# Patient Record
Sex: Female | Born: 1940
Health system: Southern US, Community
[De-identification: ages and names within clinical notes are randomized; demographics above are authoritative.]

## PROBLEM LIST (undated history)

## (undated) DIAGNOSIS — M1990 Unspecified osteoarthritis, unspecified site: Secondary | ICD-10-CM

## (undated) DIAGNOSIS — F32A Depression, unspecified: Secondary | ICD-10-CM

## (undated) DIAGNOSIS — F329 Major depressive disorder, single episode, unspecified: Secondary | ICD-10-CM

## (undated) DIAGNOSIS — I10 Essential (primary) hypertension: Secondary | ICD-10-CM

## (undated) DIAGNOSIS — K219 Gastro-esophageal reflux disease without esophagitis: Secondary | ICD-10-CM

## (undated) DIAGNOSIS — K602 Anal fissure, unspecified: Secondary | ICD-10-CM

## (undated) DIAGNOSIS — K5790 Diverticulosis of intestine, part unspecified, without perforation or abscess without bleeding: Secondary | ICD-10-CM

## (undated) DIAGNOSIS — M797 Fibromyalgia: Secondary | ICD-10-CM

## (undated) DIAGNOSIS — E669 Obesity, unspecified: Secondary | ICD-10-CM

## (undated) HISTORY — DX: Unspecified osteoarthritis, unspecified site: M19.90

## (undated) HISTORY — DX: Fibromyalgia: M79.7

## (undated) HISTORY — DX: Major depressive disorder, single episode, unspecified: F32.9

## (undated) HISTORY — DX: Obesity, unspecified: E66.9

## (undated) HISTORY — DX: Anal fissure, unspecified: K60.2

## (undated) HISTORY — DX: Essential (primary) hypertension: I10

## (undated) HISTORY — DX: Gastro-esophageal reflux disease without esophagitis: K21.9

## (undated) HISTORY — DX: Depression, unspecified: F32.A

## (undated) HISTORY — DX: Diverticulosis of intestine, part unspecified, without perforation or abscess without bleeding: K57.90

## (undated) HISTORY — PX: CATARACT EXTRACTION, BILATERAL: SHX1313

---

## 1968-10-10 HISTORY — PX: TUBAL LIGATION: SHX77

## 1968-10-10 HISTORY — PX: ABDOMINAL HYSTERECTOMY: SHX81

## 2002-12-18 ENCOUNTER — Encounter: Payer: Self-pay | Admitting: Internal Medicine

## 2003-12-19 ENCOUNTER — Encounter: Payer: Self-pay | Admitting: Internal Medicine

## 2003-12-19 LAB — CONVERTED CEMR LAB: Pap Smear: NORMAL

## 2004-05-10 HISTORY — PX: CHOLECYSTECTOMY: SHX55

## 2005-04-08 ENCOUNTER — Ambulatory Visit: Payer: Self-pay | Admitting: Internal Medicine

## 2005-04-28 ENCOUNTER — Ambulatory Visit: Payer: Self-pay | Admitting: Internal Medicine

## 2005-06-29 ENCOUNTER — Ambulatory Visit: Payer: Self-pay | Admitting: Internal Medicine

## 2005-11-05 ENCOUNTER — Ambulatory Visit: Payer: Self-pay | Admitting: Internal Medicine

## 2006-03-15 ENCOUNTER — Ambulatory Visit: Payer: Self-pay | Admitting: Internal Medicine

## 2006-05-13 ENCOUNTER — Ambulatory Visit: Payer: Self-pay | Admitting: Internal Medicine

## 2006-05-13 LAB — CONVERTED CEMR LAB: Hgb A1c MFr Bld: 7.8 % — ABNORMAL HIGH (ref 4.6–6.0)

## 2006-05-14 ENCOUNTER — Ambulatory Visit: Payer: Self-pay | Admitting: Internal Medicine

## 2006-07-16 ENCOUNTER — Telehealth (INDEPENDENT_AMBULATORY_CARE_PROVIDER_SITE_OTHER): Payer: Self-pay | Admitting: *Deleted

## 2006-07-19 ENCOUNTER — Telehealth (INDEPENDENT_AMBULATORY_CARE_PROVIDER_SITE_OTHER): Payer: Self-pay | Admitting: *Deleted

## 2006-08-10 ENCOUNTER — Telehealth: Payer: Self-pay | Admitting: Internal Medicine

## 2006-09-01 ENCOUNTER — Encounter: Payer: Self-pay | Admitting: Internal Medicine

## 2006-09-08 ENCOUNTER — Telehealth (INDEPENDENT_AMBULATORY_CARE_PROVIDER_SITE_OTHER): Payer: Self-pay | Admitting: *Deleted

## 2006-09-08 DIAGNOSIS — K439 Ventral hernia without obstruction or gangrene: Secondary | ICD-10-CM | POA: Insufficient documentation

## 2006-09-28 ENCOUNTER — Encounter: Payer: Self-pay | Admitting: Internal Medicine

## 2006-10-11 HISTORY — PX: VENTRAL HERNIA REPAIR: SHX424

## 2006-10-26 ENCOUNTER — Encounter: Payer: Self-pay | Admitting: Internal Medicine

## 2006-10-27 ENCOUNTER — Ambulatory Visit (HOSPITAL_COMMUNITY): Admission: RE | Admit: 2006-10-27 | Discharge: 2006-10-28 | Payer: Self-pay | Admitting: Surgery

## 2006-10-27 DIAGNOSIS — I10 Essential (primary) hypertension: Secondary | ICD-10-CM | POA: Insufficient documentation

## 2006-10-27 DIAGNOSIS — J45909 Unspecified asthma, uncomplicated: Secondary | ICD-10-CM | POA: Insufficient documentation

## 2006-11-12 ENCOUNTER — Telehealth (INDEPENDENT_AMBULATORY_CARE_PROVIDER_SITE_OTHER): Payer: Self-pay | Admitting: *Deleted

## 2006-12-13 ENCOUNTER — Ambulatory Visit: Payer: Self-pay | Admitting: Internal Medicine

## 2006-12-14 LAB — CONVERTED CEMR LAB
Albumin: 3.8 g/dL (ref 3.5–5.2)
BUN: 15 mg/dL (ref 6–23)
Basophils Absolute: 0.1 10*3/uL (ref 0.0–0.1)
Basophils Relative: 0.8 % (ref 0.0–1.0)
CO2: 28 meq/L (ref 19–32)
Calcium: 9.3 mg/dL (ref 8.4–10.5)
Chloride: 100 meq/L (ref 96–112)
Creatinine, Ser: 0.8 mg/dL (ref 0.4–1.2)
Eosinophils Absolute: 0.2 10*3/uL (ref 0.0–0.6)
Eosinophils Relative: 2.7 % (ref 0.0–5.0)
GFR calc Af Amer: 93 mL/min
GFR calc non Af Amer: 77 mL/min
Glucose, Bld: 139 mg/dL — ABNORMAL HIGH (ref 70–99)
HCT: 34.8 % — ABNORMAL LOW (ref 36.0–46.0)
Hemoglobin: 12 g/dL (ref 12.0–15.0)
Hgb A1c MFr Bld: 7.6 % — ABNORMAL HIGH (ref 4.6–6.0)
Lymphocytes Relative: 23.8 % (ref 12.0–46.0)
MCHC: 34.5 g/dL (ref 30.0–36.0)
MCV: 88.6 fL (ref 78.0–100.0)
Monocytes Absolute: 0.6 10*3/uL (ref 0.2–0.7)
Monocytes Relative: 7.3 % (ref 3.0–11.0)
Neutro Abs: 5 10*3/uL (ref 1.4–7.7)
Neutrophils Relative %: 65.4 % (ref 43.0–77.0)
Phosphorus: 3.4 mg/dL (ref 2.3–4.6)
Platelets: 232 10*3/uL (ref 150–400)
Potassium: 4.1 meq/L (ref 3.5–5.1)
RBC: 3.93 M/uL (ref 3.87–5.11)
RDW: 13.5 % (ref 11.5–14.6)
Sodium: 137 meq/L (ref 135–145)
TSH: 2.1 microintl units/mL (ref 0.35–5.50)
WBC: 7.8 10*3/uL (ref 4.5–10.5)

## 2007-01-18 ENCOUNTER — Ambulatory Visit: Payer: Self-pay | Admitting: Family Medicine

## 2007-01-18 ENCOUNTER — Telehealth: Payer: Self-pay | Admitting: Internal Medicine

## 2007-02-18 ENCOUNTER — Telehealth (INDEPENDENT_AMBULATORY_CARE_PROVIDER_SITE_OTHER): Payer: Self-pay | Admitting: *Deleted

## 2007-02-22 ENCOUNTER — Encounter: Payer: Self-pay | Admitting: Internal Medicine

## 2007-03-09 ENCOUNTER — Encounter (INDEPENDENT_AMBULATORY_CARE_PROVIDER_SITE_OTHER): Payer: Self-pay | Admitting: *Deleted

## 2007-03-10 ENCOUNTER — Ambulatory Visit: Payer: Self-pay | Admitting: Internal Medicine

## 2007-05-30 ENCOUNTER — Telehealth (INDEPENDENT_AMBULATORY_CARE_PROVIDER_SITE_OTHER): Payer: Self-pay | Admitting: *Deleted

## 2007-06-20 ENCOUNTER — Ambulatory Visit: Payer: Self-pay | Admitting: Internal Medicine

## 2007-06-20 DIAGNOSIS — F39 Unspecified mood [affective] disorder: Secondary | ICD-10-CM | POA: Insufficient documentation

## 2007-06-21 LAB — CONVERTED CEMR LAB
Calcium: 9.2 mg/dL (ref 8.4–10.5)
Chloride: 107 meq/L (ref 96–112)
Eosinophils Absolute: 0.2 10*3/uL (ref 0.0–0.7)
GFR calc Af Amer: 81 mL/min
GFR calc non Af Amer: 67 mL/min
HCT: 33.4 % — ABNORMAL LOW (ref 36.0–46.0)
Hgb A1c MFr Bld: 7.8 % — ABNORMAL HIGH (ref 4.6–6.0)
MCV: 88.7 fL (ref 78.0–100.0)
Monocytes Absolute: 0.4 10*3/uL (ref 0.1–1.0)
Monocytes Relative: 5.8 % (ref 3.0–12.0)
Neutrophils Relative %: 64.4 % (ref 43.0–77.0)
Phosphorus: 2.9 mg/dL (ref 2.3–4.6)
Platelets: 205 10*3/uL (ref 150–400)
Potassium: 3.8 meq/L (ref 3.5–5.1)
RDW: 13.9 % (ref 11.5–14.6)
Sodium: 139 meq/L (ref 135–145)
TSH: 2.22 microintl units/mL (ref 0.35–5.50)
WBC: 6.8 10*3/uL (ref 4.5–10.5)

## 2007-07-12 ENCOUNTER — Telehealth (INDEPENDENT_AMBULATORY_CARE_PROVIDER_SITE_OTHER): Payer: Self-pay | Admitting: *Deleted

## 2007-07-25 ENCOUNTER — Ambulatory Visit: Payer: Self-pay | Admitting: Internal Medicine

## 2007-07-28 ENCOUNTER — Telehealth (INDEPENDENT_AMBULATORY_CARE_PROVIDER_SITE_OTHER): Payer: Self-pay | Admitting: *Deleted

## 2007-07-28 ENCOUNTER — Encounter: Payer: Self-pay | Admitting: Internal Medicine

## 2007-11-01 ENCOUNTER — Encounter: Payer: Self-pay | Admitting: Internal Medicine

## 2007-11-08 ENCOUNTER — Telehealth: Payer: Self-pay | Admitting: Internal Medicine

## 2007-11-18 ENCOUNTER — Ambulatory Visit: Payer: Self-pay | Admitting: Internal Medicine

## 2007-11-21 LAB — CONVERTED CEMR LAB
Albumin: 3.7 g/dL (ref 3.5–5.2)
Basophils Absolute: 0 10*3/uL (ref 0.0–0.1)
CO2: 26 meq/L (ref 19–32)
Chloride: 101 meq/L (ref 96–112)
GFR calc Af Amer: 81 mL/min
GFR calc non Af Amer: 67 mL/min
Hgb A1c MFr Bld: 8.6 % — ABNORMAL HIGH (ref 4.6–6.0)
Lymphocytes Relative: 22.5 % (ref 12.0–46.0)
MCHC: 33.7 g/dL (ref 30.0–36.0)
Neutro Abs: 4.7 10*3/uL (ref 1.4–7.7)
Neutrophils Relative %: 67.1 % (ref 43.0–77.0)
Platelets: 194 10*3/uL (ref 150–400)
Potassium: 4 meq/L (ref 3.5–5.1)
RDW: 13.4 % (ref 11.5–14.6)
Sodium: 136 meq/L (ref 135–145)

## 2007-12-11 DIAGNOSIS — K5732 Diverticulitis of large intestine without perforation or abscess without bleeding: Secondary | ICD-10-CM

## 2007-12-15 ENCOUNTER — Ambulatory Visit: Payer: Self-pay | Admitting: Gastroenterology

## 2007-12-26 ENCOUNTER — Ambulatory Visit: Payer: Self-pay | Admitting: Gastroenterology

## 2007-12-27 LAB — CONVERTED CEMR LAB
Saturation Ratios: 15.5 % — ABNORMAL LOW (ref 20.0–50.0)
Vitamin B-12: 201 pg/mL — ABNORMAL LOW (ref 211–911)

## 2008-01-03 ENCOUNTER — Ambulatory Visit: Payer: Self-pay | Admitting: Gastroenterology

## 2008-01-03 LAB — HM COLONOSCOPY

## 2008-01-18 ENCOUNTER — Telehealth (INDEPENDENT_AMBULATORY_CARE_PROVIDER_SITE_OTHER): Payer: Self-pay | Admitting: *Deleted

## 2008-01-25 ENCOUNTER — Telehealth: Payer: Self-pay | Admitting: Internal Medicine

## 2008-02-13 ENCOUNTER — Encounter: Payer: Self-pay | Admitting: Internal Medicine

## 2008-05-18 ENCOUNTER — Ambulatory Visit: Payer: Self-pay | Admitting: Internal Medicine

## 2008-05-18 DIAGNOSIS — C4490 Unspecified malignant neoplasm of skin, unspecified: Secondary | ICD-10-CM

## 2008-05-18 DIAGNOSIS — J309 Allergic rhinitis, unspecified: Secondary | ICD-10-CM | POA: Insufficient documentation

## 2008-05-22 LAB — CONVERTED CEMR LAB
Basophils Relative: 0.3 % (ref 0.0–3.0)
Creatinine,U: 206 mg/dL
GFR calc non Af Amer: 58.83 mL/min (ref >60)
Glucose, Bld: 157 mg/dL — ABNORMAL HIGH (ref 70–99)
Hemoglobin: 11.5 g/dL — ABNORMAL LOW (ref 12.0–15.0)
Lymphocytes Relative: 20.9 % (ref 12.0–46.0)
MCHC: 34.3 g/dL (ref 30.0–36.0)
Microalb Creat Ratio: 3.9 mg/g (ref 0.0–30.0)
Monocytes Relative: 6.7 % (ref 3.0–12.0)
Neutro Abs: 5.5 10*3/uL (ref 1.4–7.7)
Potassium: 3.8 meq/L (ref 3.5–5.1)
RBC: 3.86 M/uL — ABNORMAL LOW (ref 3.87–5.11)
Sodium: 141 meq/L (ref 135–145)
TSH: 1.98 microintl units/mL (ref 0.35–5.50)

## 2008-05-23 ENCOUNTER — Telehealth: Payer: Self-pay | Admitting: Internal Medicine

## 2008-07-12 ENCOUNTER — Telehealth: Payer: Self-pay | Admitting: Internal Medicine

## 2008-07-18 ENCOUNTER — Encounter: Payer: Self-pay | Admitting: Internal Medicine

## 2008-08-28 ENCOUNTER — Ambulatory Visit: Payer: Self-pay | Admitting: Family Medicine

## 2008-10-11 ENCOUNTER — Encounter: Payer: Self-pay | Admitting: Internal Medicine

## 2008-11-13 ENCOUNTER — Ambulatory Visit: Payer: Self-pay | Admitting: Internal Medicine

## 2008-11-14 LAB — CONVERTED CEMR LAB
AST: 32 units/L (ref 0–37)
Albumin: 3.6 g/dL (ref 3.5–5.2)
BUN: 18 mg/dL (ref 6–23)
Basophils Absolute: 0 10*3/uL (ref 0.0–0.1)
Basophils Relative: 0.5 % (ref 0.0–3.0)
CO2: 28 meq/L (ref 19–32)
Chloride: 106 meq/L (ref 96–112)
Cholesterol: 142 mg/dL (ref 0–200)
GFR calc non Af Amer: 66.34 mL/min (ref >60)
HCT: 33 % — ABNORMAL LOW (ref 36.0–46.0)
Hemoglobin: 11.1 g/dL — ABNORMAL LOW (ref 12.0–15.0)
Lymphs Abs: 1.7 10*3/uL (ref 0.7–4.0)
MCHC: 33.5 g/dL (ref 30.0–36.0)
Monocytes Relative: 7.6 % (ref 3.0–12.0)
Neutro Abs: 4.2 10*3/uL (ref 1.4–7.7)
RDW: 14.7 % — ABNORMAL HIGH (ref 11.5–14.6)
Total Protein: 7.5 g/dL (ref 6.0–8.3)
Triglycerides: 144 mg/dL (ref 0.0–149.0)

## 2009-02-13 ENCOUNTER — Ambulatory Visit: Payer: Self-pay | Admitting: Family Medicine

## 2009-02-13 DIAGNOSIS — K219 Gastro-esophageal reflux disease without esophagitis: Secondary | ICD-10-CM | POA: Insufficient documentation

## 2009-03-20 ENCOUNTER — Ambulatory Visit: Payer: Self-pay | Admitting: Internal Medicine

## 2009-03-21 ENCOUNTER — Telehealth: Payer: Self-pay | Admitting: Internal Medicine

## 2009-03-22 ENCOUNTER — Encounter: Payer: Self-pay | Admitting: Internal Medicine

## 2009-03-23 ENCOUNTER — Encounter: Payer: Self-pay | Admitting: Internal Medicine

## 2009-05-20 ENCOUNTER — Ambulatory Visit: Payer: Self-pay | Admitting: Internal Medicine

## 2009-06-07 ENCOUNTER — Telehealth: Payer: Self-pay | Admitting: Internal Medicine

## 2009-07-18 ENCOUNTER — Telehealth: Payer: Self-pay | Admitting: Internal Medicine

## 2009-07-30 ENCOUNTER — Encounter: Payer: Self-pay | Admitting: Internal Medicine

## 2009-08-15 ENCOUNTER — Ambulatory Visit: Payer: Self-pay | Admitting: Internal Medicine

## 2009-08-15 LAB — HM DIABETES EYE EXAM

## 2009-08-19 LAB — CONVERTED CEMR LAB
ALT: 30 units/L (ref 0–35)
Albumin: 4 g/dL (ref 3.5–5.2)
BUN: 21 mg/dL (ref 6–23)
Basophils Relative: 0.3 % (ref 0.0–3.0)
CO2: 28 meq/L (ref 19–32)
Calcium: 9.3 mg/dL (ref 8.4–10.5)
Chloride: 104 meq/L (ref 96–112)
Eosinophils Relative: 1.2 % (ref 0.0–5.0)
Hgb A1c MFr Bld: 7.3 % — ABNORMAL HIGH (ref 4.6–6.5)
Lymphocytes Relative: 18.8 % (ref 12.0–46.0)
MCV: 91 fL (ref 78.0–100.0)
Monocytes Relative: 4.5 % (ref 3.0–12.0)
Neutrophils Relative %: 75.2 % (ref 43.0–77.0)
Platelets: 219 10*3/uL (ref 150.0–400.0)
Potassium: 4.3 meq/L (ref 3.5–5.1)
RBC: 3.5 M/uL — ABNORMAL LOW (ref 3.87–5.11)
Sodium: 142 meq/L (ref 135–145)
Total Bilirubin: 0.3 mg/dL (ref 0.3–1.2)
Total Protein: 8 g/dL (ref 6.0–8.3)
Vitamin B-12: >1500 pg/mL — ABNORMAL HIGH (ref 211–911)
WBC: 8.9 10*3/uL (ref 4.5–10.5)

## 2009-10-17 ENCOUNTER — Ambulatory Visit: Payer: Self-pay | Admitting: Internal Medicine

## 2010-02-13 ENCOUNTER — Ambulatory Visit
Admission: RE | Admit: 2010-02-13 | Discharge: 2010-02-13 | Payer: Self-pay | Source: Home / Self Care | Attending: Family Medicine | Admitting: Family Medicine

## 2010-02-21 ENCOUNTER — Ambulatory Visit
Admission: RE | Admit: 2010-02-21 | Discharge: 2010-02-21 | Payer: Self-pay | Source: Home / Self Care | Attending: Internal Medicine | Admitting: Internal Medicine

## 2010-02-21 ENCOUNTER — Other Ambulatory Visit: Payer: Self-pay | Admitting: Internal Medicine

## 2010-02-21 LAB — RENAL FUNCTION PANEL
Albumin: 3.6 g/dL (ref 3.5–5.2)
BUN: 21 mg/dL (ref 6–23)
CO2: 25 mEq/L (ref 19–32)
Calcium: 9.4 mg/dL (ref 8.4–10.5)
Chloride: 99 mEq/L (ref 96–112)
Creatinine, Ser: 1 mg/dL (ref 0.4–1.2)
GFR: 58.52 mL/min — ABNORMAL LOW (ref >60.00)
Glucose, Bld: 247 mg/dL — ABNORMAL HIGH (ref 70–99)
Phosphorus: 3.1 mg/dL (ref 2.3–4.6)
Potassium: 4.1 mEq/L (ref 3.5–5.1)
Sodium: 136 mEq/L (ref 135–145)

## 2010-02-21 LAB — CBC WITH DIFFERENTIAL/PLATELET
Basophils Absolute: 0 10*3/uL (ref 0.0–0.1)
Basophils Relative: 0.5 % (ref 0.0–3.0)
Eosinophils Absolute: 0.2 10*3/uL (ref 0.0–0.7)
Eosinophils Relative: 2.2 % (ref 0.0–5.0)
HCT: 31 % — ABNORMAL LOW (ref 36.0–46.0)
Hemoglobin: 10.4 g/dL — ABNORMAL LOW (ref 12.0–15.0)
Lymphocytes Relative: 19.1 % (ref 12.0–46.0)
Lymphs Abs: 1.5 10*3/uL (ref 0.7–4.0)
MCHC: 33.7 g/dL (ref 30.0–36.0)
MCV: 88.4 fl (ref 78.0–100.0)
Monocytes Absolute: 0.4 10*3/uL (ref 0.1–1.0)
Monocytes Relative: 5.4 % (ref 3.0–12.0)
Neutro Abs: 5.8 10*3/uL (ref 1.4–7.7)
Neutrophils Relative %: 72.8 % (ref 43.0–77.0)
Platelets: 204 10*3/uL (ref 150.0–400.0)
RBC: 3.5 Mil/uL — ABNORMAL LOW (ref 3.87–5.11)
RDW: 15.7 % — ABNORMAL HIGH (ref 11.5–14.6)
WBC: 8 10*3/uL (ref 4.5–10.5)

## 2010-02-21 LAB — HEPATIC FUNCTION PANEL
ALT: 25 U/L (ref 0–35)
AST: 28 U/L (ref 0–37)
Albumin: 3.6 g/dL (ref 3.5–5.2)
Alkaline Phosphatase: 64 U/L (ref 39–117)
Bilirubin, Direct: 0.1 mg/dL (ref 0.0–0.3)
Total Bilirubin: 0.4 mg/dL (ref 0.3–1.2)
Total Protein: 7.4 g/dL (ref 6.0–8.3)

## 2010-02-21 LAB — TSH: TSH: 1.84 u[IU]/mL (ref 0.35–5.50)

## 2010-02-21 LAB — HEMOGLOBIN A1C: Hgb A1c MFr Bld: 8.1 % — ABNORMAL HIGH (ref 4.6–6.5)

## 2010-02-25 ENCOUNTER — Telehealth: Payer: Self-pay | Admitting: Internal Medicine

## 2010-02-25 DIAGNOSIS — N814 Uterovaginal prolapse, unspecified: Secondary | ICD-10-CM | POA: Insufficient documentation

## 2010-03-11 NOTE — Medication Information (Signed)
Summary: Approval for Celebrex/[employer]  Approval for Celebrex/[employer]   Imported By: Lanelle Bal 03/29/2009 12:51:39  _____________________________________________________________________  External Attachment:    Type:   Image     Comment:   External Document

## 2010-03-11 NOTE — Progress Notes (Signed)
Summary: regarding ogen dose  Phone Note Call from Patient Call back at Home Phone [phone]   Caller: Patient Call For: Cindee Salt MD Summary of Call: Pt has been on ogen .75 but when refill was sent in the other day it was for 1 mg and the pt is asking why the dose was changed.  I didnt see anything noted in her chart.  Please advise. Uses [facility] [address]. Initial call taken by: Lowella Petties CMA,  June 07, 2009 9:42 AM  Follow-up for Phone Call        appears that the med was changed in the chart ogen is estropipate and it comes in a 0.75mg  dose but the estradiol doesn't  Okay to change back to estropipate at 0.75mg  Remind her that she should take the lowest dose that keeps symptoms away----so she should skip some days if no problems (slowly wean down to the lowest effective dose) Follow-up by: Cindee Salt MD,  June 07, 2009 10:08 AM  Additional Follow-up for Phone Call Additional follow up Details #1::        Patient says that she just spend money buying a 3 month supply of the 1mg  tablets and she doubts the pharmacy will take them back.  What do you want her to do?  Please advise.  Linde Gillis CMA Duncan Dull)  June 07, 2009 12:46 PM   Okay to use those first, and then switch back if she would like. Either way, she should skip days and find the lowest dose that keeps her from having menopausal symptoms. I recommend cutting back 1 tab a week each month (ie--take it 6 days per week now, go down to 5 days per week in 1 month, the 4 days per week, etc) Cindee Salt MD  June 07, 2009 12:50 PM   Patient advised as instructed. Additional Follow-up by: Linde Gillis CMA Duncan Dull),  June 07, 2009 12:56 PM

## 2010-03-11 NOTE — Progress Notes (Signed)
Summary: Prior Authorization for Celebrex 200mg   Phone Note From Pharmacy Call back at [phone]   Call placed by: Linde Gillis CMA Duncan Dull),  March 21, 2009 11:24 AM Call placed to: [facility] Caller: [facility] [address] Call For: Dr. Alphonsus Sias  Summary of Call: Received faxed form from pharmacy stating that prior authorization is needed for Celebrex 200mg .  Called [employer] at [phone] and request forms through automated system.  Forms will be sent today.  Linde Gillis CMA Duncan Dull)  March 21, 2009 11:26 AM   Received forms, forms in your IN box.   Initial call taken by: Linde Gillis CMA Duncan Dull),  March 21, 2009 4:17 PM  Follow-up for Phone Call        form done Please fill in rest and send Follow-up by: Cindee Salt MD,  March 22, 2009 8:01 AM  Additional Follow-up for Phone Call Additional follow up Details #1::        forms sent back to [facility] Additional Follow-up by: Mervin Hack CMA Duncan Dull),  March 22, 2009 9:09 AM

## 2010-03-11 NOTE — Progress Notes (Signed)
Summary: refill request for zanaflex  Phone Note Refill Request Call back at Home Phone [phone] Message from:  Patient  Refills Requested: Medication #1:  ZANAFLEX 4 MG  CAPS Take one by mouth at bedtime as needed Please send to [facility] [address].  She usually gets this [place] but she has run out.  Initial call taken by: Lowella Petties CMA,  July 18, 2009 12:20 PM  Follow-up for Phone Call        okay to send #90 x 0 Follow-up by: Cindee Salt MD,  July 18, 2009 12:59 PM  Additional Follow-up for Phone Call Additional follow up Details #1::        Rx faxed to pharmacy, Spoke with patient and advised results.  Additional Follow-up by: Mervin Hack CMA Duncan Dull),  July 18, 2009 2:53 PM    Prescriptions: ZANAFLEX 4 MG  CAPS (TIZANIDINE HCL) Take one by mouth at bedtime as needed  #90 x 0   Entered by:   Mervin Hack CMA (AAMA)   Authorized by:   Cindee Salt MD   Signed by:   Mervin Hack CMA (AAMA) on 07/18/2009   Method used:   Electronically to        [facility] [address] (retail)       [address]       [place], [place]  [address]       Ph: [phone]       Fax: [phone]   RxID:   [id number]

## 2010-03-11 NOTE — Assessment & Plan Note (Signed)
Summary: 1 MONTH FOLLOW UP PER BILLIE/[facility]   Vital Signs:  Patient profile:   70 year old female Weight:      227 pounds Temp:     98.5 degrees F oral Pulse rate:   60 / minute Pulse rhythm:   irregular BP sitting:   140 / 68  (left arm) Cuff size:   large  Vitals Entered By: Mervin Hack CMA Duncan Dull) (March 20, 2009 9:05 AM) CC: 1 month follow-up   History of Present Illness: Has changed her diet--eating better Lower abdominal pain is completely gone  Felt the nexium really helped more back on prilosec for the past 3 days and having some more symptoms concerned that she cannot afford the nexium  Eating Christy Gentles thinks this may have helped her bowels as well  No throat symptoms or voice changes mostly just substernal pain----mostly dependent on eating Not awakening with this pain  Allergies: 1)  * Lisinopril 2)  Prozac (Fluoxetine Hcl) 3)  Sertraline Hcl (Sertraline Hcl)  Past History:  Past medical, surgical, family and social histories (including risk factors) reviewed for relevance to current acute and chronic problems.  Past Medical History: Reviewed history from 05/18/2008 and no changes required. Asthma Diabetes mellitus, type II Hypertension Fibromyalgia Osteoarthritis Depression Anal Fissure Arthritis Obesity GERD Allergic rhinitis  Past Surgical History: Reviewed history from 12/15/2007 and no changes required. Cataracts- bilat Cholecystectomy 04/06 Hysterectomy 1970's Tubal ligation 1970's Ventral hernia repair 10/2006 ([facility]) DEXA- normal ( except osteopenia at [facility]) 11/04  Family History: Reviewed history from 12/15/2007 and no changes required. Father: Died at age 66, MI Mother: Died at age 14, kidney cancer 39 Brothers-- one deceased with lung cancer, one deceased with liver cancer, one living CAD in  Dad No HTN DM on  Mom's side No breast or colon cancer No FH of Colon Cancer:  Social History: Reviewed history  from 10/26/2006 and no changes required. Marital Status: Divorced Children: 3 Occupation: Homemaker Never Smoked Alcohol use-no  Review of Systems       weight is down about 2# since last visit Bowels are normal  Physical Exam  General:  alert and normal appearance.   Neck:  supple and no masses.   Lungs:  normal respiratory effort and normal breath sounds.   Heart:  normal rate, regular rhythm, no murmur, and no gallop.   Abdomen:  soft, non-tender, and no masses.     Impression & Recommendations:  Problem # 1:  GERD (ICD-530.81) Assessment Improved will increase the prilosec to two times a day  if that is not effective, will need to change to nexium  The following medications were removed from the medication list:    Prilosec 20 Mg Cpdr (Omeprazole) ..... One tablet by mouth once daily Her updated medication list for this problem includes:    Omeprazole 20 Mg Cpdr (Omeprazole) .Marland Kitchen... 1 tab by mouth two times a day for acid reflux  Complete Medication List: 1)  Cardura 8 Mg Tabs (Doxazosin mesylate) .... Take 1/2 tablet by mouth twice a day 2)  Metformin Hcl 1000 Mg Tabs (Metformin hcl) .Marland Kitchen.. 1 tablet by mouth twice a day 3)  Ogen 0.625 0.75 Mg Tabs (Estropipate) .... Take one by mouth once a day 4)  Zyrtec Allergy 10 Mg Tabs (Cetirizine hcl) .... Take one by mouth once a day as needed 5)  Zanaflex 4 Mg Caps (Tizanidine hcl) .... Take one by mouth at bedtime as needed 6)  Celebrex 200 Mg Caps (  Celecoxib) .... Take one by mouth once a day 7)  Aspirin 81 Mg Tabs (Aspirin) .... Take one by mouth once a day 8)  Hyzaar 100-25 Mg Tabs (Losartan potassium-hctz) .... Take one by mouth once a day 9)  Effexor Xr 75 Mg Cp24 (Venlafaxine hcl) .Marland Kitchen.. 1 daily or as directed 10)  Glipizide Xl 5 Mg Xr24h-tab (Glipizide) .... Take 1 tablet every morning befor breakfast 11)  Neurontin 100 Mg Caps (Gabapentin) .... Take 1 tablet by mouth two times a day 12)  Calcium 500 Mg Tabs (Calcium  carbonate) .... Take 1 by mouth once daily 13)  Potassium 75 Mg Tabs (Potassium) .... Take 1 by mouth once daily 14)  Vitamin D3 1000 Unit Tabs (Cholecalciferol) .... Take 1 by mouth once daily 15)  Vitamin B-12 500 Mcg Tabs (Cyanocobalamin) .... Take 1 by mouth once daily 16)  Omeprazole 20 Mg Cpdr (Omeprazole) .Marland Kitchen.. 1 tab by mouth two times a day for acid reflux  Patient Instructions: 1)  Please cancel March 1st appt 2)  Please schedule a follow-up appointment in 2 months.  Prescriptions: CELEBREX 200 MG  CAPS (CELECOXIB) Take one by mouth once a day  #30 x 1   Entered and Authorized by:   Cindee Salt MD   Signed by:   Cindee Salt MD on 03/20/2009   Method used:   Electronically to        [facility] [address] (retail)       [address]       [place], [place]  [address]       Ph: [phone]       Fax: [phone]   RxID:   [id number] OMEPRAZOLE 20 MG CPDR (OMEPRAZOLE) 1 tab by mouth two times a day for acid reflux  #60 x 11   Entered and Authorized by:   Cindee Salt MD   Signed by:   Cindee Salt MD on 03/20/2009   Method used:   Electronically to        [facility] [address] (retail)       [address]       [place], [place]  [address]       Ph: [phone]       Fax: [phone]   RxID:   [id number]   Current Allergies (reviewed today): * LISINOPRIL PROZAC (FLUOXETINE HCL) SERTRALINE HCL (SERTRALINE HCL)

## 2010-03-11 NOTE — Medication Information (Signed)
Summary: Prescriber Response Form/[employer]  Prescriber Response Form/[employer]   Imported By: Lester  08/08/2009 11:48:54  _____________________________________________________________________  External Attachment:    Type:   Image     Comment:   External Document

## 2010-03-11 NOTE — Medication Information (Signed)
Summary: Prior Authorization for Celebrex/[employer]  Prior Authorization for Celebrex/[employer]   Imported By: Lanelle Bal 03/25/2009 10:34:17  _____________________________________________________________________  External Attachment:    Type:   Image     Comment:   External Document  Appended Document: Prior Authorization for Celebrex/[employer] Spoke with Robin at [facility], Celebrex 200mg  approved from 03/22/2009 through 03/23/2011.  Pharmacy and patient notified.

## 2010-03-11 NOTE — Assessment & Plan Note (Signed)
Summary: ACID REFLUX/DS   Vital Signs:  Patient profile:   70 year old female Height:      64 inches Weight:      229.50 pounds BMI:     39.54 Temp:     98.4 degrees F oral Pulse rate:   72 / minute Pulse rhythm:   irregular BP sitting:   126 / 84  (left arm) Cuff size:   large  Vitals Entered By: Lewanda Rife LPN (February 13, 2009 9:17 AM)  Primary Care Provider:  Nada Libman  CC:  acid reflux and upper abdominal pain.  History of Present Illness: Here for discomfort in upper abd  and reflux signs--onset x 1+ wk--sudden onset in lower abd and now sore in lateral sides and to back--indigestion started few days ago, not there at first --taking prilosec 20 once daily--not helping any more --BMs nl --Colonoscopy 11/09--diverticulosis, little knowledge of this entity or how to treat if flair  Allergies: 1)  * Lisinopril 2)  Prozac (Fluoxetine Hcl) 3)  Sertraline Hcl (Sertraline Hcl)  Past History:  Past Medical History: Reviewed history from 05/18/2008 and no changes required. Asthma Diabetes mellitus, type II Hypertension Fibromyalgia Osteoarthritis Depression Anal Fissure Arthritis Obesity GERD Allergic rhinitis  Review of Systems CV:  Denies chest pain or discomfort and palpitations. Resp:  Denies cough, shortness of breath, and wheezing. GI:  Complains of abdominal pain and indigestion; denies bloody stools, change in bowel habits, constipation, diarrhea, nausea, and vomiting.  Physical Exam  General:  alert, well-developed, well-nourished, well-hydrated, and overweight-appearing.  NAD Lungs:  normal respiratory effort, no intercostal retractions, no accessory muscle use, and normal breath sounds.   Abdomen:  soft, normal bowel sounds, no distention, no masses, no guarding, no rebound tenderness, no abdominal hernia, no inguinal hernia, no hepatomegaly, and no splenomegaly.   generalized tenderness throughout abd except over suprapubic area Inguinal  Nodes:  no R inguinal adenopathy and no L inguinal adenopathy.   Psych:  normally interactive and good eye contact.     Impression & Recommendations:  Problem # 1:  ABDOMINAL PAIN, GENERALIZED (ICD-789.07) Assessment New with review of colonoscopy find thst she has diverticulosis and that she has been eating more nuts over Holiday than usual as not acute, will not treat, asked to avoid large amts of nuts, seeds and foods with kernels--discussed see back if not improved to resolved in 1 wk  Problem # 2:  GERD (ICD-530.81) Assessment: Deteriorated not controlled now on prilosec--will try on Nexium, gave samples x30mo and reviewed how to take to see Dr Alphonsus Sias in 1 mo Her updated medication list for this problem includes:    Prilosec 20 Mg Cpdr (Omeprazole) ..... One tablet by mouth once daily  Problem # 3:  HYPERTENSION (ICD-401.9) Assessment: Unchanged stable on current meds--continue Her updated medication list for this problem includes:    Cardura 8 Mg Tabs (Doxazosin mesylate) .Marland Kitchen... Take 1/2 tablet by mouth twice a day    Hyzaar 100-25 Mg Tabs (Losartan potassium-hctz) .Marland Kitchen... Take one by mouth once a day  BP today: 126/84 Prior BP: 138/78 (11/13/2008)  Labs Reviewed: K+: 3.9 (11/13/2008) Creat: : 0.9 (11/13/2008)   Chol: 142 (11/13/2008)   HDL: 38.90 (11/13/2008)   LDL: 74 (11/13/2008)   TG: 144.0 (11/13/2008)  Complete Medication List: 1)  Cardura 8 Mg Tabs (Doxazosin mesylate) .... Take 1/2 tablet by mouth twice a day 2)  Metformin Hcl 1000 Mg Tabs (Metformin hcl) .Marland Kitchen.. 1 tablet by mouth twice a day  3)  Ogen 0.625 0.75 Mg Tabs (Estropipate) .... Take one by mouth once a day 4)  Zyrtec Allergy 10 Mg Tabs (Cetirizine hcl) .... Take one by mouth once a day as needed 5)  Zanaflex 4 Mg Caps (Tizanidine hcl) .... Take one by mouth at bedtime as needed 6)  Celebrex 200 Mg Caps (Celecoxib) .... Take one by mouth once a day 7)  Aspirin 81 Mg Tabs (Aspirin) .... Take one by mouth once a  day 8)  Hyzaar 100-25 Mg Tabs (Losartan potassium-hctz) .... Take one by mouth once a day 9)  Effexor Xr 75 Mg Cp24 (Venlafaxine hcl) .Marland Kitchen.. 1 daily or as directed 10)  Glipizide Xl 5 Mg Xr24h-tab (Glipizide) .... Take 1 tablet every morning befor breakfast 11)  Prilosec 20 Mg Cpdr (Omeprazole) .... One tablet by mouth once daily 12)  Neurontin 100 Mg Caps (Gabapentin) .... Take 1 tablet by mouth two times a day 13)  Calcium 500 Mg Tabs (Calcium carbonate) .... Take 1 by mouth once daily 14)  Potassium 75 Mg Tabs (Potassium) .... Take 1 by mouth once daily 15)  Vitamin D3 1000 Unit Tabs (Cholecalciferol) .... Take 1 by mouth once daily 16)  Vitamin B-12 500 Mcg Tabs (Cyanocobalamin) .... Take 1 by mouth once daily  Patient Instructions: 1)  Please schedule a follow-up appointment in 1 month--Dr Letvak.  Current Allergies (reviewed today): * LISINOPRIL PROZAC (FLUOXETINE HCL) SERTRALINE HCL (SERTRALINE HCL)

## 2010-03-11 NOTE — Assessment & Plan Note (Signed)
Summary: 2 M F/U DLO   Vital Signs:  Patient profile:   70 year old female Weight:      229 pounds Temp:     98 degrees F oral Pulse rate:   86 / minute Pulse rhythm:   regular BP sitting:   130 / 72  (left arm) Cuff size:   large  Vitals Entered By: Mervin Hack CMA Duncan Dull) (May 20, 2009 11:17 AM) CC: knee injection   History of Present Illness: ongoing left knee pain hard time walking daughter bedridden and doing more for her currently hospitalized for ongoing seizures  has gotten relief in past from injections last was 10/10  Allergies: 1)  * Lisinopril 2)  Prozac (Fluoxetine Hcl) 3)  Sertraline Hcl (Sertraline Hcl)  Past History:  Past medical, surgical, family and social histories (including risk factors) reviewed for relevance to current acute and chronic problems.  Past Medical History: Reviewed history from 05/18/2008 and no changes required. Asthma Diabetes mellitus, type II Hypertension Fibromyalgia Osteoarthritis Depression Anal Fissure Arthritis Obesity GERD Allergic rhinitis  Past Surgical History: Reviewed history from 12/15/2007 and no changes required. Cataracts- bilat Cholecystectomy 04/06 Hysterectomy 1970's Tubal ligation 1970's Ventral hernia repair 10/2006 ([facility]) DEXA- normal ( except osteopenia at [facility]) 11/04  Family History: Reviewed history from 12/15/2007 and no changes required. Father: Died at age 34, MI Mother: Died at age 65, kidney cancer 16 Brothers-- one deceased with lung cancer, one deceased with liver cancer, one living CAD in  Dad No HTN DM on  Mom's side No breast or colon cancer No FH of Colon Cancer:  Social History: Reviewed history from 10/26/2006 and no changes required. Marital Status: Divorced Children: 3 Occupation: Homemaker Never Smoked Alcohol use-no   Impression & Recommendations:  Problem # 1:  LOC OSTEOARTHROS NOT SPEC PRIM/SEC LOWER LEG (ICD-715.36) Assessment Comment  Only  procedure  Sterile prep medial approach to left knee 2cc plain 2% lido local 40mg  kenalog/8cc 2% lido instilled in left knee tolerated well with reduction in pain discussed home care  Orders: Joint Aspirate / Injection, Large (20610) Kenalog 10 mg inj (J3301)  Complete Medication List: 1)  Cardura 8 Mg Tabs (Doxazosin mesylate) .... Take 1/2 tablet by mouth twice a day 2)  Metformin Hcl 1000 Mg Tabs (Metformin hcl) .Marland Kitchen.. 1 tablet by mouth twice a day 3)  Ogen 0.625 0.75 Mg Tabs (Estropipate) .... Take one by mouth once a day 4)  Zyrtec Allergy 10 Mg Tabs (Cetirizine hcl) .... Take one by mouth once a day as needed 5)  Zanaflex 4 Mg Caps (Tizanidine hcl) .... Take one by mouth at bedtime as needed 6)  Celebrex 200 Mg Caps (Celecoxib) .... Take one by mouth once a day 7)  Aspirin 81 Mg Tabs (Aspirin) .... Take one by mouth once a day 8)  Hyzaar 100-25 Mg Tabs (Losartan potassium-hctz) .... Take one by mouth once a day 9)  Effexor Xr 75 Mg Cp24 (Venlafaxine hcl) .Marland Kitchen.. 1 daily or as directed 10)  Glipizide Xl 5 Mg Xr24h-tab (Glipizide) .... Take 1 tablet every morning befor breakfast 11)  Neurontin 100 Mg Caps (Gabapentin) .... Take 1 tablet by mouth two times a day 12)  Calcium 500 Mg Tabs (Calcium carbonate) .... Take 1 by mouth once daily 13)  Potassium 75 Mg Tabs (Potassium) .... Take 1 by mouth once daily 14)  Vitamin D3 1000 Unit Tabs (Cholecalciferol) .... Take 1 by mouth once daily 15)  Vitamin B-12 500 Mcg Tabs (  Cyanocobalamin) .... Take 1 by mouth once daily 16)  Omeprazole 20 Mg Cpdr (Omeprazole) .Marland Kitchen.. 1 tab by mouth two times a day for acid reflux  Patient Instructions: 1)  Please schedule a follow-up appointment in 2-3  months.   Current Allergies (reviewed today): * LISINOPRIL PROZAC (FLUOXETINE HCL) SERTRALINE HCL (SERTRALINE HCL)

## 2010-03-11 NOTE — Assessment & Plan Note (Signed)
Summary: 3 M F/U DLO   Vital Signs:  Patient profile:   70 year old female Height:      64 inches Weight:      228 pounds BMI:     39.28 Temp:     98.4 degrees F oral Pulse rate:   80 / minute Pulse rhythm:   regular BP sitting:   120 / 90  (left arm) Cuff size:   large  Vitals Entered By: Linde Gillis CMA [facility]) (August 15, 2009 3:34 PM) CC: 3 month follow up   History of Present Illness: Left knee did not improve after the last injection all joints are sore--esp the knee  feels she may have thyroid problems very fatigued sleeps a lot losing hair--long standing but worse lately  Stomach and swallowing have been better on the two times a day prilosec  still on hormones got higher dose by mistake afraid to wean due to fatigue  Thinks her sugars are fine hasn't been checking no hypoglycemic reactions had eye exam today  Trouble with right arm went to beach and sunburned healed but still itching underneath topical Rx no help  still uses the gabapentin for the burning in feet   Allergies: 1)  * Lisinopril 2)  Prozac (Fluoxetine Hcl) 3)  Sertraline Hcl (Sertraline Hcl)  Past History:  Past medical, surgical, family and social histories (including risk factors) reviewed for relevance to current acute and chronic problems.  Past Medical History: Reviewed history from 05/18/2008 and no changes required. Asthma Diabetes mellitus, type II Hypertension Fibromyalgia Osteoarthritis Depression Anal Fissure Arthritis Obesity GERD Allergic rhinitis  Past Surgical History: Reviewed history from 12/15/2007 and no changes required. Cataracts- bilat Cholecystectomy 04/06 Hysterectomy 1970's Tubal ligation 1970's Ventral hernia repair 10/2006 ([facility]) DEXA- normal ( except osteopenia at [facility]) 11/04  Family History: Reviewed history from 12/15/2007 and no changes required. Father: Died at age 42, MI Mother: Died at age 2, kidney cancer 20  Brothers-- one deceased with lung cancer, one deceased with liver cancer, one living CAD in  Dad No HTN DM on  Mom's side No breast or colon cancer No FH of Colon Cancer:  Social History: Reviewed history from 10/26/2006 and no changes required. 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   Neck:  supple, no masses, no thyromegaly, no carotid bruits, and no cervical lymphadenopathy.   Lungs:  normal respiratory effort and normal breath sounds.   Heart:  normal rate, regular rhythm, no murmur, and no gallop.   Abdomen:  soft, non-tender, and no masses.   Pulses:  2+ in feet Extremities:  no edema Skin:  non specific coarse rash on upper right arm--very slight Axillary Nodes:  No palpable lymphadenopathy Inguinal Nodes:  No significant adenopathy Psych:  normally interactive, good eye contact, not anxious appearing, and not depressed appearing.    Diabetes Management Exam:    Foot Exam (with socks and/or shoes not present):       Sensory-Pinprick/Light touch:          Left medial foot (L-4): normal          Left dorsal foot (L-5): normal          Left lateral foot (S-1): normal          Right  medial foot (L-4): normal          Right dorsal foot (L-5): normal          Right lateral foot (S-1): normal       Inspection:          Left foot: normal          Right foot: normal       Nails:          Left foot: normal          Right foot: normal    Eye Exam:       Eye Exam done elsewhere          Date: 08/15/2009          Results: no retinopathy          Done by: [facility]   Impression & Recommendations:  Problem # 1:  FATIGUE (ICD-780.79) Assessment New  non specific doubt hypothyroidism but it is possible will check labs try to wean estrogen  Orders: TLB-B12, Serum-Total ONLY  ([address]-U04) TLB-T4 (Thyrox), Free [phone]) TLB-TSH (Thyroid Stimulating Hormone) (84443-TSH) TLB-Hepatic/Liver Function Pnl (80076-HEPATIC) TLB-CBC Platelet - w/Differential (85025-CBCD) TLB-Renal Function Panel (80069-RENAL) Venipuncture (47829)  Problem # 2:  DIABETES MELLITUS, TYPE II WITH NEUROP (ICD-250.00) Assessment: Unchanged  not checking sugars will check labs  Her updated medication list for this problem includes:    Metformin Hcl 1000 Mg Tabs (Metformin hcl) .Marland Kitchen... 1 tablet by mouth twice a day    Aspirin 81 Mg Tabs (Aspirin) .Marland Kitchen... Take one by mouth once a day    Hyzaar 100-25 Mg Tabs (Losartan potassium-hctz) .Marland Kitchen... Take one by mouth once a day    Glipizide Xl 5 Mg Xr24h-tab (Glipizide) .Marland Kitchen... Take 1 tablet every morning befor breakfast  Labs Reviewed: Creat: 0.9 (11/13/2008)     Last Eye Exam: no retinopathy (08/15/2009) Reviewed HgBA1c results: 7.5 (11/13/2008)  7.0 (05/18/2008)  Orders: TLB-A1C / Hgb A1C (Glycohemoglobin) (83036-A1C)  Problem # 3:  GERD (ICD-530.81) Assessment: Improved better with omeprazole two times a day  Her updated medication list for this problem includes:    Omeprazole 20 Mg Cpdr (Omeprazole) .Marland Kitchen... 1 tab by mouth two times a day for acid reflux  Problem # 4:  DEPRESSION (ICD-311) Assessment: Unchanged still lots of stress with ill daughter, etc  Her updated medication list for this problem includes:    Effexor Xr 75 Mg Cp24 (Venlafaxine hcl) .Marland Kitchen... 1 daily or as directed  Problem # 5:  OSTEOARTHRITIS (ICD-715.90) Assessment: Unchanged ongoing Rx and pain ??fibromyalgia again  Her updated medication list for this problem includes:    Celebrex 200 Mg Caps (Celecoxib) .Marland Kitchen... Take one by mouth once a day    Aspirin 81 Mg Tabs (Aspirin) .Marland Kitchen... Take one by mouth once a day  Complete Medication List: 1)  Cardura 8 Mg Tabs (Doxazosin mesylate) .... Take 1/2 tablet by mouth twice a day 2)  Metformin Hcl 1000 Mg Tabs (Metformin hcl)  .Marland Kitchen.. 1 tablet by mouth twice a day 3)  Zyrtec Allergy 10 Mg Tabs (Cetirizine hcl) .... Take one by mouth once a day as needed 4)  Zanaflex 4 Mg Caps (Tizanidine hcl) .... Take one by mouth at bedtime as needed 5)  Celebrex 200 Mg Caps (Celecoxib) .... Take one by mouth once a day 6)  Aspirin 81 Mg Tabs (Aspirin) .... Take one by mouth once a day 7)  Hyzaar 100-25 Mg Tabs (Losartan potassium-hctz) .... Take one by mouth once a day 8)  Effexor Xr 75 Mg Cp24 (Venlafaxine hcl) .Marland Kitchen.. 1 daily or as directed 9)  Glipizide Xl 5 Mg Xr24h-tab (Glipizide) .... Take 1 tablet every morning befor breakfast 10)  Neurontin 100 Mg Caps (Gabapentin) .... Take 1 tablet by mouth two times a day 11)  Calcium 500 Mg Tabs (Calcium carbonate) .... Take 1 by mouth once daily 12)  Potassium 75 Mg Tabs (Potassium) .... Take 1 by mouth once daily 13)  Vitamin D3 1000 Unit Tabs (Cholecalciferol) .... Take 1 by mouth once daily 14)  Vitamin B-12 500 Mcg Tabs (Cyanocobalamin) .... Take 1 by mouth once daily 15)  Omeprazole 20 Mg Cpdr (Omeprazole) .Marland Kitchen.. 1 tab by mouth two times a day for acid reflux 16)  Estradiol 0.5 Mg Tabs (Estradiol) .Marland Kitchen.. 1 tab daily for menopausal symptoms. wean as able 17)  Triamcinolone Acetonide 0.1 % Crea (Triamcinolone acetonide) .... Apply to rash three times a day as needed till clear  Patient Instructions: 1)  Please schedule a follow-up appointment in 2 months.  Prescriptions: TRIAMCINOLONE ACETONIDE 0.1 % CREA (TRIAMCINOLONE ACETONIDE) apply to rash three times a day as needed till clear  #60gm x 1   Entered and Authorized by:   Cindee Salt MD   Signed by:   Cindee Salt MD on 08/15/2009   Method used:   Electronically to        [facility] [address] (retail)       [address]       [place], [place]  [address]       Ph: [phone]       Fax: [phone]   RxID:   [id number] ESTRADIOL 0.5 MG TABS (ESTRADIOL) 1 tab daily for  menopausal symptoms. Wean as able  #90 x 3   Entered and Authorized by:   Cindee Salt MD   Signed by:   Cindee Salt MD on 08/15/2009   Method used:   Electronically to        [facility] [address] (retail)       [address]       [place], [place]  [address]       Ph: [phone]       Fax: [phone]   RxID:   [id number]   Current Allergies (reviewed today): * LISINOPRIL PROZAC (FLUOXETINE HCL) SERTRALINE HCL (SERTRALINE HCL)

## 2010-03-11 NOTE — Assessment & Plan Note (Signed)
Summary: FOLLOW UP / LFW   Vital Signs:  Patient profile:   70 year old female Weight:      228 pounds Temp:     98.2 degrees F oral Pulse rate:   68 / minute Pulse rhythm:   regular BP sitting:   128 / 78  (left arm) Cuff size:   large  Vitals Entered By: Mervin Hack CMA [facility]) (October 17, 2009 3:34 PM) CC: 2 month follow-up   History of Present Illness: Doing better in general Fatigue is better Recent trip to the beach for a week--really enjoyed that  Mood has been generally okay Depression not an issue  Left knee is really bad has had improvement with cortisone injections in past would like one today  Allergies: 1)  * Lisinopril 2)  Prozac (Fluoxetine Hcl) 3)  Sertraline Hcl (Sertraline Hcl)  Past History:  Past medical, surgical, family and social histories (including risk factors) reviewed for relevance to current acute and chronic problems.  Past Medical History: Reviewed history from 05/18/2008 and no changes required. Asthma Diabetes mellitus, type II Hypertension Fibromyalgia Osteoarthritis Depression Anal Fissure Arthritis Obesity GERD Allergic rhinitis  Past Surgical History: Reviewed history from 12/15/2007 and no changes required. Cataracts- bilat Cholecystectomy 04/06 Hysterectomy 1970's Tubal ligation 1970's Ventral hernia repair 10/2006 ([facility]) DEXA- normal ( except osteopenia at [facility]) 11/04  Family History: Reviewed history from 12/15/2007 and no changes required. Father: Died at age 110, MI Mother: Died at age 23, kidney cancer 47 Brothers-- one deceased with lung cancer, one deceased with liver cancer, one living CAD in  Dad No HTN DM on  Mom's side No breast or colon cancer No FH of Colon Cancer:  Social History: Reviewed history from 10/26/2006 and no changes required. 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   Msk:  left knee is thickened but without effusion fair ROM but with some pain Psych:  normally interactive, good eye contact, not anxious appearing, and not depressed appearing.     Impression & Recommendations:  Problem # 1:  FATIGUE (ICD-780.79) Assessment Improved mood is fine no etiology but better so no changes needed  Problem # 2:  LOC OSTEOARTHROS NOT SPEC PRIM/SEC LOWER LEG (ICD-715.36) Assessment: Deteriorated  Procedure  Sterile prep medial    approach  ~1.5cc 2% lido for skin anaesthesia 40mg  kenalog and  ~7cc 2% plain lidocaine injected tolerated well discussed home care  Orders: Joint Aspirate / Injection, Large (30865) Kenalog 10 mg inj (J3301)  Complete Medication List: 1)  Cardura 8 Mg Tabs (Doxazosin mesylate) .... Take 1/2 tablet by mouth twice a day 2)  Metformin Hcl 1000 Mg Tabs (Metformin hcl) .Marland Kitchen.. 1 tablet by mouth twice a day 3)  Zanaflex 4 Mg Caps (Tizanidine hcl) .... Take one by mouth at bedtime as needed 4)  Celebrex 200 Mg Caps (Celecoxib) .... Take one by mouth once a day 5)  Hyzaar 100-25 Mg Tabs (Losartan potassium-hctz) .... Take one by mouth once a day 6)  Effexor Xr 75 Mg Cp24 (Venlafaxine hcl) .Marland Kitchen.. 1 daily or as directed 7)  Glipizide Xl 5 Mg Xr24h-tab (Glipizide) .... Take 1 tablet every morning befor breakfast 8)  Neurontin 100 Mg Caps (Gabapentin) .... Take 1 tablet by mouth two times a day 9)  Omeprazole 20 Mg Cpdr (Omeprazole) .Marland Kitchen.. 1 tab by mouth two times a day for acid reflux 10)  Estradiol 0.5 Mg Tabs (Estradiol) .Marland Kitchen.. 1 tab daily for menopausal symptoms. wean as able 11)  Triamcinolone Acetonide 0.1 % Crea (Triamcinolone acetonide) .... Apply to rash three times a day as needed till clear 12)  Zyrtec Allergy 10 Mg Tabs (Cetirizine hcl) .... Take one by mouth once a day as needed 13)  Aspirin 81 Mg Tabs (Aspirin) ....  Take one by mouth once a day 14)  Calcium 500 Mg Tabs (Calcium carbonate) .... Take 1 by mouth once daily 15)  Potassium 75 Mg Tabs (Potassium) .... Take 1 by mouth once daily 16)  Vitamin D3 1000 Unit Tabs (Cholecalciferol) .... Take 1 by mouth once daily 17)  Vitamin B-12 500 Mcg Tabs (Cyanocobalamin) .... Take 1 by mouth once daily  Other Orders: Flu Vaccine 40yrs + ([address]) Admin 1st Vaccine ([address]) Admin 1st Vaccine [facility]) [phone])  Patient Instructions: 1)  Please schedule a follow-up appointment in 4 months .   Current Allergies (reviewed today): * LISINOPRIL PROZAC (FLUOXETINE HCL) SERTRALINE HCL (SERTRALINE HCL)   Influenza Vaccine    Vaccine Type: Fluvax 3+    Site: left deltoid    Mfr: GlaxoSmithKline    Dose: 0.5 ml    Route: IM    Given by: Mervin Hack CMA (AAMA)    Exp. Date: 08/09/2010    Lot #: [id number]    VIS given: 09/03/09 version given October 17, 2009.  Flu Vaccine Consent Questions    Do you have a history of severe allergic reactions to this vaccine? no    Any prior history of allergic reactions to egg and/or gelatin? no    Do you have a sensitivity to the preservative Thimersol? no    Do you have a past history of Guillan-Barre Syndrome? no    Do you currently have an acute febrile illness? no    Have you ever had a severe reaction to latex? no    Vaccine information given and explained to patient? yes    Are you currently pregnant? no

## 2010-03-13 NOTE — Assessment & Plan Note (Signed)
Summary: COUGH,HA,BACKACHE/CLE   Vital Signs:  Patient profile:   70 year old female Weight:      228.50 pounds Temp:     97.8 degrees F oral Pulse rate:   88 / minute Pulse rhythm:   regular BP sitting:   138 / 80  (left arm) Cuff size:   large  Vitals Entered By: Selena Batten Dance CMA (AAMA) (February 13, 2010 9:15 AM) CC: COugh/Headache/backache   History of Present Illness: CC: cough/HA  2wk h/o cough productive of green sputum and some blood in sputum, HA (bitemporal).  Also had cold 4 wks ago that got better with time.  + chills and abd pain from coughing.  + rash on scalp itchy and burning.  + congestion.  Eyes "hurting and weak" no vision changes.  + achey.  Has tried expectorant as well as corsidin and cough syrup.  none have helped.  worse at night.  keep ing her up.  + PNDrip.  No fevers, n/v/d, rashes.  + sick contacts  No smoking, no h/o asthma (but in chart asthma).  Current Medications (verified): 1)  Cardura 8 Mg Tabs (Doxazosin Mesylate) .... Take 1/2 Tablet By Mouth Twice A Day 2)  Metformin Hcl 1000 Mg Tabs (Metformin Hcl) .Marland Kitchen.. 1 Tablet By Mouth Twice A Day 3)  Zanaflex 4 Mg  Caps (Tizanidine Hcl) .... Take One By Mouth At Bedtime As Needed 4)  Celebrex 200 Mg  Caps (Celecoxib) .... Take One By Mouth Once A Day 5)  Hyzaar 100-25 Mg  Tabs (Losartan Potassium-Hctz) .... Take One By Mouth Once A Day 6)  Effexor Xr 75 Mg  Cp24 (Venlafaxine Hcl) .Marland Kitchen.. 1 Daily or As Directed 7)  Glipizide Xl 5 Mg Xr24h-Tab (Glipizide) .... Take 1 Tablet Every Morning Befor Breakfast 8)  Neurontin 100 Mg Caps (Gabapentin) .... Take 1 Tablet By Mouth Two Times A Day 9)  Omeprazole 20 Mg Cpdr (Omeprazole) .Marland Kitchen.. 1 Tab By Mouth Two Times A Day For Acid Reflux 10)  Estradiol 0.5 Mg Tabs (Estradiol) .Marland Kitchen.. 1 Tab Daily For Menopausal Symptoms. Wean As Able 11)  Zyrtec Allergy 10 Mg  Tabs (Cetirizine Hcl) .... Take One By Mouth Once A Day As Needed 12)  Aspirin 81 Mg  Tabs (Aspirin) .... Take One By  Mouth Once A Day 13)  Calcium 500 Mg Tabs (Calcium Carbonate) .... Take 1 By Mouth Once Daily 14)  Potassium 75 Mg Tabs (Potassium) .... Take 1 By Mouth Once Daily 15)  Vitamin D3 1000 Unit Tabs (Cholecalciferol) .... Take 1 By Mouth Once Daily 16)  Vitamin B-12 500 Mcg Tabs (Cyanocobalamin) .... 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   Head:  Normocephalic and atraumatic without obvious abnormalities. No apparent alopecia or balding. Eyes:  No corneal or conjunctival inflammation noted. EOMI. Perrla. Ears:  TMs clear bilaterally, canals clear, no rash Nose:  nares clear, + irritated bilaterally Mouth:  MMM, slight oropharyngeal erythema, no exudate Neck:  supple, no masses, no thyromegaly, no carotid bruits, and no cervical  lymphadenopathy.   Lungs:  normal respiratory effort and normal breath sounds.   Heart:  normal rate, regular rhythm, no murmur, and no gallop.   Pulses:  2+ rad pulses, brisk cap refill Extremities:  no pedal edema Skin:  L posterior scalp along C2 distribution with erythematous drying ulcerative rash, very pruritic and some burning.   Impression & Recommendations:  Problem # 1:  ACUTE BRONCHITIS (ICD-466.0) treat with zpack as going on for 2+ wks, comorbidities (h/o asthma in chart, pt denies).  red flags to return discussed.  Her updated medication list for this problem includes:    Zithromax Z-pak 250 Mg Tabs (Azithromycin) .Marland Kitchen... Take as  directed    Cheratussin Ac 100-10 Mg/31ml Syrp (Guaifenesin-codeine) ..... One teaspoon at bedtime as needed cough  Problem # 2:  SKIN RASH (ICD-782.1) suspicious for shingles.  has been going on for 2+ wks.  however, given location, treat with antiviral.  The following medications were removed from the medication list:    Triamcinolone Acetonide 0.1 % Crea (Triamcinolone acetonide) .Marland Kitchen... Apply to rash three times a day as needed till clear  Complete Medication List: 1)  Cardura 8 Mg Tabs (Doxazosin mesylate) .... Take 1/2 tablet by mouth twice a day 2)  Metformin Hcl 1000 Mg Tabs (Metformin hcl) .Marland Kitchen.. 1 tablet by mouth twice a day 3)  Zanaflex 4 Mg Caps (Tizanidine hcl) .... Take one by mouth at bedtime as needed 4)  Celebrex 200 Mg Caps (Celecoxib) .... Take one by mouth once a day 5)  Hyzaar 100-25 Mg Tabs (Losartan potassium-hctz) .... Take one by mouth once a day 6)  Effexor Xr 75 Mg Cp24 (Venlafaxine hcl) .Marland Kitchen.. 1 daily or as directed 7)  Glipizide Xl 5 Mg Xr24h-tab (Glipizide) .... Take 1 tablet every morning befor breakfast 8)  Neurontin 100 Mg Caps (Gabapentin) .... Take 1 tablet by mouth two times a day 9)  Omeprazole 20 Mg Cpdr (Omeprazole) .Marland Kitchen.. 1 tab by mouth two times a day for acid reflux 10)  Estradiol 0.5 Mg Tabs (Estradiol) .Marland Kitchen.. 1 tab daily for menopausal symptoms. wean as able 11)  Zyrtec Allergy 10 Mg Tabs (Cetirizine hcl) .... Take one by mouth once a day as needed 12)  Aspirin 81 Mg Tabs (Aspirin) .... Take one by mouth once a day 13)  Calcium 500 Mg Tabs (Calcium carbonate) .... Take 1 by mouth once daily 14)  Potassium 75 Mg Tabs (Potassium) .... Take 1 by mouth once daily 15)  Vitamin D3 1000 Unit Tabs (Cholecalciferol) .... Take 1 by mouth once daily 16)  Vitamin B-12 500 Mcg Tabs (Cyanocobalamin) .... Take 1 by mouth once daily 17)  Zithromax Z-pak 250 Mg Tabs (Azithromycin) .... Take as directed 18)  Cheratussin Ac 100-10 Mg/85ml Syrp (Guaifenesin-codeine) .... One  teaspoon at bedtime as needed cough 19)  Valacyclovir Hcl 1 Gm Tabs (Valacyclovir hcl) .... One by mouth three times x 7 days  Patient Instructions: 1)  Take zpack as directed. 2)  Cheratussin at night for cough. 3)  Push fluids and plenty of rest.  Continue celebrex and tylenol. 4)  Valtrex for shingles. 5)  Please return if you are not improving as expected, or if you have high fevers (>101.5) or difficulty swallowing/breathing. 6)  Call clinic with questions.  Pleasure to see you today!  Prescriptions: VALACYCLOVIR HCL 1 GM TABS (VALACYCLOVIR HCL) one by mouth three times x 7 days  #21 x 0   Entered and Authorized by:   Eustaquio Boyden  MD   Signed by:   Eustaquio Boyden  MD on 02/13/2010   Method used:   Electronically to        [facility] [address] (retail)       [address]       [place], [place]  [address]       Ph: [phone]       Fax: [phone]   RxID:   [id number] CHERATUSSIN AC 100-10 MG/5ML SYRP (GUAIFENESIN-CODEINE) one teaspoon at bedtime as needed cough  #100cc x 0   Entered and Authorized by:   Eustaquio Boyden  MD   Signed by:   Eustaquio Boyden  MD on 02/13/2010   Method used:   Print then Give to Patient   RxID:   [id number] ZITHROMAX Z-PAK 250 MG TABS (AZITHROMYCIN) take as directed  #1 x 0   Entered and Authorized by:   Eustaquio Boyden  MD   Signed by:   Eustaquio Boyden  MD on 02/13/2010   Method used:   Electronically to        [facility] [address] (retail)       [address]       [place], [place]  [address]       Ph: [phone]       Fax: [phone]   RxID:   [id number]    Orders Added: 1)  Est. Patient Level III [[address]]    Current Allergies (reviewed today): * LISINOPRIL PROZAC (FLUOXETINE HCL) SERTRALINE HCL (SERTRALINE HCL)

## 2010-03-13 NOTE — Assessment & Plan Note (Signed)
Summary: 4 m f/u dlo   Vital Signs:  Patient profile:   70 year old female Weight:      226 pounds Temp:     98.3 degrees F oral Pulse rate:   96 / minute Pulse rhythm:   regular BP sitting:   130 / 80  (left arm) Cuff size:   large  Vitals Entered By: Mervin Hack CMA [facility]) (February 21, 2010 10:32 AM) CC: 4 month follow-up   History of Present Illness: Feels better Bronchitis seems to finally be cleared may have gotten sick from grandson  Some rash in head --got the antiviral for possible shingles Some itching still but not painful Discussed that she should wait a year before taking zostavax  Still with some back pain Variable location No known injury takes tylenol--not much help Has improved on its own---worst when she was coughing  Not checking sugars lately No hypoglycemic reactions No sores or pain in feet  No chest pain No SOB No edema  Miood generally okay No sig depression  stomach controlled on meds  Allergies: 1)  * Lisinopril 2)  Prozac (Fluoxetine Hcl) 3)  Sertraline Hcl (Sertraline Hcl)  Past History:  Past medical, surgical, family and social histories (including risk factors) reviewed for relevance to current acute and chronic problems.  Past Medical History: Reviewed history from 05/18/2008 and no changes required. Asthma Diabetes mellitus, type II Hypertension Fibromyalgia Osteoarthritis Depression Anal Fissure Arthritis Obesity GERD Allergic rhinitis  Past Surgical History: Reviewed history from 12/15/2007 and no changes required. Cataracts- bilat Cholecystectomy 04/06 Hysterectomy 1970's Tubal ligation 1970's Ventral hernia repair 10/2006 ([facility]) DEXA- normal ( except osteopenia at [facility]) 11/04  Family History: Reviewed history from 12/15/2007 and no changes required. Father: Died at age 67, MI Mother: Died at age 53, kidney cancer 83 Brothers-- one deceased with lung cancer, one deceased with liver  cancer, one living CAD in  Dad No HTN DM on  Mom's side No breast or colon cancer No FH of Colon Cancer:  Social History: Reviewed history from 10/26/2006 and no changes required. Marital Status: Divorced Children: 3 Occupation: Homemaker Never Smoked Alcohol use-no  Review of Systems       Not much exercise--feels limited due to knee Discussed water exercise[facility] Sleep better since coughing improved  Physical Exam  General:  alert and normal appearance.   Neck:  supple, no masses, no thyromegaly, no carotid bruits, and no cervical lymphadenopathy.   Lungs:  normal respiratory effort, no intercostal retractions, no accessory muscle use, and normal breath sounds.   Heart:  normal rate, regular rhythm, no murmur, and no gallop.   Pulses:  1+ in feet Extremities:  no edema Skin:  non specific redness left posterior neck Psych:  normally interactive, good eye contact, not anxious appearing, and not depressed appearing.    Diabetes Management Exam:    Foot Exam (with socks and/or shoes not present):       Sensory-Pinprick/Light touch:          Left medial foot (L-4): normal          Left dorsal foot (L-5): normal          Left lateral foot (S-1): normal          Right medial foot (L-4): normal          Right dorsal foot (L-5): normal          Right lateral foot (S-1): normal  Inspection:          Left foot: normal          Right foot: normal       Nails:          Left foot: normal          Right foot: normal   Impression & Recommendations:  Problem # 1:  DIABETES MELLITUS, TYPE II WITH NEUROP (ICD-250.00) Assessment Unchanged  not checking  will do labs  Her updated medication list for this problem includes:    Metformin Hcl 1000 Mg Tabs (Metformin hcl) .Marland Kitchen... 1 tablet by mouth twice a day    Hyzaar 100-25 Mg Tabs (Losartan potassium-hctz) .Marland Kitchen... Take one by mouth once a day    Glipizide Xl 5 Mg Xr24h-tab (Glipizide) .Marland Kitchen... Take 1 tablet  every morning befor breakfast    Aspirin 81 Mg Tabs (Aspirin) .Marland Kitchen... Take one by mouth once a day  Labs Reviewed: Creat: 1.2 (08/15/2009)     Last Eye Exam: no retinopathy (08/15/2009) Reviewed HgBA1c results: 7.3 (08/15/2009)  7.5 (11/13/2008)  Orders: TLB-A1C / Hgb A1C (Glycohemoglobin) (83036-A1C)  Problem # 2:  HYPERTENSION (ICD-401.9) Assessment: Unchanged  good control no changes needed  Her updated medication list for this problem includes:    Cardura 8 Mg Tabs (Doxazosin mesylate) .Marland Kitchen... Take 1/2 tablet by mouth twice a day    Hyzaar 100-25 Mg Tabs (Losartan potassium-hctz) .Marland Kitchen... Take one by mouth once a day  BP today: 130/80 Prior BP: 138/80 (02/13/2010)  Labs Reviewed: K+: 4.3 (08/15/2009) Creat: : 1.2 (08/15/2009)   Chol: 142 (11/13/2008)   HDL: 38.90 (11/13/2008)   LDL: 74 (11/13/2008)   TG: 144.0 (11/13/2008)  Orders: Venipuncture (71062) TLB-Renal Function Panel (80069-RENAL) TLB-CBC Platelet - w/Differential (85025-CBCD) TLB-Hepatic/Liver Function Pnl (80076-HEPATIC) TLB-TSH (Thyroid Stimulating Hormone) (84443-TSH)  Problem # 3:  DEPRESSION (ICD-311) Assessment: Unchanged mood is good will continue med  Her updated medication list for this problem includes:    Effexor Xr 75 Mg Cp24 (Venlafaxine hcl) .Marland Kitchen... 1 daily or as directed  Problem # 4:  OSTEOARTHRITIS (ICD-715.90) Assessment: Deteriorated ongoing problems [facility] muscle strenthening --esp quads  Her updated medication list for this problem includes:    Celebrex 200 Mg Caps (Celecoxib) .Marland Kitchen... Take one by mouth once a day    Aspirin 81 Mg Tabs (Aspirin) .Marland Kitchen... Take one by mouth once a day  Problem # 5:  GERD (ICD-530.81) Assessment: Unchanged okay on the med  Her updated medication list for this problem includes:    Omeprazole 20 Mg Cpdr (Omeprazole) .Marland Kitchen... 1 tab by mouth two times a day for acid reflux  Complete Medication List: 1)  Cardura 8 Mg Tabs (Doxazosin  mesylate) .... Take 1/2 tablet by mouth twice a day 2)  Metformin Hcl 1000 Mg Tabs (Metformin hcl) .Marland Kitchen.. 1 tablet by mouth twice a day 3)  Zanaflex 4 Mg Caps (Tizanidine hcl) .... Take one by mouth at bedtime as needed 4)  Celebrex 200 Mg Caps (Celecoxib) .... Take one by mouth once a day 5)  Hyzaar 100-25 Mg Tabs (Losartan potassium-hctz) .... Take one by mouth once a day 6)  Effexor Xr 75 Mg Cp24 (Venlafaxine hcl) .Marland Kitchen.. 1 daily or as directed 7)  Glipizide Xl 5 Mg Xr24h-tab (Glipizide) .... Take 1 tablet every morning befor breakfast 8)  Neurontin 100 Mg Caps (Gabapentin) .... Take 1 tablet by mouth two times a day 9)  Omeprazole 20 Mg Cpdr (Omeprazole) .Marland KitchenMarland KitchenMarland Kitchen  1 tab by mouth two times a day for acid reflux 10)  Estradiol 0.5 Mg Tabs (Estradiol) .Marland Kitchen.. 1 tab daily for menopausal symptoms. wean as able 11)  Zyrtec Allergy 10 Mg Tabs (Cetirizine hcl) .... Take one by mouth once a day as needed 12)  Aspirin 81 Mg Tabs (Aspirin) .... Take one by mouth once a day 13)  Calcium 500 Mg Tabs (Calcium carbonate) .... Take 1 by mouth once daily 14)  Potassium 75 Mg Tabs (Potassium) .... Take 1 by mouth once daily 15)  Vitamin D3 1000 Unit Tabs (Cholecalciferol) .... Take 1 by mouth once daily 16)  Vitamin B-12 500 Mcg Tabs (Cyanocobalamin) .... Take 1 by mouth once daily  Patient Instructions: 1)  Please schedule a follow-up appointment in 6 months .    Orders Added: 1)  Est. Patient Level IV [57846] 2)  TLB-A1C / Hgb A1C (Glycohemoglobin) [83036-A1C] 3)  Venipuncture [36415] 4)  TLB-Renal Function Panel [80069-RENAL] 5)  TLB-CBC Platelet - w/Differential [85025-CBCD] 6)  TLB-Hepatic/Liver Function Pnl [80076-HEPATIC] 7)  TLB-TSH (Thyroid Stimulating Hormone) [96295-MWU]    Current Allergies (reviewed today): * LISINOPRIL PROZAC (FLUOXETINE HCL) SERTRALINE HCL (SERTRALINE HCL)

## 2010-03-13 NOTE — Progress Notes (Signed)
Summary: Want referral  Phone Note Call from Patient Call back at Home Phone [phone]   Caller: Patient Call For: Cindee Salt MD Summary of Call: pt calling asking for referral to GYN, pt states she has cyst and collasped uterus?and need to have it fixed, per pt she haven't had a vaginal exam in 5 years or more. Initial call taken by: Mervin Hack CMA [facility] Dull),  February 25, 2010 9:29 AM  Follow-up for Phone Call        okay to set up Follow-up by: Cindee Salt MD,  February 25, 2010 10:20 AM  New Problems: UTERINE PROLAPSE (ICD-618.1)   New Problems: UTERINE PROLAPSE (ICD-618.1)

## 2010-06-24 NOTE — Op Note (Signed)
[name], [name]               ACCOUNT NO.:  [id number]   MEDICAL RECORD NO.:  [id number]          PATIENT TYPE:  AMB   LOCATION:  DAY                          FACILITY:  [facility]   PHYSICIAN:  Thomas A. Cornett, M.D.DATE OF BIRTH:  [birth year]   DATE OF PROCEDURE:  10/27/2006  DATE OF DISCHARGE:                               OPERATIVE REPORT   PREOPERATIVE DIAGNOSES:  Ventral hernia.   POSTOPERATIVE DIAGNOSES:  3 x 4 cm ventral hernia.   PROCEDURE:  Laparoscopic repair of ventral hernia with Proceed mesh.   SURGEON:  Harriette Bouillon, MD.   ASSISTANT:  Claud Kelp, MD.   ANESTHESIA:  General endotracheal anesthesia with 30 mL of 0.25%  Sensorcaine local.   ESTIMATED BLOOD LOSS:  50 mL.   DRAINS:  None.   INDICATIONS FOR PROCEDURE:  The patient is a 70 year old female whose  developed a ventral hernia above her umbilicus.  She presents today for  a laparoscopic approach. This felt rather large on examination and I  felt this would be a better way to fixing this as opposed to an open  approach.  I discussed with her open versus laparoscopic and she agreed  to proceed with laparoscopic with hopefully a quicker recovery time.   DESCRIPTION OF PROCEDURE:  The patient was brought to the operating room  and placed supine.  After induction of general anesthesia, a Foley  catheter was placed.  The abdomen was then prepped and draped in a  sterile fashion and an Ioban and drape was used.  I used a 5-mm OptiVu  and made a small incision in the left lower quadrant.  We were able to  advance the OptiVu under direct vision into the abdominal cavity without  difficulty.  There was no evidence of bowel injury or organ injury with  insertion of this trocar.  We insufflated to 15 mmHg of CO2.  Laparoscopy performed.  There were no intra-abdominal adhesions.  Just  above the umbilicus was roughly a 3 x 4 cm fascial defect.  I then  placed a 5-mm port in the left lower quadrant under direct  vision and a  5-mm port in the right lower quadrant and a 12 mm port to place the mesh  to the right upper quadrant.  We did measured the defect.  A spinal  needle was used to mark off marks around the defect on the skin under  direct vision.  We used a 10 x 15 piece of Proceed mesh, oriented it and  placed fixation sutures in the blue side of the mesh that would be up  against the abdominal wall.  We  rolled the mesh up, placed it in the  abdominal cavity and rolled it and laid it flat.  We then used a suture  passer to grab all sutures in all four quadrants of the mesh, pull up  the mesh in the abdominal wall and tied these down.  There was some  bleeding from the puncture wound on the right side of the mesh.  I  placed a #0 Vicryl stitch around this after the  mesh was pulled up using  the Storz suture passer.  This seemed to stop the bleeding from the  rectus muscle. A protacker was used, we tacked the mesh  circumferentially so it lay flush with very little pleating. There was  some blood in the abdominal cavity. I went ahead and irrigated and  suctioned this out.  I reinspected the abdominal wall so no evidence of  any further bleeding or expanding hematoma at this point. Organs were  examined and I saw no evidence of bowel injury or colonic injury. Excess  fluid and irrigation were suctioned out.  I reinspected the rectus  abdominis muscle on the right and again saw no expanding hematoma or any  further bleeding from the mesh.  At this point, we released our CO2 and  removed our ports under direct vision.  There was no evidence of any  bleeding in the port sites.  Monocryl was used as a subcutaneous stitch  and then I used Dermabond to close the skin incisions.  All final counts  of sponge, needle and instruments were found to be correct at this  portion of the case.  The patient was awoke and taken to recovery in  satisfactory condition.      Thomas A. Cornett, M.D.   Electronically Signed     TAC/MEDQ  D:  10/27/2006  T:  10/27/2006  Job:  [id number]   cc:   Karie Schwalbe, MD  [address] [place], [place] [address]

## 2010-08-21 ENCOUNTER — Encounter: Payer: Self-pay | Admitting: Internal Medicine

## 2010-08-22 ENCOUNTER — Encounter: Payer: Self-pay | Admitting: Internal Medicine

## 2010-08-25 ENCOUNTER — Ambulatory Visit (INDEPENDENT_AMBULATORY_CARE_PROVIDER_SITE_OTHER): Payer: PRIVATE HEALTH INSURANCE | Admitting: Internal Medicine

## 2010-08-25 ENCOUNTER — Encounter: Payer: Self-pay | Admitting: Internal Medicine

## 2010-08-25 VITALS — BP 110/70 | HR 102 | Temp 98.1°F | Ht 64.0 in | Wt 223.0 lb

## 2010-08-25 DIAGNOSIS — I10 Essential (primary) hypertension: Secondary | ICD-10-CM

## 2010-08-25 DIAGNOSIS — D649 Anemia, unspecified: Secondary | ICD-10-CM

## 2010-08-25 DIAGNOSIS — M1710 Unilateral primary osteoarthritis, unspecified knee: Secondary | ICD-10-CM

## 2010-08-25 DIAGNOSIS — J45909 Unspecified asthma, uncomplicated: Secondary | ICD-10-CM

## 2010-08-25 DIAGNOSIS — E119 Type 2 diabetes mellitus without complications: Secondary | ICD-10-CM

## 2010-08-25 DIAGNOSIS — IMO0001 Reserved for inherently not codable concepts without codable children: Secondary | ICD-10-CM

## 2010-08-25 DIAGNOSIS — D6489 Other specified anemias: Secondary | ICD-10-CM | POA: Insufficient documentation

## 2010-08-25 LAB — CBC WITH DIFFERENTIAL/PLATELET
Basophils Absolute: 0 10*3/uL (ref 0.0–0.1)
Hemoglobin: 10.1 g/dL — ABNORMAL LOW (ref 12.0–15.0)
Lymphocytes Relative: 20.2 % (ref 12.0–46.0)
Monocytes Relative: 6.4 % (ref 3.0–12.0)
Neutrophils Relative %: 71.4 % (ref 43.0–77.0)
Platelets: 224 10*3/uL (ref 150.0–400.0)
RDW: 15.7 % — ABNORMAL HIGH (ref 11.5–14.6)

## 2010-08-25 LAB — HEMOGLOBIN A1C: Hgb A1c MFr Bld: 8.4 % — ABNORMAL HIGH (ref 4.6–6.5)

## 2010-08-25 MED ORDER — TRIAMCINOLONE ACETONIDE 40 MG/ML IJ SUSP: 40.0000 mg | Freq: Once | INTRAMUSCULAR | Status: DC

## 2010-08-25 NOTE — Assessment & Plan Note (Signed)
Breathing has been fine

## 2010-08-25 NOTE — Assessment & Plan Note (Signed)
BP Readings from Last 3 Encounters:  08/25/10 110/70  02/21/10 130/80  02/13/10 138/80   Good control No changes needed Lab Results  Component Value Date   CREATININE 1.0 02/21/2010

## 2010-08-25 NOTE — Assessment & Plan Note (Signed)
Procedure  Sterile prep to medial left knee Local anaesthesia with 2cc 2% plain lidocaine Kenalog 40mg  and 7cc 2% lido instilled without difficulty No problems Discussed home care

## 2010-08-25 NOTE — Assessment & Plan Note (Signed)
Has been more careful with eating and lost 5# Will recheck A1c

## 2010-08-25 NOTE — Assessment & Plan Note (Signed)
Slowly declining hgb Not iron deficiency  If continues to drop will send to heme---?myelodysplasia

## 2010-08-25 NOTE — Assessment & Plan Note (Signed)
Doing okay  Will continue the venlafaxine as that seemed to help with the chronic aching

## 2010-08-25 NOTE — Progress Notes (Signed)
Subjective:    Patient ID: [name], female    DOB: Oct 05, 1940, 70 y.o.   MRN: [id number]  HPI Did see gyn, then saw specialist at [facility] not an option Still considering whether to have surgery Doesn't seem to be bothering her as much now  Having more left knee pain Considering TKR--would go to [facility] Uses topical Rx Hasn't tried tylenol Has some left over pain pills---rarely uses them  Not checking sugars Can "tell" her sugars are okay Will have occ mild hypoglycemic reactions if she misses meal---dizzy and nausea. Better with glucose tabs  No chest pain No SOB Has some right arm pain---low level constant aching. Considers this arthritis  Mood is okay Comfortable still on the meds Took the effexor for fibromyalgia Only takes 1/2 per day Aching worsened trying to come off this though  Current Outpatient Prescriptions on File Prior to Visit  Medication Sig Dispense Refill  . aspirin 81 MG tablet Take 81 mg by mouth daily.        . Calcium Carbonate (CALCIUM 500 PO) Take 1 tablet by mouth daily.        . celecoxib (CELEBREX) 200 MG capsule Take 200 mg by mouth daily.        . cetirizine (ZYRTEC) 10 MG tablet Take 10 mg by mouth daily as needed.        . cholecalciferol (VITAMIN D) 1000 UNITS tablet Take 1,000 Units by mouth daily.        Marland Kitchen doxazosin (CARDURA) 8 MG tablet 1/2 tab twice a day       . estradiol (ESTRACE) 0.5 MG tablet Take 0.5 mg by mouth daily. For menopausal symptoms, wean when able       . gabapentin (NEURONTIN) 100 MG capsule Take 100 mg by mouth 2 (two) times daily.        Marland Kitchen glipiZIDE (GLUCOTROL XL) 5 MG 24 hr tablet Take 5 mg by mouth. Every morning before breakfast       . losartan-hydrochlorothiazide (HYZAAR) 100-25 MG per tablet Take 1 tablet by mouth daily.        . metFORMIN (GLUCOPHAGE) 1000 MG tablet Take 1,000 mg by mouth 2 (two) times daily with a meal.        . omeprazole (PRILOSEC) 20 MG capsule Take 20 mg by mouth 2 (two) times  daily. For acid reflux        . Potassium 75 MG TABS Take 1 tablet by mouth daily.        Marland Kitchen tiZANidine (ZANAFLEX) 4 MG capsule Take 4 mg by mouth at bedtime as needed.        . venlafaxine (EFFEXOR-XR) 75 MG 24 hr capsule Take 75 mg by mouth daily. Or as directed       . vitamin B-12 (CYANOCOBALAMIN) 500 MCG tablet Take 500 mcg by mouth daily.          Allergies  Allergen Reactions  . Fluoxetine Hcl     REACTION: raised blood pressure  . Lisinopril     REACTION: dizzy  . Sertraline Hcl     REACTION: dizziness    Past Medical History  Diagnosis Date  . Asthma   . Diabetes mellitus     type 2  . Hypertension   . Fibromyalgia   . Osteoarthritis   . Depression   . Anal fissure   . Arthritis   . Obesity   . GERD (gastroesophageal reflux disease)   . Allergic rhinitis  Past Surgical History  Procedure Date  . Cataract extraction, bilateral   . Cholecystectomy 4/06  . Abdominal hysterectomy 1970's  . Tubal ligation 1970's  . Ventral hernia repair 9/08    [facility]    Family History  Problem Relation Age of Onset  . Heart attack Father   . Kidney cancer Mother   . Lung cancer Brother   . Liver cancer Brother   . Coronary artery disease Father   . Hypertension Neg Hx   . Diabetes Other     Maternal side  . Breast cancer Neg Hx   . Colon cancer Neg Hx     History   Social History  . Marital Status: Divorced    Spouse Name: N/A    Number of Children: 3  . Years of Education: N/A   Occupational History  . Homemaker    Social History Main Topics  . Smoking status: Never Smoker   . Smokeless tobacco: Not on file  . Alcohol Use: No  . Drug Use: Not on file  . Sexually Active: Not on file   Other Topics Concern  . Not on file   Social History Narrative  . No narrative on file   Review of Systems Chronic sleep problems Trying to be more careful with eating Weight is down 5#    Objective:   Physical Exam  Constitutional: She appears  well-developed and well-nourished. No distress.  Neck: Normal range of motion. Neck supple. No thyromegaly present.  Cardiovascular: Normal rate, regular rhythm, normal heart sounds and intact distal pulses.  Exam reveals no gallop.   No murmur heard. Pulmonary/Chest: Effort normal and breath sounds normal. No respiratory distress. She has no wheezes. She has no rales.  Musculoskeletal:       Thickening in knees without obvious effusion  Lymphadenopathy:    She has no cervical adenopathy.  Skin:       No foot ulcers or lesions  Psychiatric: She has a normal mood and affect. Her behavior is normal. Judgment and thought content normal.          Assessment & Plan:

## 2010-09-02 ENCOUNTER — Other Ambulatory Visit: Payer: Self-pay | Admitting: Internal Medicine

## 2010-09-30 ENCOUNTER — Other Ambulatory Visit: Payer: Self-pay | Admitting: Internal Medicine

## 2010-09-30 NOTE — Telephone Encounter (Signed)
Rx sent electronically.  

## 2010-10-23 ENCOUNTER — Other Ambulatory Visit: Payer: Self-pay | Admitting: *Deleted

## 2010-10-23 MED ORDER — GABAPENTIN 100 MG PO CAPS: ORAL_CAPSULE | ORAL | Status: DC

## 2010-10-23 MED ORDER — LOSARTAN POTASSIUM-HCTZ 100-25 MG PO TABS: ORAL_TABLET | ORAL | Status: DC

## 2010-10-23 MED ORDER — TIZANIDINE HCL 4 MG PO TABS: 4.0000 mg | ORAL_TABLET | Freq: Every evening | ORAL | Status: DC | PRN

## 2010-10-23 MED ORDER — CELECOXIB 200 MG PO CAPS: 200.0000 mg | ORAL_CAPSULE | Freq: Every day | ORAL | Status: DC

## 2010-10-23 MED ORDER — VENLAFAXINE HCL ER 75 MG PO CP24: ORAL_CAPSULE | ORAL | Status: DC

## 2010-10-23 NOTE — Telephone Encounter (Signed)
Pt is asking for written scripts for mail order.  She would like 90 day supplies on all.  Please call when ready and she will pick up.

## 2010-10-23 NOTE — Telephone Encounter (Signed)
Spoke with patient and advised results   

## 2010-10-23 NOTE — Telephone Encounter (Signed)
Rx's written. 

## 2010-10-27 ENCOUNTER — Other Ambulatory Visit: Payer: Self-pay | Admitting: Internal Medicine

## 2010-11-04 ENCOUNTER — Telehealth: Payer: Self-pay | Admitting: *Deleted

## 2010-11-04 MED ORDER — TIZANIDINE HCL 4 MG PO TABS: 4.0000 mg | ORAL_TABLET | Freq: Every evening | ORAL | Status: DC | PRN

## 2010-11-04 NOTE — Telephone Encounter (Signed)
Okay to increase quantity to #120  She should not change how she takes it

## 2010-11-04 NOTE — Telephone Encounter (Signed)
quantity changed and rx faxed back

## 2010-11-04 NOTE — Telephone Encounter (Signed)
Received fax asking to increase quantity on Zanaflex from 90 to 120 because it's dispensed in sealed bottles. Form on your desk Please advise.

## 2010-11-11 LAB — GLUCOSE, CAPILLARY
Glucose-Capillary: 105 — ABNORMAL HIGH
Glucose-Capillary: 123 — ABNORMAL HIGH

## 2010-11-17 ENCOUNTER — Other Ambulatory Visit: Payer: Self-pay | Admitting: Internal Medicine

## 2010-11-20 LAB — DIFFERENTIAL
Eosinophils Absolute: 0.1
Lymphs Abs: 1.4
Monocytes Relative: 7
Neutrophils Relative %: 69

## 2010-11-20 LAB — CBC
HCT: 34.4 — ABNORMAL LOW
MCV: 89.7
RBC: 3.83 — ABNORMAL LOW
WBC: 6.4

## 2010-11-20 LAB — URINALYSIS, ROUTINE W REFLEX MICROSCOPIC
Glucose, UA: 250 — AB
Hgb urine dipstick: NEGATIVE
Leukocytes, UA: NEGATIVE
Protein, ur: 30 — AB
Urobilinogen, UA: 1

## 2010-11-20 LAB — BASIC METABOLIC PANEL
Chloride: 100
Creatinine, Ser: 0.77
GFR calc Af Amer: >60
Potassium: 3.7

## 2010-11-20 LAB — URINE MICROSCOPIC-ADD ON

## 2011-02-20 ENCOUNTER — Other Ambulatory Visit: Payer: Self-pay | Admitting: Internal Medicine

## 2011-02-20 MED ORDER — METFORMIN HCL 1000 MG PO TABS: 1000.0000 mg | ORAL_TABLET | Freq: Two times a day (BID) | ORAL | Status: DC

## 2011-02-20 NOTE — Telephone Encounter (Signed)
Sent Rx to pharmacy  

## 2011-02-25 ENCOUNTER — Ambulatory Visit (INDEPENDENT_AMBULATORY_CARE_PROVIDER_SITE_OTHER): Payer: PRIVATE HEALTH INSURANCE | Admitting: Internal Medicine

## 2011-02-25 ENCOUNTER — Encounter: Payer: Self-pay | Admitting: Internal Medicine

## 2011-02-25 VITALS — BP 140/80 | HR 105 | Temp 98.4°F | Ht 64.0 in | Wt 220.0 lb

## 2011-02-25 DIAGNOSIS — M797 Fibromyalgia: Secondary | ICD-10-CM

## 2011-02-25 DIAGNOSIS — E1149 Type 2 diabetes mellitus with other diabetic neurological complication: Secondary | ICD-10-CM

## 2011-02-25 DIAGNOSIS — I10 Essential (primary) hypertension: Secondary | ICD-10-CM

## 2011-02-25 DIAGNOSIS — E1142 Type 2 diabetes mellitus with diabetic polyneuropathy: Secondary | ICD-10-CM

## 2011-02-25 DIAGNOSIS — G909 Disorder of the autonomic nervous system, unspecified: Secondary | ICD-10-CM

## 2011-02-25 DIAGNOSIS — IMO0001 Reserved for inherently not codable concepts without codable children: Secondary | ICD-10-CM

## 2011-02-25 DIAGNOSIS — E669 Obesity, unspecified: Secondary | ICD-10-CM

## 2011-02-25 DIAGNOSIS — M1990 Unspecified osteoarthritis, unspecified site: Secondary | ICD-10-CM

## 2011-02-25 LAB — BASIC METABOLIC PANEL
BUN: 23 mg/dL (ref 6–23)
Calcium: 9.1 mg/dL (ref 8.4–10.5)
Creatinine, Ser: 1.1 mg/dL (ref 0.4–1.2)
GFR: 53.39 mL/min — ABNORMAL LOW (ref >60.00)
Potassium: 4.2 mEq/L (ref 3.5–5.1)

## 2011-02-25 LAB — CBC WITH DIFFERENTIAL/PLATELET
Basophils Absolute: 0 10*3/uL (ref 0.0–0.1)
Eosinophils Relative: 1.6 % (ref 0.0–5.0)
HCT: 30.7 % — ABNORMAL LOW (ref 36.0–46.0)
Lymphocytes Relative: 18.7 % (ref 12.0–46.0)
Monocytes Relative: 5.6 % (ref 3.0–12.0)
Neutrophils Relative %: 73.6 % (ref 43.0–77.0)
Platelets: 201 10*3/uL (ref 150.0–400.0)
WBC: 7.7 10*3/uL (ref 4.5–10.5)

## 2011-02-25 LAB — MICROALBUMIN / CREATININE URINE RATIO: Microalb Creat Ratio: 1.1 mg/g (ref 0.0–30.0)

## 2011-02-25 LAB — LIPID PANEL
Cholesterol: 148 mg/dL (ref 0–200)
HDL: 42.4 mg/dL (ref >39.00)
LDL Cholesterol: 77 mg/dL (ref 0–99)
VLDL: 28.2 mg/dL (ref 0.0–40.0)

## 2011-02-25 LAB — HEPATIC FUNCTION PANEL
AST: 26 U/L (ref 0–37)
Alkaline Phosphatase: 57 U/L (ref 39–117)
Bilirubin, Direct: 0.1 mg/dL (ref 0.0–0.3)
Total Bilirubin: 0.4 mg/dL (ref 0.3–1.2)

## 2011-02-25 LAB — TSH: TSH: 2.3 u[IU]/mL (ref 0.35–5.50)

## 2011-02-25 NOTE — Progress Notes (Signed)
Subjective:    Patient ID: [name], female    DOB: [birth year], 71 y.o.   MRN: [id number]  HPI Doing okay Having more problems with her left knee May be considering TKR this year---would go to [facility] Uses the celebrex---ready for another cortisone shot  Still doesn't check sugars No hypoglycemic reactions unless she is late for meal Uses gabapentin for her neuropathy  Fibromyalgia controlled with the tizanidine and venlafaxine Sleeps better with the tizanidine  No asthma problems since leaving the beach  No chest pain No SOB No chronic cough No edema No palpitations  Current Outpatient Prescriptions on File Prior to Visit  Medication Sig Dispense Refill  . aspirin 81 MG tablet Take 81 mg by mouth daily.        . Calcium Carbonate (CALCIUM 500 PO) Take 1 tablet by mouth daily.        . CELEBREX 200 MG capsule TAKE ONE CAPSULE BY MOUTH EVERY DAY  90 each  3  . cetirizine (ZYRTEC) 10 MG tablet Take 10 mg by mouth daily as needed.        . cholecalciferol (VITAMIN D) 1000 UNITS tablet Take 1,000 Units by mouth daily.        Marland Kitchen doxazosin (CARDURA) 8 MG tablet TAKE ONE-HALF TABLET BY MOUTH TWICE DAILY  90 tablet  3  . estradiol (ESTRACE) 0.5 MG tablet TAKE ONE TABLET BY MOUTH EVERY DAY FOR  MENOPAUSAL  SYMPTOMS  90 tablet  3  . gabapentin (NEURONTIN) 100 MG capsule Take one by mouth twice a day  180 capsule  1  . glipiZIDE (GLUCOTROL XL) 5 MG 24 hr tablet TAKE ONE TABLET BY MOUTH EVERY DAY BEFORE BREAKFAST.  90 tablet  3  . losartan-hydrochlorothiazide (HYZAAR) 100-25 MG per tablet Take one by mouth daily  90 tablet  3  . metFORMIN (GLUCOPHAGE) 1000 MG tablet Take 1 tablet (1,000 mg total) by mouth 2 (two) times daily with a meal.  60 tablet  6  . omeprazole (PRILOSEC) 20 MG capsule Take 20 mg by mouth 2 (two) times daily. For acid reflux        . Potassium 75 MG TABS Take 1 tablet by mouth daily.        Marland Kitchen tiZANidine (ZANAFLEX) 4 MG tablet Take 1 tablet (4 mg total) by mouth at  bedtime as needed.  120 tablet  0  . venlafaxine (EFFEXOR-XR) 75 MG 24 hr capsule Take one by mouth daily  90 capsule  3  . vitamin B-12 (CYANOCOBALAMIN) 500 MCG tablet Take 500 mcg by mouth daily.         Current Facility-Administered Medications on File Prior to Visit  Medication Dose Route Frequency Provider Last Rate Last Dose  . triamcinolone acetonide (KENALOG-40) injection 40 mg  40 mg Intra-articular Once Varney Baas, MD        Allergies  Allergen Reactions  . Fluoxetine Hcl     REACTION: raised blood pressure  . Lisinopril     REACTION: dizzy  . Sertraline Hcl     REACTION: dizziness    Past Medical History  Diagnosis Date  . Asthma   . Diabetes mellitus     type 2  . Hypertension   . Fibromyalgia   . Osteoarthritis   . Depression   . Anal fissure   . Arthritis   . Obesity   . GERD (gastroesophageal reflux disease)   . Allergic rhinitis     Past Surgical History  Procedure  Date  . Cataract extraction, bilateral   . Cholecystectomy 4/06  . Abdominal hysterectomy 1970's  . Tubal ligation 1970's  . Ventral hernia repair 9/08    [facility]    Family History  Problem Relation Age of Onset  . Heart attack Father   . Kidney cancer Mother   . Lung cancer Brother   . Liver cancer Brother   . Coronary artery disease Father   . Hypertension Neg Hx   . Diabetes Other     Maternal side  . Breast cancer Neg Hx   . Colon cancer Neg Hx     History   Social History  . Marital Status: Divorced    Spouse Name: N/A    Number of Children: 3  . Years of Education: N/A   Occupational History  . Homemaker    Social History Main Topics  . Smoking status: Never Smoker   . Smokeless tobacco: Never Used  . Alcohol Use: No  . Drug Use: Not on file  . Sexually Active: Not on file   Other Topics Concern  . Not on file   Social History Narrative  . No narrative on file   Review of Systems Has cut down estrogen to 5 days per week---discussed trying to  wean further    Objective:   Physical Exam  Constitutional: She appears well-developed and well-nourished. No distress.  Neck: Normal range of motion. Neck supple. No thyromegaly present.  Cardiovascular: Normal rate, regular rhythm, normal heart sounds and intact distal pulses.  Exam reveals no gallop.   No murmur heard. Pulmonary/Chest: Effort normal and breath sounds normal. No respiratory distress. She has no wheezes. She has no rales.  Musculoskeletal: She exhibits no edema and no tenderness.  Lymphadenopathy:    She has no cervical adenopathy.  Skin: No rash noted.       No foot lesions  Psychiatric: She has a normal mood and affect. Her behavior is normal. Thought content normal.          Assessment & Plan:

## 2011-02-25 NOTE — Assessment & Plan Note (Signed)
Needs knee injection Will schedule

## 2011-02-25 NOTE — Assessment & Plan Note (Signed)
Does okay with tizanidine and venlafaxine

## 2011-02-25 NOTE — Assessment & Plan Note (Signed)
BP Readings from Last 3 Encounters:  02/25/11 140/80  08/25/10 110/70  02/21/10 130/80   Good control No changes needed

## 2011-02-25 NOTE — Patient Instructions (Signed)
Please set up 15 minute appt for knee injection at your convenience

## 2011-02-25 NOTE — Assessment & Plan Note (Signed)
Discussed improved lifestyle 

## 2011-02-25 NOTE — Assessment & Plan Note (Signed)
Has had not really acceptable control and doesn't check Lab Results  Component Value Date   HGBA1C 8.4* 08/25/2010   Will check labs

## 2011-03-03 ENCOUNTER — Encounter: Payer: Self-pay | Admitting: Internal Medicine

## 2011-03-03 ENCOUNTER — Ambulatory Visit (INDEPENDENT_AMBULATORY_CARE_PROVIDER_SITE_OTHER): Payer: PRIVATE HEALTH INSURANCE | Admitting: Internal Medicine

## 2011-03-03 DIAGNOSIS — J069 Acute upper respiratory infection, unspecified: Secondary | ICD-10-CM

## 2011-03-03 DIAGNOSIS — M1710 Unilateral primary osteoarthritis, unspecified knee: Secondary | ICD-10-CM

## 2011-03-03 NOTE — Progress Notes (Signed)
Subjective:    Patient ID: [name], female    DOB: 1940-06-17, 72 y.o.   MRN: [id number]  HPI Here for knee injection  Actually got sick 3 days ago also Cough--chest hurts from drainage and cough PND with head congestion Some headache Feels cold No fever Achy Slight sore throat and ear pain  No meds for this Current Outpatient Prescriptions on File Prior to Visit  Medication Sig Dispense Refill  . aspirin 81 MG tablet Take 81 mg by mouth daily.        . Calcium Carbonate (CALCIUM 500 PO) Take 1 tablet by mouth daily.        . CELEBREX 200 MG capsule TAKE ONE CAPSULE BY MOUTH EVERY DAY  90 each  3  . cetirizine (ZYRTEC) 10 MG tablet Take 10 mg by mouth daily as needed.        . cholecalciferol (VITAMIN D) 1000 UNITS tablet Take 1,000 Units by mouth daily.        Marland Kitchen doxazosin (CARDURA) 8 MG tablet TAKE ONE-HALF TABLET BY MOUTH TWICE DAILY  90 tablet  3  . estradiol (ESTRACE) 0.5 MG tablet TAKE ONE TABLET BY MOUTH EVERY DAY FOR  MENOPAUSAL  SYMPTOMS  90 tablet  3  . gabapentin (NEURONTIN) 100 MG capsule Take one by mouth twice a day  180 capsule  1  . glipiZIDE (GLUCOTROL XL) 5 MG 24 hr tablet TAKE ONE TABLET BY MOUTH EVERY DAY BEFORE BREAKFAST.  90 tablet  3  . losartan-hydrochlorothiazide (HYZAAR) 100-25 MG per tablet Take one by mouth daily  90 tablet  3  . metFORMIN (GLUCOPHAGE) 1000 MG tablet Take 1 tablet (1,000 mg total) by mouth 2 (two) times daily with a meal.  60 tablet  6  . omeprazole (PRILOSEC) 20 MG capsule Take 20 mg by mouth 2 (two) times daily. For acid reflux        . Potassium 75 MG TABS Take 1 tablet by mouth daily.        Marland Kitchen tiZANidine (ZANAFLEX) 4 MG tablet Take 1 tablet (4 mg total) by mouth at bedtime as needed.  120 tablet  0  . venlafaxine (EFFEXOR-XR) 75 MG 24 hr capsule Take one by mouth daily  90 capsule  3  . vitamin B-12 (CYANOCOBALAMIN) 500 MCG tablet Take 500 mcg by mouth daily.         Current Facility-Administered Medications on File Prior to  Visit  Medication Dose Route Frequency Provider Last Rate Last Dose  . triamcinolone acetonide (KENALOG-40) injection 40 mg  40 mg Intra-articular Once Varney Baas, MD        Allergies  Allergen Reactions  . Fluoxetine Hcl     REACTION: raised blood pressure  . Lisinopril     REACTION: dizzy  . Sertraline Hcl     REACTION: dizziness    Past Medical History  Diagnosis Date  . Asthma   . Diabetes mellitus     type 2  . Hypertension   . Fibromyalgia   . Osteoarthritis   . Depression   . Anal fissure   . Arthritis   . Obesity   . GERD (gastroesophageal reflux disease)   . Allergic rhinitis     Past Surgical History  Procedure Date  . Cataract extraction, bilateral   . Cholecystectomy 4/06  . Abdominal hysterectomy 1970's  . Tubal ligation 1970's  . Ventral hernia repair 9/08    [place]    Family History  Problem Relation  Age of Onset  . Heart attack Father   . Kidney cancer Mother   . Lung cancer Brother   . Liver cancer Brother   . Coronary artery disease Father   . Hypertension Neg Hx   . Diabetes Other     Maternal side  . Breast cancer Neg Hx   . Colon cancer Neg Hx     History   Social History  . Marital Status: Divorced    Spouse Name: N/A    Number of Children: 3  . Years of Education: N/A   Occupational History  . Homemaker    Social History Main Topics  . Smoking status: Never Smoker   . Smokeless tobacco: Never Used  . Alcohol Use: No  . Drug Use: Not on file  . Sexually Active: Not on file   Other Topics Concern  . Not on file   Social History Narrative  . No narrative on file   Review of Systems No vomiting Regular loose stools without change Appetite is okay     Objective:   Physical Exam  Constitutional: She appears well-developed and well-nourished. No distress.  HENT:  Mouth/Throat: Oropharynx is clear and moist. No oropharyngeal exudate.       No sinus tenderness TMs normal Moderate nasal inflammation    Neck: Normal range of motion. Neck supple.  Pulmonary/Chest: Effort normal and breath sounds normal. No respiratory distress. She has no wheezes. She has no rales.  Lymphadenopathy:    She has no cervical adenopathy.  Skin: No rash noted.          Assessment & Plan:

## 2011-03-03 NOTE — Assessment & Plan Note (Signed)
Clearly seems to have viral URI Could be attentuated flu Discussed supportive care If not better next week, would try empiric antibiotic

## 2011-03-03 NOTE — Assessment & Plan Note (Signed)
Procedure  Sterile prep to medial left knee 2cc 1% lido for local 40mg  kenalog and 7cc 2% lidocaine instilled in knee Tolerated well Discussed home care

## 2011-03-09 ENCOUNTER — Ambulatory Visit: Payer: PRIVATE HEALTH INSURANCE | Admitting: Internal Medicine

## 2011-03-09 ENCOUNTER — Telehealth: Payer: Self-pay | Admitting: Internal Medicine

## 2011-03-09 MED ORDER — AMOXICILLIN 500 MG PO CAPS: 1000.0000 mg | ORAL_CAPSULE | Freq: Two times a day (BID) | ORAL | Status: DC

## 2011-03-09 NOTE — Telephone Encounter (Signed)
Triage Record Num: [id number] Operator: Freddie Breech Patient Name: [name] Call Date & Time: 03/09/2011 8:44:43AM Patient Phone: [phone] PCP: Tillman Abide Patient Gender: Female PCP Fax : [phone] Patient DOB: [birth year] Practice Name: [facility] Reason for Call: Caller: [name]/Patient; PCP: Tillman Abide I.; CB#: [phone]; Call regarding flu symptoms onset x 2 wks. OV on 03/03/11 for flu sx. Now coughing up green phlegm, body aches. Afebrile per pt. Appt sched for 1600 tody with Dr. Alphonsus Sias per [facility] Sx Protocol. Protocol(s) Used: Flu-Like Symptoms Recommended Outcome per Protocol: Call Provider within 8 Hours Override Outcome if Used in Protocol: See Provider within 24 hours RN Reason for Override Outcome: Office Is Open. Reason for Outcome: Evaluated by provider AND symptoms worsening when following recommended treatment plan for 48 hours Care Advice: ~ Use a cool mist humidifier to moisten air. Be sure to clean according to manufacturer's instructions. ~ IMMEDIATE ACTION Coughing up mucus or phlegm helps to get rid of an infection. A productive cough should not be stopped. A cough medicine with guaifenesin (Robitussin, Mucinex) can help loosen the mucus. Cough medicine with dextromethorphan (DM) should be avoided. Drinking lots of fluids can help loosen the mucus too, especially warm fluids. ~ 03/09/2011 8:58:09AM Page 1 of 1 [id number]

## 2011-03-09 NOTE — Telephone Encounter (Signed)
Please call her After last week's visit, I had planned trying antibiotic if she worsened Can cancel 4PM visit unless she wants to keep it  Amoxicillin 500mg   2 bid #40 x 0

## 2011-03-09 NOTE — Telephone Encounter (Signed)
rx sent to pharmacy by e-script Spoke with patient and advised results appt canceled

## 2011-03-16 ENCOUNTER — Other Ambulatory Visit: Payer: Self-pay | Admitting: Internal Medicine

## 2011-05-19 ENCOUNTER — Other Ambulatory Visit: Payer: Self-pay | Admitting: Internal Medicine

## 2011-06-03 ENCOUNTER — Other Ambulatory Visit: Payer: Self-pay | Admitting: Internal Medicine

## 2011-07-05 ENCOUNTER — Other Ambulatory Visit: Payer: Self-pay | Admitting: Internal Medicine

## 2011-08-04 ENCOUNTER — Other Ambulatory Visit: Payer: Self-pay | Admitting: *Deleted

## 2011-08-04 MED ORDER — TIZANIDINE HCL 4 MG PO CAPS: ORAL_CAPSULE | ORAL | Status: DC

## 2011-08-04 NOTE — Telephone Encounter (Signed)
Faxed refill request from [facility] [address].  Last filled 07/07/11.

## 2011-08-21 ENCOUNTER — Other Ambulatory Visit: Payer: Self-pay | Admitting: *Deleted

## 2011-08-21 MED ORDER — METFORMIN HCL 1000 MG PO TABS: 1000.0000 mg | ORAL_TABLET | Freq: Two times a day (BID) | ORAL | Status: DC

## 2011-08-25 ENCOUNTER — Ambulatory Visit (INDEPENDENT_AMBULATORY_CARE_PROVIDER_SITE_OTHER): Payer: PRIVATE HEALTH INSURANCE | Admitting: Internal Medicine

## 2011-08-25 ENCOUNTER — Encounter: Payer: Self-pay | Admitting: Internal Medicine

## 2011-08-25 VITALS — BP 122/80 | HR 93 | Temp 98.3°F | Ht 64.0 in | Wt 219.0 lb

## 2011-08-25 DIAGNOSIS — IMO0001 Reserved for inherently not codable concepts without codable children: Secondary | ICD-10-CM

## 2011-08-25 DIAGNOSIS — F329 Major depressive disorder, single episode, unspecified: Secondary | ICD-10-CM

## 2011-08-25 DIAGNOSIS — IMO0002 Reserved for concepts with insufficient information to code with codable children: Secondary | ICD-10-CM

## 2011-08-25 DIAGNOSIS — Z0000 Encounter for general adult medical examination without abnormal findings: Secondary | ICD-10-CM

## 2011-08-25 DIAGNOSIS — M1710 Unilateral primary osteoarthritis, unspecified knee: Secondary | ICD-10-CM

## 2011-08-25 DIAGNOSIS — I10 Essential (primary) hypertension: Secondary | ICD-10-CM

## 2011-08-25 DIAGNOSIS — Z1231 Encounter for screening mammogram for malignant neoplasm of breast: Secondary | ICD-10-CM

## 2011-08-25 DIAGNOSIS — M797 Fibromyalgia: Secondary | ICD-10-CM

## 2011-08-25 DIAGNOSIS — E1142 Type 2 diabetes mellitus with diabetic polyneuropathy: Secondary | ICD-10-CM

## 2011-08-25 DIAGNOSIS — E1149 Type 2 diabetes mellitus with other diabetic neurological complication: Secondary | ICD-10-CM

## 2011-08-25 DIAGNOSIS — F3289 Other specified depressive episodes: Secondary | ICD-10-CM

## 2011-08-25 DIAGNOSIS — G909 Disorder of the autonomic nervous system, unspecified: Secondary | ICD-10-CM

## 2011-08-25 LAB — HEMOGLOBIN A1C: Hgb A1c MFr Bld: 8.3 % — ABNORMAL HIGH (ref 4.6–6.5)

## 2011-08-25 NOTE — Assessment & Plan Note (Signed)
Hopefully still good control Will check A1c 

## 2011-08-25 NOTE — Patient Instructions (Signed)
Please set up 15 minute visit for knee injection at your convenience

## 2011-08-25 NOTE — Progress Notes (Signed)
Subjective:    Patient ID: [name], female    DOB: Jul 10, 1940, 71 y.o.   MRN: [id number]  HPI Here for physical Ongoing problems with left knee. Some trouble with right also Feels she needs TKR but not ready Ex-husband is dying and this is strain for children (who would need to help her post-op)  Not checking sugars No hypoglycemic reactions unless she misses a meal--has food with her to take then  Has arthritis in hands--has narrowing in flexion tendon sheaths in palm Fibromyalgia pain is reasonably controlled  Current Outpatient Prescriptions on File Prior to Visit  Medication Sig Dispense Refill  . aspirin 81 MG tablet Take 81 mg by mouth daily.        . Calcium Carbonate (CALCIUM 500 PO) Take 1 tablet by mouth daily.        . CELEBREX 200 MG capsule TAKE ONE CAPSULE BY MOUTH EVERY DAY  90 each  3  . cetirizine (ZYRTEC) 10 MG tablet Take 10 mg by mouth daily as needed.        . cholecalciferol (VITAMIN D) 1000 UNITS tablet Take 1,000 Units by mouth daily.        Marland Kitchen doxazosin (CARDURA) 8 MG tablet TAKE ONE-HALF TABLET BY MOUTH TWICE DAILY  90 tablet  3  . estradiol (ESTRACE) 0.5 MG tablet TAKE ONE TABLET BY MOUTH EVERY DAY FOR  MENOPAUSAL  SYMPTOMS  90 tablet  3  . gabapentin (NEURONTIN) 100 MG capsule Take one by mouth twice a day  180 capsule  1  . glipiZIDE (GLUCOTROL XL) 5 MG 24 hr tablet TAKE ONE TABLET BY MOUTH EVERY DAY BEFORE BREAKFAST.  90 tablet  3  . losartan-hydrochlorothiazide (HYZAAR) 100-25 MG per tablet Take one by mouth daily  90 tablet  3  . metFORMIN (GLUCOPHAGE) 1000 MG tablet Take 1 tablet (1,000 mg total) by mouth 2 (two) times daily with a meal.  60 tablet  6  . omeprazole (PRILOSEC) 20 MG capsule TAKE ONE CAPSULE BY MOUTH TWICE DAILY FOR ACID REFLUX  60 capsule  11  . Potassium 75 MG TABS Take 1 tablet by mouth daily.        Marland Kitchen tiZANidine (ZANAFLEX) 4 MG capsule Take one tablet by mouth at bedtime as needed.  30 capsule  0  . venlafaxine (EFFEXOR-XR) 75  MG 24 hr capsule Take one by mouth daily  90 capsule  3  . vitamin B-12 (CYANOCOBALAMIN) 500 MCG tablet Take 500 mcg by mouth daily.         Current Facility-Administered Medications on File Prior to Visit  Medication Dose Route Frequency Provider Last Rate Last Dose  . DISCONTD: triamcinolone acetonide (KENALOG-40) injection 40 mg  40 mg Intra-articular Once Karie Schwalbe, MD        Allergies  Allergen Reactions  . Fluoxetine Hcl     REACTION: raised blood pressure  . Lisinopril     REACTION: dizzy  . Sertraline Hcl     REACTION: dizziness    Past Medical History  Diagnosis Date  . Asthma   . Diabetes mellitus     type 2  . Hypertension   . Fibromyalgia   . Osteoarthritis   . Depression   . Anal fissure   . Arthritis   . Obesity   . GERD (gastroesophageal reflux disease)   . Allergic rhinitis     Past Surgical History  Procedure Date  . Cataract extraction, bilateral   . Cholecystectomy 4/06  .  Abdominal hysterectomy 1970's  . Tubal ligation 1970's  . Ventral hernia repair 9/08    [place]    Family History  Problem Relation Age of Onset  . Heart attack Father   . Kidney cancer Mother   . Lung cancer Brother   . Liver cancer Brother   . Coronary artery disease Father   . Hypertension Neg Hx   . Diabetes Other     Maternal side  . Breast cancer Neg Hx   . Colon cancer Neg Hx     History   Social History  . Marital Status: Divorced    Spouse Name: N/A    Number of Children: 3  . Years of Education: N/A   Occupational History  . Homemaker    Social History Main Topics  . Smoking status: Never Smoker   . Smokeless tobacco: Never Used  . Alcohol Use: No  . Drug Use: Not on file  . Sexually Active: Not on file   Other Topics Concern  . Not on file   Social History Narrative   No living willNo health care POA but would want son, Casimiro Needle, to make decisions about her care if neededWould accept resuscitation attemptsProbably wouldn't want tube  feeds if cognitively unaware   Review of Systems  Constitutional: Negative for fatigue.       Weight up a few pounds Wears seat belt  HENT: Negative for hearing loss, congestion, rhinorrhea, dental problem and tinnitus.        Regular with dentist  Eyes: Negative for visual disturbance.       No diplopia or unilateral vision loss  Respiratory: Negative for cough, chest tightness and shortness of breath.   Cardiovascular: Negative for chest pain, palpitations and leg swelling.  Gastrointestinal: Positive for diarrhea. Negative for nausea, vomiting, abdominal pain, blood in stool and anal bleeding.       Heartburn is controlled with omeprazole daily  Genitourinary: Negative for dysuria and difficulty urinating.       No incontinence No sexual problems  Musculoskeletal: Positive for back pain and arthralgias. Negative for joint swelling.  Skin: Negative for rash.       Recurrent place of scaling on right cheek Due for derm visit  Neurological: Positive for dizziness. Negative for syncope, weakness, light-headedness, numbness and headaches.       Occ mild orthostatic dizziness  Hematological: Positive for adenopathy. Does not bruise/bleed easily.       Sore on right side of neck--?gland (may be just muscle pull)  Psychiatric/Behavioral: Positive for disturbed wake/sleep cycle and dysphoric mood. The patient is nervous/anxious.        Chronic sleep problems--gets some sleep with the tizanidine Ongoing intermittent depression--still on effexor       Objective:   Physical Exam  Constitutional: She is oriented to person, place, and time. She appears well-developed and well-nourished. No distress.  HENT:  Head: Normocephalic and atraumatic.  Right Ear: External ear normal.  Left Ear: External ear normal.  Mouth/Throat: Oropharynx is clear and moist.  Eyes: Conjunctivae and EOM are normal. Pupils are equal, round, and reactive to light.  Neck: Normal range of motion. Neck supple. No  thyromegaly present.  Cardiovascular: Normal rate, regular rhythm, normal heart sounds and intact distal pulses.  Exam reveals no gallop.   No murmur heard. Pulmonary/Chest: Effort normal and breath sounds normal. No respiratory distress. She has no wheezes. She has no rales.  Abdominal: Soft. There is no tenderness.  Genitourinary:  Mild periareolar cystic changes in breasts--no masses  Musculoskeletal: She exhibits no edema and no tenderness.  Lymphadenopathy:    She has no cervical adenopathy.    She has no axillary adenopathy.  Neurological: She is alert and oriented to person, place, and time.  Skin: No rash noted. No erythema.  Psychiatric: She has a normal mood and affect. Her behavior is normal.          Assessment & Plan:

## 2011-08-25 NOTE — Assessment & Plan Note (Signed)
Ongoing pain  No change in regimen

## 2011-08-25 NOTE — Assessment & Plan Note (Signed)
Needs to exercise but wont Discussed water aerobics Due for mammo

## 2011-08-25 NOTE — Assessment & Plan Note (Signed)
BP Readings from Last 3 Encounters:  08/25/11 122/80  03/03/11 138/78  02/25/11 140/80   Good control No changes Lab Results  Component Value Date   CREATININE 1.1 02/25/2011

## 2011-08-25 NOTE — Assessment & Plan Note (Signed)
Lots of stressors Continues on the venlafaxine

## 2011-08-27 ENCOUNTER — Encounter: Payer: Self-pay | Admitting: Internal Medicine

## 2011-08-27 ENCOUNTER — Ambulatory Visit (INDEPENDENT_AMBULATORY_CARE_PROVIDER_SITE_OTHER): Payer: PRIVATE HEALTH INSURANCE | Admitting: Internal Medicine

## 2011-08-27 VITALS — BP 110/70

## 2011-08-27 DIAGNOSIS — M1710 Unilateral primary osteoarthritis, unspecified knee: Secondary | ICD-10-CM

## 2011-08-27 NOTE — Progress Notes (Signed)
  Subjective:    Patient ID: [name], female    DOB: [birth year], 71 y.o.   MRN: [id number]  HPI Here for left knee injection   Review of Systems     Objective:   Physical Exam        Assessment & Plan:

## 2011-08-27 NOTE — Assessment & Plan Note (Signed)
PROCEDURE  Sterile prep to left knee Medial approach 2cc 2% plain lidocaine local 40mg  depomedrol and 9cc 25 plain lidocaine instilled in knee without difficulty Tolerated well Discussed home care

## 2011-09-01 ENCOUNTER — Other Ambulatory Visit: Payer: Self-pay | Admitting: Family Medicine

## 2011-09-02 ENCOUNTER — Other Ambulatory Visit: Payer: Self-pay | Admitting: Family Medicine

## 2011-09-09 ENCOUNTER — Other Ambulatory Visit: Payer: Self-pay | Admitting: Internal Medicine

## 2011-09-10 ENCOUNTER — Ambulatory Visit: Payer: Self-pay | Admitting: Internal Medicine

## 2011-09-17 ENCOUNTER — Encounter: Payer: Self-pay | Admitting: Internal Medicine

## 2011-09-22 ENCOUNTER — Ambulatory Visit: Payer: Self-pay | Admitting: Internal Medicine

## 2011-09-28 ENCOUNTER — Encounter: Payer: Self-pay | Admitting: Internal Medicine

## 2011-09-29 ENCOUNTER — Other Ambulatory Visit: Payer: Self-pay | Admitting: Internal Medicine

## 2011-09-29 DIAGNOSIS — N6320 Unspecified lump in the left breast, unspecified quadrant: Secondary | ICD-10-CM | POA: Insufficient documentation

## 2011-10-13 ENCOUNTER — Other Ambulatory Visit: Payer: Self-pay | Admitting: Internal Medicine

## 2011-10-29 ENCOUNTER — Other Ambulatory Visit: Payer: Self-pay | Admitting: Family Medicine

## 2011-10-29 ENCOUNTER — Other Ambulatory Visit: Payer: Self-pay | Admitting: Internal Medicine

## 2011-10-29 NOTE — Telephone Encounter (Signed)
Okay #30 x 1 

## 2011-10-30 NOTE — Telephone Encounter (Signed)
rx sent to pharmacy by e-script  

## 2011-11-26 ENCOUNTER — Ambulatory Visit: Payer: PRIVATE HEALTH INSURANCE | Admitting: Internal Medicine

## 2011-11-26 DIAGNOSIS — Z0289 Encounter for other administrative examinations: Secondary | ICD-10-CM

## 2012-01-08 ENCOUNTER — Other Ambulatory Visit: Payer: Self-pay | Admitting: Internal Medicine

## 2012-02-10 ENCOUNTER — Other Ambulatory Visit: Payer: Self-pay | Admitting: Internal Medicine

## 2012-02-15 ENCOUNTER — Other Ambulatory Visit: Payer: Self-pay | Admitting: *Deleted

## 2012-02-15 MED ORDER — METFORMIN HCL 1000 MG PO TABS: 1000.0000 mg | ORAL_TABLET | Freq: Two times a day (BID) | ORAL | Status: DC

## 2012-02-22 ENCOUNTER — Telehealth: Payer: Self-pay | Admitting: Internal Medicine

## 2012-02-22 ENCOUNTER — Ambulatory Visit (INDEPENDENT_AMBULATORY_CARE_PROVIDER_SITE_OTHER): Payer: PRIVATE HEALTH INSURANCE | Admitting: Internal Medicine

## 2012-02-22 ENCOUNTER — Encounter: Payer: Self-pay | Admitting: Internal Medicine

## 2012-02-22 VITALS — BP 150/80 | HR 88 | Temp 98.6°F | Wt 220.0 lb

## 2012-02-22 DIAGNOSIS — I10 Essential (primary) hypertension: Secondary | ICD-10-CM

## 2012-02-22 DIAGNOSIS — E1122 Type 2 diabetes mellitus with diabetic chronic kidney disease: Secondary | ICD-10-CM | POA: Insufficient documentation

## 2012-02-22 DIAGNOSIS — E1140 Type 2 diabetes mellitus with diabetic neuropathy, unspecified: Secondary | ICD-10-CM | POA: Insufficient documentation

## 2012-02-22 DIAGNOSIS — E1149 Type 2 diabetes mellitus with other diabetic neurological complication: Secondary | ICD-10-CM

## 2012-02-22 DIAGNOSIS — J069 Acute upper respiratory infection, unspecified: Secondary | ICD-10-CM | POA: Insufficient documentation

## 2012-02-22 LAB — HEMOGLOBIN A1C: Hgb A1c MFr Bld: 8.9 % — ABNORMAL HIGH (ref 4.6–6.5)

## 2012-02-22 MED ORDER — AMOXICILLIN 500 MG PO TABS: 1000.0000 mg | ORAL_TABLET | Freq: Two times a day (BID) | ORAL | Status: DC

## 2012-02-22 NOTE — Telephone Encounter (Signed)
Patient Information:  Caller Name: Iolanda  Phone: [phone]  Patient: [name], [name]  Gender: Female  DOB: [birth year]  Age: 72 Years  PCP: Tillman Abide [facility])  Office Follow Up:  Does the office need to follow up with this patient?: No  Instructions For The Office: N/A   Symptoms  Reason For Call & Symptoms: Pt is calling for an appt/put through to the nurse. Pt has a sore throat and painful ears .  Reviewed Health History In EMR: Yes  Reviewed Medications In EMR: Yes  Reviewed Allergies In EMR: Yes  Reviewed Surgeries / Procedures: Yes  Date of Onset of Symptoms: 02/20/2012  Treatments Tried: warm liquids; Tylenol  Treatments Tried Worked: No  Guideline(s) Used:  Sore Throat  Disposition Per Guideline:   See Today in Office  Reason For Disposition Reached:   Earache also present  Advice Given:  N/A  Appointment Scheduled:  02/22/2012 14:00:00 Appointment Scheduled Provider:  Tillman Abide [facility])

## 2012-02-22 NOTE — Telephone Encounter (Signed)
Will evaluate at her appt

## 2012-02-22 NOTE — Assessment & Plan Note (Signed)
Seems viral now ?early sinus symptoms and sudden worsening in the past 2 days Will give amoxil to take if worsens

## 2012-02-22 NOTE — Assessment & Plan Note (Signed)
Lab Results  Component Value Date   HGBA1C 8.3* 08/25/2011   May have to add another med Doesn't seem to have followed lifestyle recommendations (is limited with activity due to her bad knee) If worse, will start januvia---if stable, will wait till next appt

## 2012-02-22 NOTE — Progress Notes (Signed)
Subjective:    Patient ID: [name], female    DOB: 10/24/1940, 72 y.o.   MRN: [id number]  HPI Having a sore throat Ringing and pain in left ear. Pain in right ear Runny nose Head pain Didn't feel well last week --like a cold Now worse--with the bad sore throat for 2 days Very little cough Not SOB Some photophobia  Hasn't tried any meds besides tylenol--not really helping  Trying to watch diet Weight is not done Not checking sugars  No chest pain No palpitations No edema Some orthostatic dizziness with this illness No syncope  Went to orthopedist Worsened with joint injection Feels she needs to probably get the TKR  Current Outpatient Prescriptions on File Prior to Visit  Medication Sig Dispense Refill  . aspirin 81 MG tablet Take 81 mg by mouth daily.        . Calcium Carbonate (CALCIUM 500 PO) Take 1 tablet by mouth daily.        . CELEBREX 200 MG capsule TAKE ONE CAPSULE BY MOUTH EVERY DAY  90 capsule  2  . cetirizine (ZYRTEC) 10 MG tablet Take 10 mg by mouth daily as needed.        . cholecalciferol (VITAMIN D) 1000 UNITS tablet Take 1,000 Units by mouth daily.        Marland Kitchen doxazosin (CARDURA) 4 MG tablet Take 1 tablet (4 mg total) by mouth 2 (two) times daily.  180 tablet  3  . estradiol (ESTRACE) 0.5 MG tablet TAKE ONE TABLET BY MOUTH EVERY DAY FOR  MENOPAUSAL  SYMPTOMS  90 tablet  3  . gabapentin (NEURONTIN) 100 MG capsule Take one by mouth twice a day  180 capsule  1  . glipiZIDE (GLUCOTROL XL) 5 MG 24 hr tablet Take 1 tablet (5 mg total) by mouth daily.  90 tablet  3  . losartan-hydrochlorothiazide (HYZAAR) 100-25 MG per tablet Take one by mouth daily  90 tablet  3  . metFORMIN (GLUCOPHAGE) 1000 MG tablet Take 1 tablet (1,000 mg total) by mouth 2 (two) times daily with a meal.  180 tablet  1  . omeprazole (PRILOSEC) 20 MG capsule TAKE ONE CAPSULE BY MOUTH TWICE DAILY FOR ACID REFLUX  60 capsule  11  . Potassium 75 MG TABS Take 1 tablet by mouth daily.          Marland Kitchen tiZANidine (ZANAFLEX) 4 MG capsule TAKE ONE CAPSULE BY MOUTH AT BEDTIME AS NEEDED  30 capsule  0  . venlafaxine XR (EFFEXOR-XR) 75 MG 24 hr capsule Take 1 capsule (75 mg total) by mouth daily.  90 capsule  3  . vitamin B-12 (CYANOCOBALAMIN) 500 MCG tablet Take 500 mcg by mouth daily.          Allergies  Allergen Reactions  . Fluoxetine Hcl     REACTION: raised blood pressure  . Lisinopril     REACTION: dizzy  . Sertraline Hcl     REACTION: dizziness    Past Medical History  Diagnosis Date  . Asthma   . Diabetes mellitus     type 2  . Hypertension   . Fibromyalgia   . Osteoarthritis   . Depression   . Anal fissure   . Arthritis   . Obesity   . GERD (gastroesophageal reflux disease)   . Allergic rhinitis     Past Surgical History  Procedure Date  . Cataract extraction, bilateral   . Cholecystectomy 4/06  . Abdominal hysterectomy 1970's  . Tubal  ligation 1970's  . Ventral hernia repair 9/08    [place]    Family History  Problem Relation Age of Onset  . Heart attack Father   . Kidney cancer Mother   . Lung cancer Brother   . Liver cancer Brother   . Coronary artery disease Father   . Hypertension Neg Hx   . Diabetes Other     Maternal side  . Breast cancer Neg Hx   . Colon cancer Neg Hx     History   Social History  . Marital Status: Divorced    Spouse Name: N/A    Number of Children: 3  . Years of Education: N/A   Occupational History  . Homemaker    Social History Main Topics  . Smoking status: Never Smoker   . Smokeless tobacco: Never Used  . Alcohol Use: No  . Drug Use: Not on file  . Sexually Active: Not on file   Other Topics Concern  . Not on file   Social History Narrative   No living willNo health care POA but would want son, Casimiro Needle, to make decisions about her care if neededWould accept resuscitation attemptsProbably wouldn't want tube feeds if cognitively unaware   Review of Systems No rash Slight nausea but no  vomiting Limited diet but still eating    Objective:   Physical Exam  Constitutional: She appears well-developed and well-nourished. No distress.  HENT:  Mouth/Throat: Oropharynx is clear and moist. No oropharyngeal exudate.       Mild maxillary tenderness No sig nasal inflammation--mild swelling though TMs normal  Neck: Normal range of motion. Neck supple.  Cardiovascular: Normal rate, regular rhythm and normal heart sounds.  Exam reveals no gallop.   No murmur heard. Pulmonary/Chest: Effort normal and breath sounds normal. No respiratory distress. She has no wheezes. She has no rales.  Musculoskeletal: She exhibits no edema.  Lymphadenopathy:    She has no cervical adenopathy.          Assessment & Plan:

## 2012-02-22 NOTE — Patient Instructions (Signed)
Please start the antibiotic if you are worsening in the next few days.  DASH Diet The DASH diet stands for "Dietary Approaches to Stop Hypertension." It is a healthy eating plan that has been shown to reduce high blood pressure (hypertension) in as little as 14 days, while also possibly providing other significant health benefits. These other health benefits include reducing the risk of breast cancer after menopause and reducing the risk of type 2 diabetes, heart disease, colon cancer, and stroke. Health benefits also include weight loss and slowing kidney failure in patients with chronic kidney disease.  DIET GUIDELINES  Limit salt (sodium). Your diet should contain less than 1500 mg of sodium daily.  Limit refined or processed carbohydrates. Your diet should include mostly whole grains. Desserts and added sugars should be used sparingly.  Include small amounts of heart-healthy fats. These types of fats include nuts, oils, and tub margarine. Limit saturated and trans fats. These fats have been shown to be harmful in the body. CHOOSING FOODS  The following food groups are based on a 2000 calorie diet. See your Registered Dietitian for individual calorie needs. Grains and Grain Products (6 to 8 servings daily)  Eat More Often: Whole-wheat bread, brown rice, whole-grain or wheat pasta, quinoa, popcorn without added fat or salt (air popped).  Eat Less Often: White bread, white pasta, white rice, cornbread. Vegetables (4 to 5 servings daily)  Eat More Often: Fresh, frozen, and canned vegetables. Vegetables may be raw, steamed, roasted, or grilled with a minimal amount of fat.  Eat Less Often/Avoid: Creamed or fried vegetables. Vegetables in a cheese sauce. Fruit (4 to 5 servings daily)  Eat More Often: All fresh, canned (in natural juice), or frozen fruits. Dried fruits without added sugar. One hundred percent fruit juice ( cup [237 mL] daily).  Eat Less Often: Dried fruits with added sugar.  Canned fruit in light or heavy syrup. [employer], Fish, and Poultry (2 servings or less daily. One serving is 3 to 4 oz [85-114 g]).  Eat More Often: Ninety percent or leaner ground beef, tenderloin, sirloin. Round cuts of beef, chicken breast, [place] breast. All fish. Grill, bake, or broil your meat. Nothing should be fried.  Eat Less Often/Avoid: Fatty cuts of meat, [place], or chicken leg, thigh, or wing. Fried cuts of meat or fish. Dairy (2 to 3 servings)  Eat More Often: Low-fat or fat-free milk, low-fat plain or light yogurt, reduced-fat or part-skim cheese.  Eat Less Often/Avoid: Milk (whole, 2%).Whole milk yogurt. Full-fat cheeses. Nuts, Seeds, and Legumes (4 to 5 servings per week)  Eat More Often: All without added salt.  Eat Less Often/Avoid: Salted nuts and seeds, canned beans with added salt. Fats and Sweets (limited)  Eat More Often: Vegetable oils, tub margarines without trans fats, sugar-free gelatin. Mayonnaise and salad dressings.  Eat Less Often/Avoid: Coconut oils, palm oils, butter, stick margarine, cream, half and half, cookies, candy, pie. FOR MORE INFORMATION The Dash Diet Eating Plan: www.dashdiet.org Document Released: 01/15/2011 Document Revised: 04/20/2011 Document Reviewed: 01/15/2011 [facility] Patient Information 2013 [place], [place].

## 2012-02-22 NOTE — Assessment & Plan Note (Signed)
BP Readings from Last 3 Encounters:  02/22/12 150/80  08/27/11 110/70  08/25/11 122/80   Up today but she is sick No changes for now Will do full blood work at next visit

## 2012-02-26 ENCOUNTER — Ambulatory Visit: Payer: PRIVATE HEALTH INSURANCE | Admitting: Internal Medicine

## 2012-03-14 ENCOUNTER — Encounter: Payer: Self-pay | Admitting: *Deleted

## 2012-03-15 ENCOUNTER — Other Ambulatory Visit: Payer: Self-pay | Admitting: Internal Medicine

## 2012-03-15 ENCOUNTER — Telehealth: Payer: Self-pay

## 2012-03-15 MED ORDER — TIZANIDINE HCL 4 MG PO TABS: 4.0000 mg | ORAL_TABLET | Freq: Every evening | ORAL | Status: DC | PRN

## 2012-03-15 MED ORDER — VENLAFAXINE HCL ER 75 MG PO TB24: 1.0000 | ORAL_TABLET | Freq: Every day | ORAL | Status: DC

## 2012-03-15 NOTE — Telephone Encounter (Signed)
Pt left v/m; insurance will only pay if Venlafaxine HCL ER 75 mg and Tizanidine 4 mg are in tablet form; pt presently taking capsules.Pt request meds changed to tablets to [facility] [address]Please advise.

## 2012-03-15 NOTE — Telephone Encounter (Signed)
rx sent to pharmacy by e-script  

## 2012-03-15 NOTE — Telephone Encounter (Signed)
rx sent to pharmacy by e-script Med list corrected

## 2012-03-15 NOTE — Telephone Encounter (Signed)
Okay #30 x 1 

## 2012-03-15 NOTE — Telephone Encounter (Signed)
That is fine to change both to tablets and refill 1 year for venlafaxine 1 month x 1 for tizanidine

## 2012-03-17 ENCOUNTER — Other Ambulatory Visit: Payer: Self-pay | Admitting: *Deleted

## 2012-03-17 MED ORDER — SITAGLIPTIN PHOSPHATE 100 MG PO TABS: 100.0000 mg | ORAL_TABLET | Freq: Every day | ORAL | Status: DC

## 2012-03-22 ENCOUNTER — Telehealth: Payer: Self-pay

## 2012-03-22 NOTE — Telephone Encounter (Signed)
Left message on VM, advised pt to call to let me know she received the message and if she has any questions.

## 2012-03-22 NOTE — Telephone Encounter (Signed)
Let her know that the only alternative is probably the long acting once a day insulin. I will give her till the 3 month follow up to improve eating and exercise and lose some weight. If not better then, will add the insulin  Take the januvia off her list

## 2012-03-22 NOTE — Telephone Encounter (Signed)
Pt left v/m Januvia is too expensive; pt said cost was $340.00. Pt request substitute generic med for [place].[facility] [address]Please advise.

## 2012-03-26 ENCOUNTER — Other Ambulatory Visit: Payer: Self-pay

## 2012-03-31 ENCOUNTER — Telehealth: Payer: Self-pay | Admitting: *Deleted

## 2012-03-31 NOTE — Telephone Encounter (Signed)
Form done 

## 2012-03-31 NOTE — Telephone Encounter (Signed)
Form on your desk to be filled out for formulary coverage

## 2012-03-31 NOTE — Telephone Encounter (Signed)
Form faxed back and scanned 

## 2012-04-28 ENCOUNTER — Telehealth: Payer: Self-pay | Admitting: *Deleted

## 2012-04-28 ENCOUNTER — Ambulatory Visit (INDEPENDENT_AMBULATORY_CARE_PROVIDER_SITE_OTHER): Payer: PRIVATE HEALTH INSURANCE | Admitting: Internal Medicine

## 2012-04-28 ENCOUNTER — Encounter: Payer: Self-pay | Admitting: Internal Medicine

## 2012-04-28 VITALS — BP 138/80 | HR 96 | Temp 98.0°F | Wt 216.0 lb

## 2012-04-28 DIAGNOSIS — E1149 Type 2 diabetes mellitus with other diabetic neurological complication: Secondary | ICD-10-CM

## 2012-04-28 DIAGNOSIS — M7120 Synovial cyst of popliteal space [Baker], unspecified knee: Secondary | ICD-10-CM

## 2012-04-28 NOTE — Assessment & Plan Note (Signed)
Could be muscle damaged area also but probably Baker's cyst No need for treatment

## 2012-04-28 NOTE — Assessment & Plan Note (Signed)
Has made some mild improvements and weight down some Discussed proper eating again Will recheck in 3 months and do blood work then

## 2012-04-28 NOTE — Telephone Encounter (Signed)
ok 

## 2012-04-28 NOTE — Progress Notes (Signed)
Subjective:    Patient ID: [name], female    DOB: 1940-03-13, 72 y.o.   MRN: [id number]  HPI  not able to get the [place]--- too much money since it would throw her into the donut hole Has been trying to eat better Unable to be active due to her knee pain  Has lump now behind left knee-- "like hen's egg" Not specifically tender Seemed to be worse after the injection (?supartz) from Dr Gavin Potters  Having left ear tinnitus May be slightly better Feels it affects her balance Has tried some drops and they seem to have helped  Current Outpatient Prescriptions on File Prior to Visit  Medication Sig Dispense Refill  . aspirin 81 MG tablet Take 81 mg by mouth daily.        . Calcium Carbonate (CALCIUM 500 PO) Take 1 tablet by mouth daily.        . CELEBREX 200 MG capsule TAKE ONE CAPSULE BY MOUTH EVERY DAY  90 capsule  2  . cholecalciferol (VITAMIN D) 1000 UNITS tablet Take 1,000 Units by mouth daily.        Marland Kitchen gabapentin (NEURONTIN) 100 MG capsule Take one by mouth twice a day  180 capsule  1  . glipiZIDE (GLUCOTROL XL) 5 MG 24 hr tablet Take 1 tablet (5 mg total) by mouth daily.  90 tablet  3  . losartan-hydrochlorothiazide (HYZAAR) 100-25 MG per tablet Take one by mouth daily  90 tablet  3  . metFORMIN (GLUCOPHAGE) 1000 MG tablet Take 1 tablet (1,000 mg total) by mouth 2 (two) times daily with a meal.  180 tablet  1  . omeprazole (PRILOSEC) 20 MG capsule TAKE ONE CAPSULE BY MOUTH TWICE DAILY FOR ACID REFLUX  60 capsule  11  . Potassium 75 MG TABS Take 1 tablet by mouth daily.        Marland Kitchen tiZANidine (ZANAFLEX) 4 MG tablet Take 1 tablet (4 mg total) by mouth at bedtime as needed.  30 tablet  1  . Venlafaxine HCl 75 MG TB24 Take 1 tablet (75 mg total) by mouth daily.  30 each  11   No current facility-administered medications on file prior to visit.    Allergies  Allergen Reactions  . Fluoxetine Hcl     REACTION: raised blood pressure  . Lisinopril     REACTION: dizzy  .  Sertraline Hcl     REACTION: dizziness    Past Medical History  Diagnosis Date  . Asthma   . Diabetes mellitus     type 2  . Hypertension   . Fibromyalgia   . Osteoarthritis   . Depression   . Anal fissure   . Arthritis   . Obesity   . GERD (gastroesophageal reflux disease)   . Allergic rhinitis     Past Surgical History  Procedure Laterality Date  . Cataract extraction, bilateral    . Cholecystectomy  4/06  . Abdominal hysterectomy  1970's  . Tubal ligation  1970's  . Ventral hernia repair  9/08    [place]    Family History  Problem Relation Age of Onset  . Heart attack Father   . Kidney cancer Mother   . Lung cancer Brother   . Liver cancer Brother   . Coronary artery disease Father   . Hypertension Neg Hx   . Diabetes Other     Maternal side  . Breast cancer Neg Hx   . Colon cancer Neg Hx  History   Social History  . Marital Status: Divorced    Spouse Name: N/A    Number of Children: 3  . Years of Education: N/A   Occupational History  . Homemaker    Social History Main Topics  . Smoking status: Never Smoker   . Smokeless tobacco: Never Used  . Alcohol Use: No  . Drug Use: Not on file  . Sexually Active: Not on file   Other Topics Concern  . Not on file   Social History Narrative   No living will   No health care POA but would want son, Casimiro Needle, to make decisions about her care if needed   Would accept resuscitation attempts   Probably wouldn't want tube feeds if cognitively unaware   Review of Systems Lost 4# since the last visit    Objective:   Physical Exam  Constitutional: She appears well-developed and well-nourished. No distress.  Musculoskeletal:  Small mass just below lateral popliteal fossa          Assessment & Plan:

## 2012-04-28 NOTE — Patient Instructions (Signed)

## 2012-04-28 NOTE — Telephone Encounter (Signed)
Just fill with the new instructions That was probably the problem--they should approve once a day

## 2012-04-28 NOTE — Telephone Encounter (Signed)
Received form about pt's quantity of DOXAZOSIN and per pt she only takes 1/2 tablet twice daily, not 1 tablet twice daily. I will change in her chart, should we still send for quantity limit exception? Or just send in refill with new instructions? Please advise

## 2012-06-15 ENCOUNTER — Telehealth: Payer: Self-pay | Admitting: Internal Medicine

## 2012-06-15 MED ORDER — DOXAZOSIN MESYLATE 4 MG PO TABS: 2.0000 mg | ORAL_TABLET | Freq: Two times a day (BID) | ORAL | Status: DC

## 2012-06-15 NOTE — Telephone Encounter (Signed)
Beth, RPH is calling about RX of Doxazosin 4 mg tablets.  She has not received a new prescription for the medication and the instructions still read take 1 tablet (4mg ) twice a day.  Insurance will not pay for more than 1 tablet daily.  Pt states she is only taking 1/2 tablet BID.  RN viewed in EPIC that in March (04/28/12) there was discussion about the instructions and Dr Jyl Heinz did say pt was to take  1/2 tablet (2mg ) BID.  Pharmacy states they never received a new RX.  RN did not feel comfortable completing this without a dispense amount and refills on the RX.  OFFICE PLEASE FOLLOW UP WITH PHARMACY REGARDING RX, INSTRUCTIONS, ETC.

## 2012-06-15 NOTE — Telephone Encounter (Signed)
Sent new rx to pharmacy, 1/2 tablet twice daily

## 2012-07-18 ENCOUNTER — Other Ambulatory Visit: Payer: Self-pay | Admitting: Internal Medicine

## 2012-07-19 ENCOUNTER — Other Ambulatory Visit: Payer: Self-pay | Admitting: Internal Medicine

## 2012-07-19 NOTE — Telephone Encounter (Signed)
ok 

## 2012-07-19 NOTE — Telephone Encounter (Signed)
It looks like this was just done yesterday

## 2012-07-24 ENCOUNTER — Other Ambulatory Visit: Payer: Self-pay | Admitting: Internal Medicine

## 2012-08-01 ENCOUNTER — Encounter: Payer: Self-pay | Admitting: Internal Medicine

## 2012-08-01 ENCOUNTER — Ambulatory Visit (INDEPENDENT_AMBULATORY_CARE_PROVIDER_SITE_OTHER): Payer: PRIVATE HEALTH INSURANCE | Admitting: Internal Medicine

## 2012-08-01 VITALS — BP 122/70 | HR 100 | Temp 98.1°F | Wt 213.0 lb

## 2012-08-01 DIAGNOSIS — E1149 Type 2 diabetes mellitus with other diabetic neurological complication: Secondary | ICD-10-CM

## 2012-08-01 DIAGNOSIS — I10 Essential (primary) hypertension: Secondary | ICD-10-CM

## 2012-08-01 DIAGNOSIS — M1710 Unilateral primary osteoarthritis, unspecified knee: Secondary | ICD-10-CM

## 2012-08-01 NOTE — Assessment & Plan Note (Signed)
BP Readings from Last 3 Encounters:  08/01/12 122/70  04/28/12 138/80  02/22/12 150/80   Good control No changes

## 2012-08-01 NOTE — Progress Notes (Signed)
Subjective:    Patient ID: [name], female    DOB: 10/14/1940, 72 y.o.   MRN: [id number]  HPI Still having problems with left knee Still limiting her The cyst is not as obvious now though Gets pain "like a cramp" from upper thigh to knee---intermittent  Did go into the pool every day at the beach --for 2 weeks. Does ROM and some swimming  Has lost 3# more Trying to be careful with eating Not really checking sugars No hypoglycemic reactions  Current Outpatient Prescriptions on File Prior to Visit  Medication Sig Dispense Refill  . aspirin 81 MG tablet Take 81 mg by mouth daily.        . CELEBREX 200 MG capsule TAKE ONE CAPSULE BY MOUTH EVERY DAY  90 capsule  0  . doxazosin (CARDURA) 4 MG tablet Take 0.5 tablets (2 mg total) by mouth 2 (two) times daily.  90 tablet  3  . gabapentin (NEURONTIN) 100 MG capsule Take one by mouth twice a day  180 capsule  1  . glipiZIDE (GLUCOTROL XL) 5 MG 24 hr tablet Take 1 tablet (5 mg total) by mouth daily.  90 tablet  3  . losartan-hydrochlorothiazide (HYZAAR) 100-25 MG per tablet Take one by mouth daily  90 tablet  3  . metFORMIN (GLUCOPHAGE) 1000 MG tablet Take 1 tablet (1,000 mg total) by mouth 2 (two) times daily with a meal.  180 tablet  1  . omeprazole (PRILOSEC) 20 MG capsule TAKE ONE CAPSULE BY MOUTH TWICE DAILY FOR ACID REFLUX.  60 capsule  11  . Potassium 75 MG TABS Take 1 tablet by mouth daily.        Marland Kitchen tiZANidine (ZANAFLEX) 4 MG tablet TAKE ONE TABLET BY MOUTH AT BEDTIME AS NEEDED  30 tablet  0  . Venlafaxine HCl 75 MG TB24 Take 1 tablet (75 mg total) by mouth daily.  30 each  11   No current facility-administered medications on file prior to visit.    Allergies  Allergen Reactions  . Fluoxetine Hcl     REACTION: raised blood pressure  . Lisinopril     REACTION: dizzy  . Sertraline Hcl     REACTION: dizziness    Past Medical History  Diagnosis Date  . Asthma   . Diabetes mellitus     type 2  . Hypertension   .  Fibromyalgia   . Osteoarthritis   . Depression   . Anal fissure   . Arthritis   . Obesity   . GERD (gastroesophageal reflux disease)   . Allergic rhinitis     Past Surgical History  Procedure Laterality Date  . Cataract extraction, bilateral    . Cholecystectomy  4/06  . Abdominal hysterectomy  1970's  . Tubal ligation  1970's  . Ventral hernia repair  9/08    [place]    Family History  Problem Relation Age of Onset  . Heart attack Father   . Kidney cancer Mother   . Lung cancer Brother   . Liver cancer Brother   . Coronary artery disease Father   . Hypertension Neg Hx   . Diabetes Other     Maternal side  . Breast cancer Neg Hx   . Colon cancer Neg Hx     History   Social History  . Marital Status: Divorced    Spouse Name: N/A    Number of Children: 3  . Years of Education: N/A   Occupational History  .  Homemaker    Social History Main Topics  . Smoking status: Never Smoker   . Smokeless tobacco: Never Used  . Alcohol Use: No  . Drug Use: Not on file  . Sexually Active: Not on file   Other Topics Concern  . Not on file   Social History Narrative   No living will   No health care POA but would want son, Casimiro Needle, to make decisions about her care if needed   Would accept resuscitation attempts   Probably wouldn't want tube feeds if cognitively unaware   Review of Systems Never sleeps well--no change in 40 years---tizanidine does help Ex-husband still alive-- but "riddled with tumor"     Objective:   Physical Exam  Constitutional: She appears well-developed and well-nourished. No distress.  Neck: Normal range of motion. Neck supple. No thyromegaly present.  Cardiovascular: Normal rate, regular rhythm and normal heart sounds.  Exam reveals no gallop.   No murmur heard. Pulmonary/Chest: Effort normal and breath sounds normal. No respiratory distress. She has no wheezes. She has no rales.  Musculoskeletal: She exhibits no edema and no tenderness.   Lymphadenopathy:    She has no cervical adenopathy.  Psychiatric: She has a normal mood and affect. Her behavior is normal.          Assessment & Plan:

## 2012-08-01 NOTE — Assessment & Plan Note (Signed)
Still her physical limiting factor She is ready for another cortisone shot

## 2012-08-01 NOTE — Patient Instructions (Addendum)
Please set up an appt for a knee injection at your convenience

## 2012-08-01 NOTE — Assessment & Plan Note (Addendum)
Goal is under 8% Doesn't want insulin and can't afford new oral meds Prefers not to see an endocrinologist  Will have her continue to work on lifestyle measures and continued weight loss

## 2012-08-02 NOTE — Addendum Note (Signed)
Addended by: Alvina Chou on: 08/02/2012 10:20 AM   Modules accepted: Orders

## 2012-08-03 ENCOUNTER — Ambulatory Visit (INDEPENDENT_AMBULATORY_CARE_PROVIDER_SITE_OTHER): Payer: PRIVATE HEALTH INSURANCE | Admitting: Internal Medicine

## 2012-08-03 ENCOUNTER — Other Ambulatory Visit (INDEPENDENT_AMBULATORY_CARE_PROVIDER_SITE_OTHER): Payer: PRIVATE HEALTH INSURANCE

## 2012-08-03 ENCOUNTER — Encounter: Payer: Self-pay | Admitting: Internal Medicine

## 2012-08-03 VITALS — BP 120/70 | HR 105

## 2012-08-03 DIAGNOSIS — M1710 Unilateral primary osteoarthritis, unspecified knee: Secondary | ICD-10-CM

## 2012-08-03 LAB — BASIC METABOLIC PANEL
CO2: 24 mEq/L (ref 19–32)
Calcium: 9.3 mg/dL (ref 8.4–10.5)
Chloride: 98 mEq/L (ref 96–112)
Glucose, Bld: 185 mg/dL — ABNORMAL HIGH (ref 70–99)
Sodium: 132 mEq/L — ABNORMAL LOW (ref 135–145)

## 2012-08-03 LAB — LIPID PANEL
Cholesterol: 135 mg/dL (ref 0–200)
HDL: 40.6 mg/dL (ref >39.00)
VLDL: 35.2 mg/dL (ref 0.0–40.0)

## 2012-08-03 NOTE — Progress Notes (Signed)
  Subjective:    Patient ID: [name], female    DOB: [birth year], 72 y.o.   MRN: [id number]  HPI Here for knee injection   Review of Systems     Objective:   Physical Exam        Assessment & Plan:

## 2012-08-03 NOTE — Addendum Note (Signed)
Addended by: Josph Macho A on: 08/03/2012 12:12 PM   Modules accepted: Orders

## 2012-08-03 NOTE — Assessment & Plan Note (Signed)
PROCEDURE Sterile prep to medial left knee Ethyl chloride then 2cc plain 2% lido 40mg  depomedrol and 5cc 2% lido instilled without difficulty Discussed home care

## 2012-08-16 ENCOUNTER — Other Ambulatory Visit: Payer: Self-pay | Admitting: *Deleted

## 2012-08-16 NOTE — Telephone Encounter (Signed)
Last filled 07/18/12

## 2012-08-17 MED ORDER — TIZANIDINE HCL 4 MG PO TABS: ORAL_TABLET | ORAL | Status: DC

## 2012-08-17 NOTE — Telephone Encounter (Signed)
Okay #30 x 1 

## 2012-08-17 NOTE — Telephone Encounter (Signed)
rx sent to pharmacy by e-script  

## 2012-09-29 ENCOUNTER — Other Ambulatory Visit: Payer: Self-pay | Admitting: Internal Medicine

## 2012-10-16 ENCOUNTER — Other Ambulatory Visit: Payer: Self-pay | Admitting: Internal Medicine

## 2012-10-18 ENCOUNTER — Other Ambulatory Visit: Payer: Self-pay | Admitting: *Deleted

## 2012-10-18 MED ORDER — TIZANIDINE HCL 4 MG PO TABS: 4.0000 mg | ORAL_TABLET | Freq: Every evening | ORAL | Status: DC | PRN

## 2012-10-18 NOTE — Telephone Encounter (Signed)
rx sent to pharmacy by e-script  

## 2012-10-18 NOTE — Telephone Encounter (Signed)
Okay #30 x 1 

## 2012-10-25 ENCOUNTER — Other Ambulatory Visit: Payer: Self-pay | Admitting: *Deleted

## 2012-10-25 MED ORDER — CELECOXIB 200 MG PO CAPS: 200.0000 mg | ORAL_CAPSULE | Freq: Every day | ORAL | Status: DC

## 2012-11-01 ENCOUNTER — Other Ambulatory Visit: Payer: Self-pay | Admitting: *Deleted

## 2012-11-01 ENCOUNTER — Other Ambulatory Visit: Payer: Self-pay | Admitting: Internal Medicine

## 2012-11-01 MED ORDER — METFORMIN HCL 1000 MG PO TABS: ORAL_TABLET | ORAL | Status: DC

## 2012-11-02 ENCOUNTER — Other Ambulatory Visit: Payer: Self-pay

## 2012-11-02 MED ORDER — VENLAFAXINE HCL ER 75 MG PO TB24: 1.0000 | ORAL_TABLET | Freq: Every day | ORAL | Status: DC

## 2012-11-02 MED ORDER — CELECOXIB 200 MG PO CAPS: 200.0000 mg | ORAL_CAPSULE | Freq: Every day | ORAL | Status: DC

## 2012-11-02 MED ORDER — LOSARTAN POTASSIUM-HCTZ 100-25 MG PO TABS: ORAL_TABLET | ORAL | Status: DC

## 2012-11-02 NOTE — Telephone Encounter (Signed)
Pt left v/m; pt is in donut hole and request written rx for losartan-hctz, celebrex and venlafaxine; pt will send to [place]. Pt request to pick up rx today. Call pt when ready for pick up.

## 2012-11-02 NOTE — Telephone Encounter (Signed)
Left message on machine that rx is ready for pick-up, and it will be at our front desk.  

## 2012-12-09 ENCOUNTER — Ambulatory Visit (INDEPENDENT_AMBULATORY_CARE_PROVIDER_SITE_OTHER): Payer: PRIVATE HEALTH INSURANCE | Admitting: Internal Medicine

## 2012-12-09 ENCOUNTER — Encounter: Payer: Self-pay | Admitting: Internal Medicine

## 2012-12-09 VITALS — BP 138/90 | HR 80 | Temp 98.5°F | Ht 63.5 in | Wt 211.5 lb

## 2012-12-09 DIAGNOSIS — F39 Unspecified mood [affective] disorder: Secondary | ICD-10-CM

## 2012-12-09 DIAGNOSIS — Z0000 Encounter for general adult medical examination without abnormal findings: Secondary | ICD-10-CM

## 2012-12-09 DIAGNOSIS — I10 Essential (primary) hypertension: Secondary | ICD-10-CM

## 2012-12-09 DIAGNOSIS — D649 Anemia, unspecified: Secondary | ICD-10-CM

## 2012-12-09 DIAGNOSIS — E1142 Type 2 diabetes mellitus with diabetic polyneuropathy: Secondary | ICD-10-CM

## 2012-12-09 DIAGNOSIS — E1149 Type 2 diabetes mellitus with other diabetic neurological complication: Secondary | ICD-10-CM

## 2012-12-09 DIAGNOSIS — Z23 Encounter for immunization: Secondary | ICD-10-CM

## 2012-12-09 LAB — HEPATIC FUNCTION PANEL
ALT: 23 U/L (ref 0–35)
AST: 29 U/L (ref 0–37)
Albumin: 3.7 g/dL (ref 3.5–5.2)
Alkaline Phosphatase: 60 U/L (ref 39–117)
Bilirubin, Direct: 0.1 mg/dL (ref 0.0–0.3)
Total Protein: 8 g/dL (ref 6.0–8.3)

## 2012-12-09 LAB — CBC WITH DIFFERENTIAL/PLATELET
Basophils Absolute: 0 10*3/uL (ref 0.0–0.1)
Basophils Relative: 0.2 % (ref 0.0–3.0)
Eosinophils Absolute: 0.1 10*3/uL (ref 0.0–0.7)
HCT: 29.3 % — ABNORMAL LOW (ref 36.0–46.0)
Hemoglobin: 9.5 g/dL — ABNORMAL LOW (ref 12.0–15.0)
Lymphs Abs: 1.5 10*3/uL (ref 0.7–4.0)
MCHC: 32.5 g/dL (ref 30.0–36.0)
Monocytes Relative: 6.6 % (ref 3.0–12.0)
Neutro Abs: 5.1 10*3/uL (ref 1.4–7.7)
RBC: 3.57 Mil/uL — ABNORMAL LOW (ref 3.87–5.11)
RDW: 16.2 % — ABNORMAL HIGH (ref 11.5–14.6)
WBC: 7.2 10*3/uL (ref 4.5–10.5)

## 2012-12-09 LAB — BASIC METABOLIC PANEL
CO2: 26 mEq/L (ref 19–32)
Chloride: 100 mEq/L (ref 96–112)
GFR: 52.55 mL/min — ABNORMAL LOW (ref >60.00)
Glucose, Bld: 141 mg/dL — ABNORMAL HIGH (ref 70–99)
Potassium: 3.8 mEq/L (ref 3.5–5.1)
Sodium: 136 mEq/L (ref 135–145)

## 2012-12-09 LAB — T4, FREE: Free T4: 0.95 ng/dL (ref 0.60–1.60)

## 2012-12-09 LAB — HEMOGLOBIN A1C: Hgb A1c MFr Bld: 8.2 % — ABNORMAL HIGH (ref 4.6–6.5)

## 2012-12-09 NOTE — Assessment & Plan Note (Signed)
BP Readings from Last 3 Encounters:  12/09/12 138/90  08/03/12 120/70  08/01/12 122/70   Good control No changes

## 2012-12-09 NOTE — Assessment & Plan Note (Signed)
Hasn't been checking Discussed lifestyle

## 2012-12-09 NOTE — Assessment & Plan Note (Signed)
Chronic Will check labs

## 2012-12-09 NOTE — Addendum Note (Signed)
Addended by: Shon Millet on: 12/09/2012 11:31 AM   Modules accepted: Orders

## 2012-12-09 NOTE — Assessment & Plan Note (Signed)
She will set up mammo UTD otherwise

## 2012-12-09 NOTE — Progress Notes (Signed)
Subjective:    Patient ID: [name], female    DOB: [birth year], 72 y.o.   MRN: [id number]  HPI Here for physical Lots of stress--- lost husband and best friend recently Reviewed her wellness form  Inconsistent with fitness efforts Weight is down 2# Has had friend take her to beach--go to go out socially  Hasn't been checking her sugars Overdue for eye exam No sores in feet Persistent foot pain though--on gabapentin  Knee is still bad Worse with cold weather May get the TKR in 2015  Mood has been good Still on the medication  Current Outpatient Prescriptions on File Prior to Visit  Medication Sig Dispense Refill  . aspirin 81 MG tablet Take 81 mg by mouth daily.        . celecoxib (CELEBREX) 200 MG capsule Take 1 capsule (200 mg total) by mouth daily.  90 capsule  1  . doxazosin (CARDURA) 4 MG tablet Take 0.5 tablets (2 mg total) by mouth 2 (two) times daily.  90 tablet  3  . gabapentin (NEURONTIN) 100 MG capsule Take one by mouth twice a day  180 capsule  1  . glipiZIDE (GLUCOTROL XL) 5 MG 24 hr tablet TAKE ONE TABLET BY MOUTH EVERY DAY  90 tablet  1  . losartan-hydrochlorothiazide (HYZAAR) 100-25 MG per tablet Take one by mouth daily  90 tablet  3  . losartan-hydrochlorothiazide (HYZAAR) 100-25 MG per tablet TAKE ONE TABLET BY MOUTH EVERY DAY  90 tablet  1  . metFORMIN (GLUCOPHAGE) 1000 MG tablet TAKE ONE TABLET BY MOUTH TWICE DAILY WITH MEALS  60 tablet  3  . omeprazole (PRILOSEC) 20 MG capsule TAKE ONE CAPSULE BY MOUTH TWICE DAILY FOR ACID REFLUX.  60 capsule  11  . Potassium 75 MG TABS Take 1 tablet by mouth daily.        Marland Kitchen tiZANidine (ZANAFLEX) 4 MG tablet Take 1 tablet (4 mg total) by mouth at bedtime as needed.  30 tablet  1  . Venlafaxine HCl 75 MG TB24 Take 1 tablet (75 mg total) by mouth daily.  90 each  1   No current facility-administered medications on file prior to visit.    Allergies  Allergen Reactions  . Fluoxetine Hcl     REACTION: raised blood  pressure  . Lisinopril     REACTION: dizzy  . Sertraline Hcl     REACTION: dizziness    Past Medical History  Diagnosis Date  . Asthma   . Diabetes mellitus     type 2  . Hypertension   . Fibromyalgia   . Osteoarthritis   . Depression   . Anal fissure   . Arthritis   . Obesity   . GERD (gastroesophageal reflux disease)   . Allergic rhinitis     Past Surgical History  Procedure Laterality Date  . Cataract extraction, bilateral    . Cholecystectomy  4/06  . Abdominal hysterectomy  1970's  . Tubal ligation  1970's  . Ventral hernia repair  9/08    [place]    Family History  Problem Relation Age of Onset  . Heart attack Father   . Kidney cancer Mother   . Lung cancer Brother   . Liver cancer Brother   . Coronary artery disease Father   . Hypertension Neg Hx   . Diabetes Other     Maternal side  . Breast cancer Neg Hx   . Colon cancer Neg Hx     History  Social History  . Marital Status: Divorced    Spouse Name: N/A    Number of Children: 3  . Years of Education: N/A   Occupational History  . Homemaker    Social History Main Topics  . Smoking status: Never Smoker   . Smokeless tobacco: Never Used  . Alcohol Use: No  . Drug Use: No  . Sexual Activity: Not on file   Other Topics Concern  . Not on file   Social History Narrative   No living will   No health care POA but would want son, Casimiro Needle, to make decisions about her care if needed   Would accept resuscitation attempts   Probably wouldn't want tube feeds if cognitively unaware   Review of Systems  Constitutional: Negative for fatigue and unexpected weight change.       Wears seat belt  HENT: Negative for congestion, dental problem, hearing loss, rhinorrhea and tinnitus.        Full upper, partial lower--- overdue for dentist Tinnitus is gone  Eyes: Negative for visual disturbance.       No diplopia or unilateral vision loss  Respiratory: Negative for cough, chest tightness and shortness  of breath.   Cardiovascular: Negative for chest pain, palpitations and leg swelling.  Gastrointestinal: Positive for diarrhea. Negative for nausea, vomiting, abdominal pain, constipation and blood in stool.       2 loose stools every morning No incontinence No heartburn  Endocrine: Positive for heat intolerance. Negative for cold intolerance.       Sweats easy  Genitourinary: Negative for dysuria, hematuria and difficulty urinating.       No incontinence Has prolapse--has seen doctor at [facility]  Musculoskeletal: Positive for arthralgias. Negative for back pain and joint swelling.       Knees Knots on hands  Skin: Positive for rash.       Back breaks out--- scratches  Allergic/Immunologic: Negative for environmental allergies and immunocompromised state.  Neurological: Negative for dizziness, syncope, weakness, light-headedness, numbness and headaches.  Hematological: Negative for adenopathy. Does not bruise/bleed easily.  Psychiatric/Behavioral: Positive for sleep disturbance. Negative for dysphoric mood. The patient is not nervous/anxious.        Chronic sleep problems Controlled depression       Objective:   Physical Exam  Constitutional: She appears well-developed and well-nourished. No distress.  HENT:  Head: Normocephalic and atraumatic.  Right Ear: External ear normal.  Left Ear: External ear normal.  Mouth/Throat: Oropharynx is clear and moist. No oropharyngeal exudate.  Eyes: Conjunctivae are normal. Pupils are equal, round, and reactive to light.  Neck: Normal range of motion. Neck supple. No thyromegaly present.  Cardiovascular: Normal rate, regular rhythm, normal heart sounds and intact distal pulses.  Exam reveals no gallop.   No murmur heard. Pulmonary/Chest: Effort normal and breath sounds normal. No respiratory distress. She has no wheezes. She has no rales.  Abdominal: Soft. There is no tenderness.  Musculoskeletal: She exhibits no edema and no tenderness.   Lymphadenopathy:    She has no cervical adenopathy.    She has no axillary adenopathy.  Neurological:  Normal light touch sensation in feet  Skin: No rash noted. No erythema.  No foot lesions  Psychiatric: She has a normal mood and affect. Her behavior is normal.          Assessment & Plan:

## 2012-12-09 NOTE — Assessment & Plan Note (Signed)
Episodic depression or dysthymia Does okay with the med

## 2012-12-09 NOTE — Assessment & Plan Note (Signed)
Reasonable pain control with the gabapentin

## 2012-12-12 ENCOUNTER — Other Ambulatory Visit: Payer: Self-pay | Admitting: Internal Medicine

## 2012-12-16 ENCOUNTER — Other Ambulatory Visit: Payer: Self-pay | Admitting: *Deleted

## 2013-01-12 ENCOUNTER — Other Ambulatory Visit: Payer: Self-pay | Admitting: Internal Medicine

## 2013-01-13 NOTE — Telephone Encounter (Signed)
Okay #30 x 3 

## 2013-01-13 NOTE — Telephone Encounter (Signed)
Last OV 12/09/12, ok to refill?

## 2013-03-10 ENCOUNTER — Other Ambulatory Visit: Payer: Self-pay | Admitting: Internal Medicine

## 2013-03-27 ENCOUNTER — Other Ambulatory Visit: Payer: Self-pay | Admitting: *Deleted

## 2013-03-27 MED ORDER — DOXAZOSIN MESYLATE 4 MG PO TABS: 2.0000 mg | ORAL_TABLET | Freq: Two times a day (BID) | ORAL | Status: DC

## 2013-03-27 MED ORDER — GABAPENTIN 100 MG PO CAPS: 100.0000 mg | ORAL_CAPSULE | Freq: Two times a day (BID) | ORAL | Status: DC

## 2013-03-27 MED ORDER — GLIPIZIDE ER 5 MG PO TB24: 5.0000 mg | ORAL_TABLET | Freq: Every day | ORAL | Status: DC

## 2013-03-27 MED ORDER — OMEPRAZOLE 20 MG PO CPDR: 20.0000 mg | DELAYED_RELEASE_CAPSULE | Freq: Two times a day (BID) | ORAL | Status: DC

## 2013-03-27 MED ORDER — LOSARTAN POTASSIUM-HCTZ 100-25 MG PO TABS: 1.0000 | ORAL_TABLET | Freq: Every day | ORAL | Status: DC

## 2013-03-27 MED ORDER — TIZANIDINE HCL 4 MG PO TABS: 4.0000 mg | ORAL_TABLET | Freq: Every day | ORAL | Status: DC

## 2013-03-27 MED ORDER — METFORMIN HCL 1000 MG PO TABS: 1000.0000 mg | ORAL_TABLET | Freq: Two times a day (BID) | ORAL | Status: DC

## 2013-04-21 ENCOUNTER — Other Ambulatory Visit: Payer: Self-pay | Admitting: *Deleted

## 2013-04-21 NOTE — Telephone Encounter (Signed)
Received a fax Request from [place] on Celebrex. There was no pharmacy address on the request, so I called pt to verify the validity of the request. She stated that it was through a [place] called [facility]. She didn't have any additional information on the company. Per the [employer], it is an [facility] located in [place]. I printed the contact info and it says they have all rx's filled by the  [employer] ( licensed by the [facility], license # [id number]. Can you refill this rx for that pharmacy, or do we only do rx's filled and sent from a pharmacy in the [place]? Fax request, and pharmacy contact info will be in your inbox if you need it.

## 2013-04-22 NOTE — Telephone Encounter (Signed)
This needs to be addressed by the PCP

## 2013-04-24 MED ORDER — CELECOXIB 200 MG PO CAPS: ORAL_CAPSULE | ORAL | Status: DC

## 2013-04-24 NOTE — Telephone Encounter (Signed)
I have printed prescription I cannot send to [place] She is welcome to send it whereever

## 2013-04-25 NOTE — Telephone Encounter (Signed)
Left message on machine that rx ready for pick-up 

## 2013-04-26 ENCOUNTER — Telehealth: Payer: Self-pay | Admitting: *Deleted

## 2013-04-26 NOTE — Telephone Encounter (Signed)
Left message on machine about form we received from a company asking for compounding medicine, per Dr. Silvio Pate this is just a scam and will not sign the form.

## 2013-06-02 LAB — HM DIABETES EYE EXAM

## 2013-06-08 ENCOUNTER — Encounter: Payer: Self-pay | Admitting: Internal Medicine

## 2013-06-08 ENCOUNTER — Ambulatory Visit (INDEPENDENT_AMBULATORY_CARE_PROVIDER_SITE_OTHER): Payer: Commercial Managed Care - HMO | Admitting: Internal Medicine

## 2013-06-08 VITALS — BP 140/80 | HR 110 | Temp 98.4°F | Wt 214.0 lb

## 2013-06-08 DIAGNOSIS — M1710 Unilateral primary osteoarthritis, unspecified knee: Secondary | ICD-10-CM

## 2013-06-08 DIAGNOSIS — IMO0002 Reserved for concepts with insufficient information to code with codable children: Secondary | ICD-10-CM

## 2013-06-08 DIAGNOSIS — D649 Anemia, unspecified: Secondary | ICD-10-CM

## 2013-06-08 DIAGNOSIS — I10 Essential (primary) hypertension: Secondary | ICD-10-CM

## 2013-06-08 DIAGNOSIS — E1142 Type 2 diabetes mellitus with diabetic polyneuropathy: Secondary | ICD-10-CM

## 2013-06-08 DIAGNOSIS — E1149 Type 2 diabetes mellitus with other diabetic neurological complication: Secondary | ICD-10-CM

## 2013-06-08 DIAGNOSIS — M179 Osteoarthritis of knee, unspecified: Secondary | ICD-10-CM

## 2013-06-08 DIAGNOSIS — F39 Unspecified mood [affective] disorder: Secondary | ICD-10-CM

## 2013-06-08 LAB — BASIC METABOLIC PANEL
BUN: 23 mg/dL (ref 6–23)
CO2: 26 mEq/L (ref 19–32)
CREATININE: 1.1 mg/dL (ref 0.4–1.2)
Calcium: 9.1 mg/dL (ref 8.4–10.5)
Chloride: 100 mEq/L (ref 96–112)
GFR: 51.39 mL/min — ABNORMAL LOW (ref >60.00)
Glucose, Bld: 195 mg/dL — ABNORMAL HIGH (ref 70–99)
POTASSIUM: 4.3 meq/L (ref 3.5–5.1)
Sodium: 135 mEq/L (ref 135–145)

## 2013-06-08 LAB — HEMOGLOBIN A1C: HEMOGLOBIN A1C: 8.9 % — AB (ref 4.6–6.5)

## 2013-06-08 MED ORDER — CELECOXIB 200 MG PO CAPS: ORAL_CAPSULE | ORAL | Status: DC

## 2013-06-08 MED ORDER — VENLAFAXINE HCL ER 75 MG PO TB24: 1.0000 | ORAL_TABLET | Freq: Every day | ORAL | Status: DC

## 2013-06-08 NOTE — Assessment & Plan Note (Signed)
Discussed lifestyle issues Will check A1c

## 2013-06-08 NOTE — Assessment & Plan Note (Signed)
BP Readings from Last 3 Encounters:  06/08/13 140/80  12/09/12 138/90  08/03/12 120/70   Good control Will check renal since on celebrex

## 2013-06-08 NOTE — Assessment & Plan Note (Signed)
Moderate but stable

## 2013-06-08 NOTE — Progress Notes (Signed)
Pre visit review using our clinic review tool, if applicable. No additional management support is needed unless otherwise documented below in the visit note. 

## 2013-06-08 NOTE — Progress Notes (Signed)
Subjective:    Patient ID: [name], female    DOB: 19-Aug-1940, 73 y.o.   MRN: [id number]  HPI Here for follow up Doing okay in general  Ongoing left knee pain Now with back pain which goes down the left leg Noted after straining with stool about 2 weeks ago Staying on couch with heating pad Has tried some tylenol--not much help celebrex every day--trouble with the cost though Feels ready for another knee injection  Mood is okay on the venlafaxine This has also gotten expensive  Not checking sugars No low sugar reactions  No chest pain No SOB No dizziness or syncope  Current Outpatient Prescriptions on File Prior to Visit  Medication Sig Dispense Refill  . aspirin 81 MG tablet Take 81 mg by mouth daily.        . celecoxib (CELEBREX) 200 MG capsule TAKE ONE CAPSULE BY MOUTH EVERY DAY  90 capsule  3  . doxazosin (CARDURA) 4 MG tablet Take 0.5 tablets (2 mg total) by mouth 2 (two) times daily.  90 tablet  3  . gabapentin (NEURONTIN) 100 MG capsule Take 1 capsule (100 mg total) by mouth 2 (two) times daily.  180 capsule  3  . glipiZIDE (GLUCOTROL XL) 5 MG 24 hr tablet Take 1 tablet (5 mg total) by mouth daily with breakfast.  90 tablet  3  . losartan-hydrochlorothiazide (HYZAAR) 100-25 MG per tablet Take 1 tablet by mouth daily.  90 tablet  3  . metFORMIN (GLUCOPHAGE) 1000 MG tablet Take 1 tablet (1,000 mg total) by mouth 2 (two) times daily with a meal.  180 tablet  3  . omeprazole (PRILOSEC) 20 MG capsule Take 1 capsule (20 mg total) by mouth 2 (two) times daily before a meal.  180 capsule  3  . Potassium 75 MG TABS Take 1 tablet by mouth daily.        Marland Kitchen tiZANidine (ZANAFLEX) 4 MG tablet Take 1 tablet (4 mg total) by mouth at bedtime.  90 tablet  3  . Venlafaxine HCl 75 MG TB24 Take 1 tablet (75 mg total) by mouth daily.  90 each  1   No current facility-administered medications on file prior to visit.    Allergies  Allergen Reactions  . Fluoxetine Hcl    REACTION: raised blood pressure  . Lisinopril     REACTION: dizzy  . Sertraline Hcl     REACTION: dizziness    Past Medical History  Diagnosis Date  . Asthma   . Diabetes mellitus     type 2  . Hypertension   . Fibromyalgia   . Osteoarthritis   . Depression   . Anal fissure   . Arthritis   . Obesity   . GERD (gastroesophageal reflux disease)   . Allergic rhinitis     Past Surgical History  Procedure Laterality Date  . Cataract extraction, bilateral    . Cholecystectomy  4/06  . Abdominal hysterectomy  1970's  . Tubal ligation  1970's  . Ventral hernia repair  9/08    [facility]    Family History  Problem Relation Age of Onset  . Heart attack Father   . Kidney cancer Mother   . Lung cancer Brother   . Liver cancer Brother   . Coronary artery disease Father   . Hypertension Neg Hx   . Diabetes Other     Maternal side  . Breast cancer Neg Hx   . Colon cancer Neg Hx  History   Social History  . Marital Status: Divorced    Spouse Name: N/A    Number of Children: 3  . Years of Education: N/A   Occupational History  . Homemaker    Social History Main Topics  . Smoking status: Never Smoker   . Smokeless tobacco: Never Used  . Alcohol Use: No  . Drug Use: No  . Sexual Activity: Not on file   Other Topics Concern  . Not on file   Social History Narrative   No living will   No health care POA but would want son, [name], to make decisions about her care if needed   Would accept resuscitation attempts   Probably wouldn't want tube feeds if cognitively unaware   Review of Systems Had detached retina on right---needed emergency surgery (billed [phone])    Objective:   Physical Exam  Constitutional: She appears well-developed and well-nourished. No distress.  Neck: Normal range of motion. Neck supple. No thyromegaly present.  Cardiovascular: Normal rate, regular rhythm, normal heart sounds and intact distal pulses.  Exam reveals no gallop.   No murmur  heard. Pulmonary/Chest: Effort normal and breath sounds normal. No respiratory distress. She has no wheezes. She has no rales.  Abdominal: Soft. There is no tenderness.  Musculoskeletal:  No pain with passive ROM of hips SLR negative  Lymphadenopathy:    She has no cervical adenopathy.  Neurological:  No leg weakness Mild decreased sensation in feet  Psychiatric: She has a normal mood and affect. Her behavior is normal.          Assessment & Plan:

## 2013-06-08 NOTE — Assessment & Plan Note (Signed)
Does okay with the venlafaxine

## 2013-06-08 NOTE — Assessment & Plan Note (Signed)
PROCEDURE Sterile prep to medial left knee Ethyl chloride then 2cc plain 2% lido 40mg  depomedrol and 5cc 2% lido instilled Tolerated well Discussed home care

## 2013-06-08 NOTE — Assessment & Plan Note (Signed)
Mild No Rx needed 

## 2013-06-09 ENCOUNTER — Telehealth: Payer: Self-pay | Admitting: Internal Medicine

## 2013-06-09 NOTE — Telephone Encounter (Signed)
Relevant patient education assigned to patient using Emmi. ° °

## 2013-06-14 ENCOUNTER — Encounter: Payer: Self-pay | Admitting: Internal Medicine

## 2013-06-14 ENCOUNTER — Telehealth: Payer: Self-pay

## 2013-06-14 ENCOUNTER — Ambulatory Visit (INDEPENDENT_AMBULATORY_CARE_PROVIDER_SITE_OTHER): Payer: Commercial Managed Care - HMO | Admitting: Internal Medicine

## 2013-06-14 VITALS — BP 120/60 | HR 118 | Temp 98.4°F | Wt 210.0 lb

## 2013-06-14 DIAGNOSIS — M7120 Synovial cyst of popliteal space [Baker], unspecified knee: Secondary | ICD-10-CM

## 2013-06-14 MED ORDER — HYDROCODONE-ACETAMINOPHEN 5-325 MG PO TABS: 1.0000 | ORAL_TABLET | Freq: Four times a day (QID) | ORAL | Status: DC | PRN

## 2013-06-14 NOTE — Progress Notes (Signed)
Subjective:    Patient ID: [name], female    DOB: 1940-07-12, 73 y.o.   MRN: [id number]  HPI Had cortisone shot 7 days ago Didn't really improve at all but no worse  Then 3 days ago--noted pain that awoke her. Like a toothache Kept her up mostly 2 days ago--could barely walk without holding onto something Some improvement yesterday afternoon (after keeping it elevated)  No swelling Aches and sore Not red  Heat helped a little Ice no help Walking with cane  Current Outpatient Prescriptions on File Prior to Visit  Medication Sig Dispense Refill  . aspirin 81 MG tablet Take 81 mg by mouth daily.        . celecoxib (CELEBREX) 200 MG capsule TAKE ONE CAPSULE BY MOUTH EVERY DAY  90 capsule  3  . doxazosin (CARDURA) 4 MG tablet Take 0.5 tablets (2 mg total) by mouth 2 (two) times daily.  90 tablet  3  . gabapentin (NEURONTIN) 100 MG capsule Take 1 capsule (100 mg total) by mouth 2 (two) times daily.  180 capsule  3  . glipiZIDE (GLUCOTROL XL) 5 MG 24 hr tablet Take 1 tablet (5 mg total) by mouth daily with breakfast.  90 tablet  3  . losartan-hydrochlorothiazide (HYZAAR) 100-25 MG per tablet Take 1 tablet by mouth daily.  90 tablet  3  . metFORMIN (GLUCOPHAGE) 1000 MG tablet Take 1 tablet (1,000 mg total) by mouth 2 (two) times daily with a meal.  180 tablet  3  . omeprazole (PRILOSEC) 20 MG capsule Take 1 capsule (20 mg total) by mouth 2 (two) times daily before a meal.  180 capsule  3  . Potassium 75 MG TABS Take 1 tablet by mouth daily.        Marland Kitchen tiZANidine (ZANAFLEX) 4 MG tablet Take 1 tablet (4 mg total) by mouth at bedtime.  90 tablet  3  . Venlafaxine HCl 75 MG TB24 Take 1 tablet (75 mg total) by mouth daily.  90 each  3   No current facility-administered medications on file prior to visit.    Allergies  Allergen Reactions  . Fluoxetine Hcl     REACTION: raised blood pressure  . Lisinopril     REACTION: dizzy  . Sertraline Hcl     REACTION: dizziness    Past  Medical History  Diagnosis Date  . Asthma   . Diabetes mellitus     type 2  . Hypertension   . Fibromyalgia   . Osteoarthritis   . Depression   . Anal fissure   . Arthritis   . Obesity   . GERD (gastroesophageal reflux disease)   . Allergic rhinitis     Past Surgical History  Procedure Laterality Date  . Cataract extraction, bilateral    . Cholecystectomy  4/06  . Abdominal hysterectomy  1970's  . Tubal ligation  1970's  . Ventral hernia repair  9/08    [facility]    Family History  Problem Relation Age of Onset  . Heart attack Father   . Kidney cancer Mother   . Lung cancer Brother   . Liver cancer Brother   . Coronary artery disease Father   . Hypertension Neg Hx   . Diabetes Other     Maternal side  . Breast cancer Neg Hx   . Colon cancer Neg Hx     History   Social History  . Marital Status: Divorced    Spouse Name: N/A  Number of Children: 3  . Years of Education: N/A   Occupational History  . Homemaker    Social History Main Topics  . Smoking status: Never Smoker   . Smokeless tobacco: Never Used  . Alcohol Use: No  . Drug Use: No  . Sexual Activity: Not on file   Other Topics Concern  . Not on file   Social History Narrative   No living will   No health care POA but would want son, [name], to make decisions about her care if needed   Would accept resuscitation attempts   Probably wouldn't want tube feeds if cognitively unaware   Review of Systems No fever Does feel hot and has headache Some nausea     Objective:   Physical Exam  Constitutional: She appears well-developed and well-nourished. No distress.  Musculoskeletal:  Small Baker's cyst on left--mildly tender No effusion No redness or warmth          Assessment & Plan:

## 2013-06-14 NOTE — Telephone Encounter (Signed)
Jeanine with [facility] said pt had cortisone injection in knee on 06/08/13; on 06/11/13 lt knee pain became severe with pain radiating down leg; now pt is using cane for weight bearing. No redness or swelling of leg, no CP or SOB. Dr Silvio Pate will see pt today around 12:45 PM. Pt will cb if condition changes or worsens prior to appt. Ashby Dawes will let pt know.

## 2013-06-14 NOTE — Progress Notes (Signed)
Pre visit review using our clinic review tool, if applicable. No additional management support is needed unless otherwise documented below in the visit note. 

## 2013-06-14 NOTE — Assessment & Plan Note (Addendum)
No evidence of post procedure infection Not sure if her pain is related to meds getting in the cyst (not sure why it would be delayed) Pain into leg but no signs of DVT, etc  Will try hydrocodone Consider PT eval if worsens or isn't improving

## 2013-06-14 NOTE — Telephone Encounter (Signed)
Will evaluate at [facility] 

## 2013-06-14 NOTE — Telephone Encounter (Signed)
Patient Information:  Caller Name: Vonda  Phone: [phone]  Patient: [name], [name]  Gender: Female  DOB: 1940/03/30  Age: 73 Years  PCP: [name] [facility]  Office Follow Up:  Does the office need to follow up with this patient?: No  Instructions For The Office: N/A  RN Note:  Spoke to Rena,RN in the office and she conferred with Dr. Silvio Pate and he will see her in the office at 12:45-06/14/13.  Symptoms  Reason For Call & Symptoms: Calling about given Cortisone shot in the L knee on 06/08/13 and started with severe pain behind knee on 06/11/13 and trouble sleeping overnight. Knee is achiing and pain radiates down the lower leg front and back. No redness, swelling, or warmth. Ankle is sore with weight bearing and having to use cane. She stayed in bed all day yesterday 06/13/13. Afebrile. She is using heating pad to leg and helps some. Hurts to elevate leg. She has Baker's Cyst behind knee.  Reviewed Health History In EMR: Yes  Reviewed Medications In EMR: Yes  Reviewed Allergies In EMR: Yes  Reviewed Surgeries / Procedures: Yes  Date of Onset of Symptoms: 06/11/2013  Treatments Tried: Tylenol 250 mgs 2 tabs PO prn, Heating pad  Treatments Tried Worked: No  Guideline(s) Used:  Knee Pain  Disposition Per Guideline:   Go to ED Now (or to Office with PCP Approval)  Reason For Disposition Reached:   Thigh or calf pain and only 1 side and present > 1 hour  Advice Given:  Rest Your Knee   for the next couple days. Avoid activities that worsen your pain. Reduce activities that put a lot of strain on the knee joint (e.g., deep knee bends, stair climbing, running).  Pain Medicines:  For pain relief, you can take either acetaminophen, ibuprofen, or naproxen.  Acetaminophen (e.g., Tylenol):  Regular Strength Tylenol: Take 650 mg (two 325 mg pills) by mouth every 4-6 hours as needed. Each Regular Strength Tylenol pill has 325 mg of acetaminophen.  Extra Strength Tylenol: Take  1,000 mg (two 500 mg pills) every 8 hours as needed. Each Extra Strength Tylenol pill has 500 mg of acetaminophen.  The most you should take each day is 3,000 mg (10 Regular Strength or 6 Extra Strength pills a day).  Expected Course:   If your knee pain does not get better during the next week or if it recurs, then you should make an appointment with your doctor.  Call Back If:  Knee pain lasts longer than 7 days  You become worse.  Patient Will Follow Care Advice:  YES  Appointment Scheduled:  06/14/2013 12:45:00 Appointment Scheduled Provider:  [name] [facility])

## 2013-06-20 ENCOUNTER — Telehealth: Payer: Self-pay

## 2013-06-20 NOTE — Telephone Encounter (Signed)
Relevant patient education assigned to patient using Emmi. ° °

## 2013-06-28 ENCOUNTER — Emergency Department: Payer: Self-pay | Admitting: Emergency Medicine

## 2013-09-13 ENCOUNTER — Encounter: Payer: Self-pay | Admitting: Gastroenterology

## 2013-11-24 ENCOUNTER — Other Ambulatory Visit: Payer: Self-pay

## 2013-12-05 ENCOUNTER — Encounter: Payer: Self-pay | Admitting: Internal Medicine

## 2013-12-05 ENCOUNTER — Ambulatory Visit (INDEPENDENT_AMBULATORY_CARE_PROVIDER_SITE_OTHER): Payer: Commercial Managed Care - HMO | Admitting: Internal Medicine

## 2013-12-05 VITALS — BP 142/70 | HR 99 | Temp 97.5°F | Wt 217.0 lb

## 2013-12-05 DIAGNOSIS — E1140 Type 2 diabetes mellitus with diabetic neuropathy, unspecified: Secondary | ICD-10-CM

## 2013-12-05 DIAGNOSIS — Z23 Encounter for immunization: Secondary | ICD-10-CM

## 2013-12-05 DIAGNOSIS — E1142 Type 2 diabetes mellitus with diabetic polyneuropathy: Secondary | ICD-10-CM

## 2013-12-05 DIAGNOSIS — IMO0002 Reserved for concepts with insufficient information to code with codable children: Secondary | ICD-10-CM

## 2013-12-05 DIAGNOSIS — I10 Essential (primary) hypertension: Secondary | ICD-10-CM

## 2013-12-05 DIAGNOSIS — E1165 Type 2 diabetes mellitus with hyperglycemia: Secondary | ICD-10-CM

## 2013-12-05 DIAGNOSIS — M797 Fibromyalgia: Secondary | ICD-10-CM

## 2013-12-05 DIAGNOSIS — F39 Unspecified mood [affective] disorder: Secondary | ICD-10-CM

## 2013-12-05 LAB — CBC WITH DIFFERENTIAL/PLATELET
BASOS ABS: 0 10*3/uL (ref 0.0–0.1)
Basophils Relative: 0.5 % (ref 0.0–3.0)
EOS ABS: 0.1 10*3/uL (ref 0.0–0.7)
Eosinophils Relative: 1.9 % (ref 0.0–5.0)
HCT: 27 % — ABNORMAL LOW (ref 36.0–46.0)
Hemoglobin: 8.4 g/dL — ABNORMAL LOW (ref 12.0–15.0)
Lymphocytes Relative: 18.4 % (ref 12.0–46.0)
Lymphs Abs: 1.4 10*3/uL (ref 0.7–4.0)
MCHC: 31 g/dL (ref 30.0–36.0)
MCV: 79.6 fl (ref 78.0–100.0)
MONO ABS: 0.5 10*3/uL (ref 0.1–1.0)
Monocytes Relative: 6.2 % (ref 3.0–12.0)
NEUTROS PCT: 73 % (ref 43.0–77.0)
Neutro Abs: 5.7 10*3/uL (ref 1.4–7.7)
PLATELETS: 236 10*3/uL (ref 150.0–400.0)
RBC: 3.39 Mil/uL — ABNORMAL LOW (ref 3.87–5.11)
RDW: 16.8 % — AB (ref 11.5–15.5)
WBC: 7.8 10*3/uL (ref 4.0–10.5)

## 2013-12-05 LAB — COMPREHENSIVE METABOLIC PANEL
ALK PHOS: 64 U/L (ref 39–117)
ALT: 21 U/L (ref 0–35)
AST: 31 U/L (ref 0–37)
Albumin: 3.2 g/dL — ABNORMAL LOW (ref 3.5–5.2)
BUN: 21 mg/dL (ref 6–23)
CO2: 25 mEq/L (ref 19–32)
Calcium: 9.1 mg/dL (ref 8.4–10.5)
Chloride: 100 mEq/L (ref 96–112)
Creatinine, Ser: 1.1 mg/dL (ref 0.4–1.2)
GFR: 51.85 mL/min — ABNORMAL LOW (ref >60.00)
GLUCOSE: 245 mg/dL — AB (ref 70–99)
Potassium: 4.4 mEq/L (ref 3.5–5.1)
Sodium: 134 mEq/L — ABNORMAL LOW (ref 135–145)
TOTAL PROTEIN: 7.7 g/dL (ref 6.0–8.3)
Total Bilirubin: 0.3 mg/dL (ref 0.2–1.2)

## 2013-12-05 LAB — LIPID PANEL
CHOLESTEROL: 122 mg/dL (ref 0–200)
HDL: 37.7 mg/dL — AB (ref >39.00)
LDL Cholesterol: 62 mg/dL (ref 0–99)
NonHDL: 84.3
Total CHOL/HDL Ratio: 3
Triglycerides: 111 mg/dL (ref 0.0–149.0)
VLDL: 22.2 mg/dL (ref 0.0–40.0)

## 2013-12-05 LAB — T4, FREE: FREE T4: 1.05 ng/dL (ref 0.60–1.60)

## 2013-12-05 LAB — HEMOGLOBIN A1C: Hgb A1c MFr Bld: 9.3 % — ABNORMAL HIGH (ref 4.6–6.5)

## 2013-12-05 NOTE — Progress Notes (Signed)
Subjective:    Patient ID: [name], female    DOB: 1940/08/10, 73 y.o.   MRN: [id number]  HPI Here for follow up "I've had a hard 6 months" [place] and hurt herself in son's yard---bad hematoma, black eyes Eyelid surgery  Not checking sugars Not excited but would consider endocrinologist No hypoglycemic reactions No exercise  Lots of soreness in joints "everything" hurts---especially arms, etc Thinks it is the fibromyalagia  Mood has been okay No major depression spells despite the recent challenges Satisfied with the medication at this point  Current Outpatient Prescriptions on File Prior to Visit  Medication Sig Dispense Refill  . aspirin 81 MG tablet Take 81 mg by mouth daily.        . celecoxib (CELEBREX) 200 MG capsule TAKE ONE CAPSULE BY MOUTH EVERY DAY  90 capsule  3  . doxazosin (CARDURA) 4 MG tablet Take 0.5 tablets (2 mg total) by mouth 2 (two) times daily.  90 tablet  3  . gabapentin (NEURONTIN) 100 MG capsule Take 1 capsule (100 mg total) by mouth 2 (two) times daily.  180 capsule  3  . glipiZIDE (GLUCOTROL XL) 5 MG 24 hr tablet Take 1 tablet (5 mg total) by mouth daily with breakfast.  90 tablet  3  . HYDROcodone-acetaminophen (NORCO/VICODIN) 5-325 MG per tablet Take 1 tablet by mouth every 6 (six) hours as needed for moderate pain.  30 tablet  0  . losartan-hydrochlorothiazide (HYZAAR) 100-25 MG per tablet Take 1 tablet by mouth daily.  90 tablet  3  . metFORMIN (GLUCOPHAGE) 1000 MG tablet Take 1 tablet (1,000 mg total) by mouth 2 (two) times daily with a meal.  180 tablet  3  . omeprazole (PRILOSEC) 20 MG capsule Take 1 capsule (20 mg total) by mouth 2 (two) times daily before a meal.  180 capsule  3  . Potassium 75 MG TABS Take 1 tablet by mouth daily.        Marland Kitchen tiZANidine (ZANAFLEX) 4 MG tablet Take 1 tablet (4 mg total) by mouth at bedtime.  90 tablet  3  . Venlafaxine HCl 75 MG TB24 Take 1 tablet (75 mg total) by mouth daily.  90 each  3   No current  facility-administered medications on file prior to visit.    Allergies  Allergen Reactions  . Fluoxetine Hcl     REACTION: raised blood pressure  . Lisinopril     REACTION: dizzy  . Sertraline Hcl     REACTION: dizziness    Past Medical History  Diagnosis Date  . Asthma   . Diabetes mellitus     type 2  . Hypertension   . Fibromyalgia   . Osteoarthritis   . Depression   . Anal fissure   . Arthritis   . Obesity   . GERD (gastroesophageal reflux disease)   . Allergic rhinitis     Past Surgical History  Procedure Laterality Date  . Cataract extraction, bilateral    . Cholecystectomy  4/06  . Abdominal hysterectomy  1970's  . Tubal ligation  1970's  . Ventral hernia repair  9/08    [facility]    Family History  Problem Relation Age of Onset  . Heart attack Father   . Kidney cancer Mother   . Lung cancer Brother   . Liver cancer Brother   . Coronary artery disease Father   . Hypertension Neg Hx   . Diabetes Other     Maternal side  .  Breast cancer Neg Hx   . Colon cancer Neg Hx     History   Social History  . Marital Status: Divorced    Spouse Name: N/A    Number of Children: 3  . Years of Education: N/A   Occupational History  . Homemaker    Social History Main Topics  . Smoking status: Never Smoker   . Smokeless tobacco: Never Used  . Alcohol Use: No  . Drug Use: No  . Sexual Activity: Not on file   Other Topics Concern  . Not on file   Social History Narrative   No living will   No health care POA but would want son, [name], to make decisions about her care if needed   Would accept resuscitation attempts   Probably wouldn't want tube feeds if cognitively unaware   Review of Systems Not sleeping well--has never slept well Weight is up 7# from last visit     Objective:   Physical Exam  Constitutional: She appears well-developed and well-nourished. No distress.  Neck: Normal range of motion. Neck supple. No thyromegaly present.    Cardiovascular: Normal rate, regular rhythm, normal heart sounds and intact distal pulses.  Exam reveals no gallop.   No murmur heard. Pulmonary/Chest: Effort normal and breath sounds normal. No respiratory distress. She has no wheezes. She has no rales.  Musculoskeletal: She exhibits no edema and no tenderness.  Lymphadenopathy:    She has no cervical adenopathy.  Neurological:  Normal sensation in feet  Skin: No rash noted.  No foot lesions  Psychiatric: She has a normal mood and affect. Her behavior is normal.          Assessment & Plan:

## 2013-12-05 NOTE — Addendum Note (Signed)
Addended by: Despina Hidden on: 12/05/2013 03:04 PM   Modules accepted: Orders

## 2013-12-05 NOTE — Progress Notes (Signed)
Pre visit review using our clinic review tool, if applicable. No additional management support is needed unless otherwise documented below in the visit note. 

## 2013-12-05 NOTE — Assessment & Plan Note (Signed)
BP Readings from Last 3 Encounters:  12/05/13 142/70  06/14/13 120/60  06/08/13 140/80   Reasonable control No changes

## 2013-12-05 NOTE — Assessment & Plan Note (Signed)
Discussed need for regular low level physical exercise

## 2013-12-05 NOTE — Assessment & Plan Note (Signed)
Lots of challenges but mood okay Continue current Rx

## 2013-12-05 NOTE — Assessment & Plan Note (Signed)
Some pain No med changes needed

## 2013-12-05 NOTE — Assessment & Plan Note (Signed)
If not better, will refer to Dr Howell Rucks

## 2013-12-08 ENCOUNTER — Ambulatory Visit: Payer: Commercial Managed Care - HMO | Admitting: Internal Medicine

## 2013-12-15 ENCOUNTER — Encounter: Payer: Self-pay | Admitting: *Deleted

## 2013-12-25 ENCOUNTER — Encounter: Payer: Self-pay | Admitting: Internal Medicine

## 2013-12-25 ENCOUNTER — Ambulatory Visit (INDEPENDENT_AMBULATORY_CARE_PROVIDER_SITE_OTHER): Payer: Commercial Managed Care - HMO | Admitting: Internal Medicine

## 2013-12-25 VITALS — BP 128/80 | HR 104 | Temp 98.1°F | Wt 215.0 lb

## 2013-12-25 DIAGNOSIS — H00033 Abscess of eyelid right eye, unspecified eyelid: Secondary | ICD-10-CM | POA: Insufficient documentation

## 2013-12-25 MED ORDER — HYDROCODONE-HOMATROPINE 5-1.5 MG/5ML PO SYRP: 5.0000 mL | ORAL_SOLUTION | Freq: Every evening | ORAL | Status: DC | PRN

## 2013-12-25 MED ORDER — CEPHALEXIN 500 MG PO CAPS: 500.0000 mg | ORAL_CAPSULE | Freq: Four times a day (QID) | ORAL | Status: DC

## 2013-12-25 NOTE — Progress Notes (Signed)
Pre visit review using our clinic review tool, if applicable. No additional management support is needed unless otherwise documented below in the visit note. 

## 2013-12-25 NOTE — Patient Instructions (Signed)
If your eye is not considerably better within 2 days, you should go back to the eye doctor.

## 2013-12-25 NOTE — Progress Notes (Signed)
Subjective:    Patient ID: [name], female    DOB: [birth year], 73 y.o.   MRN: [id number]  HPI Awoke yesterday with red swollen right eye and eyelid Noticed pain while lying on that side Yellow discharge  Face feels swollen on that side Has had fever, chills, sore throat, feels wheezy Has nasal congestion also  Had the eye surgery 2 months ago Had knot lateral to eye--that is where the drainage has been from  Current Outpatient Prescriptions on File Prior to Visit  Medication Sig Dispense Refill  . aspirin 81 MG tablet Take 81 mg by mouth daily.      . celecoxib (CELEBREX) 200 MG capsule TAKE ONE CAPSULE BY MOUTH EVERY DAY 90 capsule 3  . doxazosin (CARDURA) 4 MG tablet Take 0.5 tablets (2 mg total) by mouth 2 (two) times daily. 90 tablet 3  . gabapentin (NEURONTIN) 100 MG capsule Take 1 capsule (100 mg total) by mouth 2 (two) times daily. 180 capsule 3  . glipiZIDE (GLUCOTROL XL) 5 MG 24 hr tablet Take 1 tablet (5 mg total) by mouth daily with breakfast. 90 tablet 3  . HYDROcodone-acetaminophen (NORCO/VICODIN) 5-325 MG per tablet Take 1 tablet by mouth every 6 (six) hours as needed for moderate pain. 30 tablet 0  . losartan-hydrochlorothiazide (HYZAAR) 100-25 MG per tablet Take 1 tablet by mouth daily. 90 tablet 3  . metFORMIN (GLUCOPHAGE) 1000 MG tablet Take 1 tablet (1,000 mg total) by mouth 2 (two) times daily with a meal. 180 tablet 3  . omeprazole (PRILOSEC) 20 MG capsule Take 1 capsule (20 mg total) by mouth 2 (two) times daily before a meal. 180 capsule 3  . Potassium 75 MG TABS Take 1 tablet by mouth daily.      Marland Kitchen tiZANidine (ZANAFLEX) 4 MG tablet Take 1 tablet (4 mg total) by mouth at bedtime. 90 tablet 3  . Venlafaxine HCl 75 MG TB24 Take 1 tablet (75 mg total) by mouth daily. 90 each 3   No current facility-administered medications on file prior to visit.    Allergies  Allergen Reactions  . Fluoxetine Hcl     REACTION: raised blood pressure  . Lisinopril     REACTION: dizzy  . Sertraline Hcl     REACTION: dizziness    Past Medical History  Diagnosis Date  . Asthma   . Diabetes mellitus     type 2  . Hypertension   . Fibromyalgia   . Osteoarthritis   . Depression   . Anal fissure   . Arthritis   . Obesity   . GERD (gastroesophageal reflux disease)   . Allergic rhinitis     Past Surgical History  Procedure Laterality Date  . Cataract extraction, bilateral    . Cholecystectomy  4/06  . Abdominal hysterectomy  1970's  . Tubal ligation  1970's  . Ventral hernia repair  9/08    [facility]    Family History  Problem Relation Age of Onset  . Heart attack Father   . Kidney cancer Mother   . Lung cancer Brother   . Liver cancer Brother   . Coronary artery disease Father   . Hypertension Neg Hx   . Diabetes Other     Maternal side  . Breast cancer Neg Hx   . Colon cancer Neg Hx     History   Social History  . Marital Status: Divorced    Spouse Name: N/A    Number of Children: 3  .  Years of Education: N/A   Occupational History  . Homemaker    Social History Main Topics  . Smoking status: Never Smoker   . Smokeless tobacco: Never Used  . Alcohol Use: No  . Drug Use: No  . Sexual Activity: Not on file   Other Topics Concern  . Not on file   Social History Narrative   No living will   No health care POA but would want son, [name], to make decisions about her care if needed   Would accept resuscitation attempts   Probably wouldn't want tube feeds if cognitively unaware   Review of Systems Vision blurry on right side due to ptosis Mild nausea--still eating some and drinking okay Some loose stools --normal for her    Objective:   Physical Exam  Eyes:  Conjunctiva is clear on right Has yellow discharge at lateral border of eye Upper lid is swollen, red and mildly warm          Assessment & Plan:

## 2013-12-25 NOTE — Assessment & Plan Note (Signed)
?  related to undissolved stitch from surgery Eye looks quiet Some systemic symptoms but nothing to suggest orbital cellulits (normal EOM, etc) Will treat with cephalexin Back to eye surgeon if persists

## 2014-02-19 ENCOUNTER — Other Ambulatory Visit: Payer: Self-pay | Admitting: Internal Medicine

## 2014-04-23 ENCOUNTER — Telehealth: Payer: Self-pay | Admitting: Internal Medicine

## 2014-04-23 NOTE — Telephone Encounter (Signed)
Patient has a lump on the left side of her neck and her left arm is hurting,headache and she's having body aches.  Patient will only see you and she wants to know if you can see her this week.

## 2014-04-23 NOTE — Telephone Encounter (Signed)
Please fit her  in sometime this week

## 2014-04-24 ENCOUNTER — Encounter: Payer: Self-pay | Admitting: Internal Medicine

## 2014-04-24 ENCOUNTER — Telehealth: Payer: Self-pay | Admitting: Radiology

## 2014-04-24 ENCOUNTER — Ambulatory Visit (INDEPENDENT_AMBULATORY_CARE_PROVIDER_SITE_OTHER): Payer: Medicare PPO | Admitting: Internal Medicine

## 2014-04-24 VITALS — BP 128/70 | HR 95 | Temp 98.6°F | Wt 207.8 lb

## 2014-04-24 DIAGNOSIS — R5383 Other fatigue: Secondary | ICD-10-CM

## 2014-04-24 DIAGNOSIS — D6489 Other specified anemias: Secondary | ICD-10-CM

## 2014-04-24 DIAGNOSIS — M7542 Impingement syndrome of left shoulder: Secondary | ICD-10-CM

## 2014-04-24 DIAGNOSIS — E1140 Type 2 diabetes mellitus with diabetic neuropathy, unspecified: Secondary | ICD-10-CM

## 2014-04-24 DIAGNOSIS — IMO0002 Reserved for concepts with insufficient information to code with codable children: Secondary | ICD-10-CM

## 2014-04-24 DIAGNOSIS — E1165 Type 2 diabetes mellitus with hyperglycemia: Secondary | ICD-10-CM

## 2014-04-24 LAB — COMPREHENSIVE METABOLIC PANEL
ALK PHOS: 66 U/L (ref 39–117)
ALT: 24 U/L (ref 0–35)
AST: 32 U/L (ref 0–37)
Albumin: 3.9 g/dL (ref 3.5–5.2)
BILIRUBIN TOTAL: 0.4 mg/dL (ref 0.2–1.2)
BUN: 32 mg/dL — AB (ref 6–23)
CO2: 25 meq/L (ref 19–32)
Calcium: 9 mg/dL (ref 8.4–10.5)
Chloride: 99 mEq/L (ref 96–112)
Creatinine, Ser: 1.29 mg/dL — ABNORMAL HIGH (ref 0.40–1.20)
GFR: 43.1 mL/min — AB (ref >60.00)
GLUCOSE: 221 mg/dL — AB (ref 70–99)
Potassium: 4.4 mEq/L (ref 3.5–5.1)
Sodium: 133 mEq/L — ABNORMAL LOW (ref 135–145)
Total Protein: 7.7 g/dL (ref 6.0–8.3)

## 2014-04-24 LAB — CBC WITH DIFFERENTIAL/PLATELET
BASOS PCT: 0.5 % (ref 0.0–3.0)
Basophils Absolute: 0 10*3/uL (ref 0.0–0.1)
Eosinophils Absolute: 0.1 10*3/uL (ref 0.0–0.7)
Eosinophils Relative: 1.4 % (ref 0.0–5.0)
HCT: 24.8 % — ABNORMAL LOW (ref 36.0–46.0)
Hemoglobin: 7.9 g/dL — CL (ref 12.0–15.0)
LYMPHS PCT: 15.7 % (ref 12.0–46.0)
Lymphs Abs: 1.3 10*3/uL (ref 0.7–4.0)
MCHC: 32 g/dL (ref 30.0–36.0)
MCV: 73.7 fl — ABNORMAL LOW (ref 78.0–100.0)
MONO ABS: 0.5 10*3/uL (ref 0.1–1.0)
MONOS PCT: 6.7 % (ref 3.0–12.0)
Neutro Abs: 6.2 10*3/uL (ref 1.4–7.7)
Neutrophils Relative %: 75.7 % (ref 43.0–77.0)
Platelets: 262 10*3/uL (ref 150.0–400.0)
RBC: 3.36 Mil/uL — AB (ref 3.87–5.11)
RDW: 17.6 % — ABNORMAL HIGH (ref 11.5–15.5)
WBC: 8.1 10*3/uL (ref 4.0–10.5)

## 2014-04-24 LAB — HEMOGLOBIN A1C: Hgb A1c MFr Bld: 10.1 % — ABNORMAL HIGH (ref 4.6–6.5)

## 2014-04-24 LAB — T4, FREE: Free T4: 0.95 ng/dL (ref 0.60–1.60)

## 2014-04-24 NOTE — Telephone Encounter (Signed)
[facility] called a critical report HGB - 7.9, results given to Dr Silvio Pate

## 2014-04-24 NOTE — Progress Notes (Signed)
Subjective:    Patient ID: [name], female    DOB: January 25, 1941, 74 y.o.   MRN: [id number]  HPI Here for several problems  "I have no energy" Has been getting dizzy and having headaches Not really orthostatic---when she gets up and then does something like making the beds--she gets dizzy No syncope Finds it hard to do any activity due to this  Has knot in left neck--noticed it last week No other knots Mildly tender---like it radiates down to left shoulder  No trouble moving left arm Just hard lying on it No recent task  Has tried to eat healthier Has lost a few pounds Hasn't been checking sugars  Current Outpatient Prescriptions on File Prior to Visit  Medication Sig Dispense Refill  . aspirin 81 MG tablet Take 81 mg by mouth daily.      . celecoxib (CELEBREX) 200 MG capsule TAKE ONE CAPSULE BY MOUTH EVERY DAY 90 capsule 3  . doxazosin (CARDURA) 4 MG tablet TAKE 1/2 TABLET TWICE DAILY 90 tablet 3  . gabapentin (NEURONTIN) 100 MG capsule Take 1 capsule (100 mg total) by mouth 2 (two) times daily. 180 capsule 3  . GLIPIZIDE XL 5 MG 24 hr tablet TAKE 1 TABLET BY MOUTH DAILY WITH BREAKFAST. 90 tablet 3  . losartan-hydrochlorothiazide (HYZAAR) 100-25 MG per tablet TAKE 1 TABLET BY MOUTH DAILY. 90 tablet 3  . metFORMIN (GLUCOPHAGE) 1000 MG tablet TAKE 1 TABLET TWICE DAILY WITH MEAL 180 tablet 3  . omeprazole (PRILOSEC) 20 MG capsule Take 1 capsule (20 mg total) by mouth 2 (two) times daily before a meal. 180 capsule 3  . Potassium 75 MG TABS Take 1 tablet by mouth daily.      Marland Kitchen tiZANidine (ZANAFLEX) 4 MG tablet TAKE 1 TABLET AT BEDTIME 90 tablet 3  . Venlafaxine HCl 75 MG TB24 Take 1 tablet (75 mg total) by mouth daily. 90 each 3  . HYDROcodone-acetaminophen (NORCO/VICODIN) 5-325 MG per tablet Take 1 tablet by mouth every 6 (six) hours as needed for moderate pain. (Patient not taking: Reported on 04/24/2014) 30 tablet 0   No current facility-administered medications on file  prior to visit.    Allergies  Allergen Reactions  . Fluoxetine Hcl     REACTION: raised blood pressure  . Lisinopril     REACTION: dizzy  . Sertraline Hcl     REACTION: dizziness    Past Medical History  Diagnosis Date  . Asthma   . Diabetes mellitus     type 2  . Hypertension   . Fibromyalgia   . Osteoarthritis   . Depression   . Anal fissure   . Arthritis   . Obesity   . GERD (gastroesophageal reflux disease)   . Allergic rhinitis     Past Surgical History  Procedure Laterality Date  . Cataract extraction, bilateral    . Cholecystectomy  4/06  . Abdominal hysterectomy  1970's  . Tubal ligation  1970's  . Ventral hernia repair  9/08    [facility]    Family History  Problem Relation Age of Onset  . Heart attack Father   . Kidney cancer Mother   . Lung cancer Brother   . Liver cancer Brother   . Coronary artery disease Father   . Hypertension Neg Hx   . Diabetes Other     Maternal side  . Breast cancer Neg Hx   . Colon cancer Neg Hx     History   Social History  .  Marital Status: Divorced    Spouse Name: N/A  . Number of Children: 3  . Years of Education: N/A   Occupational History  . Homemaker    Social History Main Topics  . Smoking status: Never Smoker   . Smokeless tobacco: Never Used  . Alcohol Use: No  . Drug Use: No  . Sexual Activity: Not on file   Other Topics Concern  . Not on file   Social History Narrative   No living will   No health care POA but would want son, [name], to make decisions about her care if needed   Would accept resuscitation attempts   Probably wouldn't want tube feeds if cognitively unaware   Review of Systems Some pain in upper abdomen --esp with bending Trying to be careful with eating--- has cut peanuts out again Some cough for about a month--finally better recently Not sleeping well    Objective:   Physical Exam  Constitutional: She appears well-developed and well-nourished. No distress.  Neck:  Normal range of motion. Neck supple. No thyromegaly present.  Normal sized posterior cervical nodes at [facility] bilaterally  Cardiovascular: Normal rate, regular rhythm, normal heart sounds and intact distal pulses.  Exam reveals no gallop.   No murmur heard. Pulmonary/Chest: Effort normal and breath sounds normal. No respiratory distress. She has no wheezes. She has no rales.  Abdominal: Soft. There is no tenderness.  Musculoskeletal: She exhibits no edema.  Left shoulder decreased passive abduction and external rotation  Lymphadenopathy:       Head (right side): No submental, no submandibular, no tonsillar, no preauricular, no posterior auricular and no occipital adenopathy present.       Head (left side): No submental, no submandibular, no tonsillar, no preauricular, no posterior auricular and no occipital adenopathy present.    She has no axillary adenopathy.       Right: No inguinal, no supraclavicular and no epitrochlear adenopathy present.       Left: No inguinal, no supraclavicular and no epitrochlear adenopathy present.  Psychiatric: She has a normal mood and affect. Her behavior is normal.          Assessment & Plan:

## 2014-04-24 NOTE — Progress Notes (Signed)
Pre visit review using our clinic review tool, if applicable. No additional management support is needed unless otherwise documented below in the visit note. 

## 2014-04-24 NOTE — Assessment & Plan Note (Signed)
Hopefully better control ---but if worse, could explain some fatigue Will check this with her blood work as well

## 2014-04-24 NOTE — Assessment & Plan Note (Signed)
Not too severe Discussed PT --she wants to hold off for now She will use heat and work on increasing ROM herself Referral if not improving

## 2014-04-24 NOTE — Assessment & Plan Note (Signed)
Has had some achiness and stopped all her activities Reassured about the normal sized nodes---and full node exam negative otherwise No HSM on exam either  Will just check labs Asked her to get outside and start walking again

## 2014-04-24 NOTE — Assessment & Plan Note (Signed)
No clear diagnosis Chronic May be worth hematology evaluation

## 2014-04-25 NOTE — Telephone Encounter (Signed)
Not a hugh change but will set up hematology eval as we discussed

## 2014-04-25 NOTE — Telephone Encounter (Signed)
May need GI evaluation as well but will start with hematology Results released on MyChart--she really needs to work on healthy eating also as her A1c really jumped up

## 2014-04-26 ENCOUNTER — Telehealth: Payer: Self-pay | Admitting: Internal Medicine

## 2014-04-26 NOTE — Telephone Encounter (Signed)
CALLED, SCHEDULED, AND CONFIRMED APPT WITH PATIENT.   MOHAMED 05/31/14 11:00 AM DX: ANEMIA DUE TO OTHER CAUSE REFERRING: DR Silvio Pate

## 2014-04-26 NOTE — Telephone Encounter (Signed)
Chart made on 04/26/14.  TG

## 2014-05-31 ENCOUNTER — Encounter: Payer: Self-pay | Admitting: Internal Medicine

## 2014-05-31 ENCOUNTER — Ambulatory Visit (HOSPITAL_BASED_OUTPATIENT_CLINIC_OR_DEPARTMENT_OTHER): Payer: Medicare PPO | Admitting: Internal Medicine

## 2014-05-31 ENCOUNTER — Ambulatory Visit: Payer: Medicare PPO

## 2014-05-31 ENCOUNTER — Telehealth: Payer: Self-pay | Admitting: Internal Medicine

## 2014-05-31 ENCOUNTER — Other Ambulatory Visit: Payer: Self-pay | Admitting: Internal Medicine

## 2014-05-31 ENCOUNTER — Ambulatory Visit (HOSPITAL_BASED_OUTPATIENT_CLINIC_OR_DEPARTMENT_OTHER): Payer: Medicare PPO

## 2014-05-31 VITALS — BP 140/70 | HR 87 | Temp 98.5°F | Resp 18 | Ht 63.5 in | Wt 215.0 lb

## 2014-05-31 DIAGNOSIS — D539 Nutritional anemia, unspecified: Secondary | ICD-10-CM

## 2014-05-31 DIAGNOSIS — D509 Iron deficiency anemia, unspecified: Secondary | ICD-10-CM

## 2014-05-31 DIAGNOSIS — Z8589 Personal history of malignant neoplasm of other organs and systems: Secondary | ICD-10-CM

## 2014-05-31 DIAGNOSIS — I10 Essential (primary) hypertension: Secondary | ICD-10-CM | POA: Diagnosis not present

## 2014-05-31 DIAGNOSIS — Z808 Family history of malignant neoplasm of other organs or systems: Secondary | ICD-10-CM

## 2014-05-31 DIAGNOSIS — Z801 Family history of malignant neoplasm of trachea, bronchus and lung: Secondary | ICD-10-CM

## 2014-05-31 DIAGNOSIS — E119 Type 2 diabetes mellitus without complications: Secondary | ICD-10-CM | POA: Diagnosis not present

## 2014-05-31 DIAGNOSIS — R197 Diarrhea, unspecified: Secondary | ICD-10-CM | POA: Diagnosis not present

## 2014-05-31 LAB — COMPREHENSIVE METABOLIC PANEL (CC13)
ALT: 23 U/L (ref 0–55)
AST: 22 U/L (ref 5–34)
Albumin: 3.4 g/dL — ABNORMAL LOW (ref 3.5–5.0)
Alkaline Phosphatase: 77 U/L (ref 40–150)
Anion Gap: 14 mEq/L — ABNORMAL HIGH (ref 3–11)
BUN: 27.5 mg/dL — ABNORMAL HIGH (ref 7.0–26.0)
CHLORIDE: 102 meq/L (ref 98–109)
CO2: 22 meq/L (ref 22–29)
CREATININE: 1.2 mg/dL — AB (ref 0.6–1.1)
Calcium: 9.1 mg/dL (ref 8.4–10.4)
EGFR: 45 mL/min/{1.73_m2} — AB (ref >90)
Glucose: 217 mg/dl — ABNORMAL HIGH (ref 70–140)
Potassium: 5.4 mEq/L — ABNORMAL HIGH (ref 3.5–5.1)
Sodium: 137 mEq/L (ref 136–145)
TOTAL PROTEIN: 7.7 g/dL (ref 6.4–8.3)
Total Bilirubin: <0.2 mg/dL (ref 0.20–1.20)

## 2014-05-31 LAB — CBC WITH DIFFERENTIAL/PLATELET
BASO%: 0.3 % (ref 0.0–2.0)
Basophils Absolute: 0 10*3/uL (ref 0.0–0.1)
EOS ABS: 0.2 10*3/uL (ref 0.0–0.5)
EOS%: 2.1 % (ref 0.0–7.0)
HCT: 24.9 % — ABNORMAL LOW (ref 34.8–46.6)
HEMOGLOBIN: 7.4 g/dL — AB (ref 11.6–15.9)
LYMPH#: 1.8 10*3/uL (ref 0.9–3.3)
LYMPH%: 22.5 % (ref 14.0–49.7)
MCH: 23.1 pg — ABNORMAL LOW (ref 25.1–34.0)
MCHC: 29.7 g/dL — ABNORMAL LOW (ref 31.5–36.0)
MCV: 77.6 fL — ABNORMAL LOW (ref 79.5–101.0)
MONO#: 0.5 10*3/uL (ref 0.1–0.9)
MONO%: 6 % (ref 0.0–14.0)
NEUT%: 69.1 % (ref 38.4–76.8)
NEUTROS ABS: 5.4 10*3/uL (ref 1.5–6.5)
Platelets: 218 10*3/uL (ref 145–400)
RBC: 3.21 10*6/uL — ABNORMAL LOW (ref 3.70–5.45)
RDW: 16.9 % — AB (ref 11.2–14.5)
WBC: 7.8 10*3/uL (ref 3.9–10.3)
nRBC: 0 % (ref 0–0)

## 2014-05-31 LAB — IRON AND TIBC CHCC
%SAT: 5 % — AB (ref 21–57)
Iron: 22 ug/dL — ABNORMAL LOW (ref 41–142)
TIBC: 411 ug/dL (ref 236–444)
UIBC: 389 ug/dL — AB (ref 120–384)

## 2014-05-31 LAB — LACTATE DEHYDROGENASE (CC13): LDH: 152 U/L (ref 125–245)

## 2014-05-31 LAB — FERRITIN CHCC: Ferritin: 12 ng/ml (ref 9–269)

## 2014-05-31 MED ORDER — INTEGRA PLUS PO CAPS: 1.0000 | ORAL_CAPSULE | Freq: Every morning | ORAL | Status: DC

## 2014-05-31 NOTE — Progress Notes (Signed)
Checked in new pt with no financial concerns. °

## 2014-05-31 NOTE — Telephone Encounter (Signed)
gave and printed appt sched and avs for pt for April and JUNE

## 2014-05-31 NOTE — Progress Notes (Signed)
[place] [phone]   [phone]  CONSULT NOTE  REFERRING PHYSICIAN: Dr. [name]  REASON FOR CONSULTATION:  74 years old white female with persistent microcytic anemia.  HPI [name] is a 74 y.o. female with past medical history significant for hypertension, GERD, fibromyalgia, and diabetic neuropathy, diabetes mellitus, obesity as well as allergic rhinitis and basal cell carcinoma. The patient was seen recently by her primary care physician and CBC performed on 04/24/2014 showed further drop in her hemoglobin down to 7.9 and hematocrit 24.8%. The patient was noted over the last few years to have continuous decline in her hemoglobin and hematocrit. Her last upper endoscopy and colonoscopy were performed in 2009. The patient denied having any rectal bleeding. She has no bruises or ecchymosis. She is not currently on any iron supplements. She she is tired all the time. She has intermittent pain in the abdomen including right upper quadrant and left upper quadrant as well as few episodes of diarrhea depending on the types of food she eats but this could be up to 3-4 times a day.  She denied having any other significant complaints today. She has no chest pain but has shortness breath with exertion with no cough or hemoptysis. She denied having any headache or visual changes. She has no significant nausea or vomiting. Family history significant for mother who had renal cell carcinoma, father died from heart attack, Brother had lung cancer and another brother with liver cancer. The patient is single and has 3 children. She was accompanied today by her daughter, [name]. She has no history of smoking, alcohol or drug abuse. HPI  Past Medical History  Diagnosis Date  . Asthma   . Diabetes mellitus     type 2  . Hypertension   . Fibromyalgia   . Osteoarthritis   . Depression   . Anal fissure   . Arthritis   . Obesity   . GERD (gastroesophageal  reflux disease)   . Allergic rhinitis     Past Surgical History  Procedure Laterality Date  . Cataract extraction, bilateral    . Cholecystectomy  4/06  . Abdominal hysterectomy  1970's  . Tubal ligation  1970's  . Ventral hernia repair  9/08    [facility]    Family History  Problem Relation Age of Onset  . Heart attack Father   . Kidney cancer Mother   . Lung cancer Brother   . Liver cancer Brother   . Coronary artery disease Father   . Hypertension Neg Hx   . Diabetes Other     Maternal side  . Breast cancer Neg Hx   . Colon cancer Neg Hx     Social History History  Substance Use Topics  . Smoking status: Never Smoker   . Smokeless tobacco: Never Used  . Alcohol Use: No    Allergies  Allergen Reactions  . Fluoxetine Hcl     REACTION: raised blood pressure  . Lisinopril     REACTION: dizzy  . Sertraline Hcl     REACTION: dizziness    Current Outpatient Prescriptions  Medication Sig Dispense Refill  . aspirin 81 MG tablet Take 81 mg by mouth daily.      . celecoxib (CELEBREX) 200 MG capsule TAKE ONE CAPSULE BY MOUTH EVERY DAY 90 capsule 3  . doxazosin (CARDURA) 4 MG tablet TAKE 1/2 TABLET TWICE DAILY 90 tablet 3  . gabapentin (NEURONTIN) 100 MG capsule Take 1 capsule (100  mg total) by mouth 2 (two) times daily. 180 capsule 3  . GLIPIZIDE XL 5 MG 24 hr tablet TAKE 1 TABLET BY MOUTH DAILY WITH BREAKFAST. 90 tablet 3  . losartan-hydrochlorothiazide (HYZAAR) 100-25 MG per tablet TAKE 1 TABLET BY MOUTH DAILY. 90 tablet 3  . metFORMIN (GLUCOPHAGE) 1000 MG tablet TAKE 1 TABLET TWICE DAILY WITH MEAL 180 tablet 3  . omeprazole (PRILOSEC) 20 MG capsule Take 1 capsule (20 mg total) by mouth 2 (two) times daily before a meal. 180 capsule 3  . Potassium 75 MG TABS Take 1 tablet by mouth daily.      Marland Kitchen tiZANidine (ZANAFLEX) 4 MG tablet TAKE 1 TABLET AT BEDTIME 90 tablet 3  . Venlafaxine HCl 75 MG TB24 Take 1 tablet (75 mg total) by mouth daily. 90 each 3  . FeFum-FePoly-FA-B  Cmp-C-Biot (INTEGRA PLUS) CAPS Take 1 capsule by mouth every morning. 30 capsule 2  . HYDROcodone-acetaminophen (NORCO/VICODIN) 5-325 MG per tablet Take 1 tablet by mouth every 6 (six) hours as needed for moderate pain. (Patient not taking: Reported on 04/24/2014) 30 tablet 0   No current facility-administered medications for this visit.    Review of Systems  Constitutional: positive for fatigue Eyes: negative Ears, nose, mouth, throat, and face: negative Respiratory: positive for dyspnea on exertion Cardiovascular: negative Gastrointestinal: positive for abdominal pain and diarrhea Genitourinary:negative Integument/breast: negative Hematologic/lymphatic: negative Musculoskeletal:negative Neurological: negative Behavioral/Psych: negative Endocrine: negative Allergic/Immunologic: negative  Physical Exam  JJO:ACZYS, healthy, no distress, well nourished and well developed SKIN: skin color, texture, turgor are normal, no rashes or significant lesions HEAD: Normocephalic, No masses, lesions, tenderness or abnormalities EYES: normal, PERRLA EARS: External ears normal, Canals clear OROPHARYNX:no exudate, no erythema and lips, buccal mucosa, and tongue normal  NECK: supple, no adenopathy, no JVD LYMPH:  no palpable lymphadenopathy, no hepatosplenomegaly BREAST:not examined LUNGS: clear to auscultation , and palpation HEART: regular rate & rhythm and no murmurs ABDOMEN:abdomen soft, non-tender, obese, normal bowel sounds and no masses or organomegaly BACK: Back symmetric, no curvature., No CVA tenderness EXTREMITIES:no joint deformities, effusion, or inflammation, no edema, no skin discoloration  NEURO: alert & oriented x 3 with fluent speech, no focal motor/sensory deficits  PERFORMANCE STATUS: ECOG 1  LABORATORY DATA: Lab Results  Component Value Date   WBC 7.8 05/31/2014   HGB 7.4* 05/31/2014   HCT 24.9* 05/31/2014   MCV 77.6* 05/31/2014   PLT 218 05/31/2014       Chemistry      Component Value Date/Time   NA 133* 04/24/2014 1349   K 4.4 04/24/2014 1349   CL 99 04/24/2014 1349   CO2 25 04/24/2014 1349   BUN 32* 04/24/2014 1349   CREATININE 1.29* 04/24/2014 1349      Component Value Date/Time   CALCIUM 9.0 04/24/2014 1349   ALKPHOS 66 04/24/2014 1349   AST 32 04/24/2014 1349   ALT 24 04/24/2014 1349   BILITOT 0.4 04/24/2014 1349       RADIOGRAPHIC STUDIES: No results found.  ASSESSMENT: This is a very pleasant 74 years old white female with persistent microcytic anemia most likely secondary to iron deficiency from gastrointestinal blood loss or malabsorption. The patient has several episodes of diarrhea on daily basis and this may contribute to malabsorption of iron. Her last endoscopy and colonoscopy were in 2009.   PLAN: I had a lengthy discussion with the patient and her daughter today about her condition. I will arrange for the patient to have Hemoccult stool test to rule  out gastrointestinal blood loss and if positive the patient may need to see her gastroenterologist for evaluation and repeat upper endoscopy and colonoscopy. I will do several studies today for evaluation of her anemia including repeat CBC, comprehensive metabolic panel, LDH, iron study, ferritin, serum folate, serum vitamin B 12, serum erythropoietin as well as serum protein electrophoreses. It is clearly that the patient has iron deficiency. I will arrange for the patient to receive Feraheme infusion 510 mg IV weekly for 2 doses, first dose tomorrow. I will also start the patient on oral iron supplements in the form of Integra +1 capsule by mouth daily. I will see the patient back for follow-up visit in 6 weeks for reevaluation and repeat CBC, iron study and ferritin. I gave the patient and her daughter the time to ask questions and I answered them completely to their satisfaction. She was advised to call immediately if she has any concerning symptoms in the  interval.  The patient voices understanding of current disease status and treatment options and is in agreement with the current care plan.  All questions were answered. The patient knows to call the clinic with any problems, questions or concerns. We can certainly see the patient much sooner if necessary.  Thank you so much for allowing me to participate in the care of [name]. I will continue to follow up the patient with you and assist in her care.  I spent 40 minutes counseling the patient face to face. The total time spent in the appointment was 60 minutes.  Disclaimer: This note was dictated with voice recognition software. Similar sounding words can inadvertently be transcribed and may not be corrected upon review.   Anna Jester K. May 31, 2014, 12:17 PM

## 2014-06-01 ENCOUNTER — Ambulatory Visit (HOSPITAL_BASED_OUTPATIENT_CLINIC_OR_DEPARTMENT_OTHER): Payer: Medicare PPO

## 2014-06-01 VITALS — BP 120/60 | HR 69 | Temp 97.3°F | Resp 18

## 2014-06-01 DIAGNOSIS — D509 Iron deficiency anemia, unspecified: Secondary | ICD-10-CM

## 2014-06-01 DIAGNOSIS — D539 Nutritional anemia, unspecified: Secondary | ICD-10-CM

## 2014-06-01 MED ORDER — SODIUM CHLORIDE 0.9 % IV SOLN
Freq: Once | INTRAVENOUS | Status: AC
  Administered 2014-06-01: 13:00:00 via INTRAVENOUS

## 2014-06-01 MED ORDER — SODIUM CHLORIDE 0.9 % IV SOLN
510.0000 mg | Freq: Once | INTRAVENOUS | Status: AC
  Administered 2014-06-01: 510 mg via INTRAVENOUS
  Filled 2014-06-01: qty 17

## 2014-06-01 NOTE — Progress Notes (Signed)
Pt tolerated 1st Feraheme well; observed 30 mins post feraheme infusion, then discharged ambulatory in no acute distress.

## 2014-06-01 NOTE — Patient Instructions (Signed)

## 2014-06-04 LAB — PROTEIN ELECTROPHORESIS, SERUM, WITH REFLEX
ALBUMIN ELP: 3.5 g/dL — AB (ref 3.8–4.8)
Alpha-1-Globulin: 0.3 g/dL (ref 0.2–0.3)
Alpha-2-Globulin: 0.8 g/dL (ref 0.5–0.9)
BETA GLOBULIN: 0.6 g/dL (ref 0.4–0.6)
Beta 2: 0.6 g/dL — ABNORMAL HIGH (ref 0.2–0.5)
Gamma Globulin: 1.5 g/dL (ref 0.8–1.7)
Total Protein, Serum Electrophoresis: 7.3 g/dL (ref 6.1–8.1)

## 2014-06-04 LAB — IFE INTERPRETATION

## 2014-06-04 LAB — IGG, IGA, IGM
IGA: 641 mg/dL — AB (ref 69–380)
IGG (IMMUNOGLOBIN G), SERUM: 1670 mg/dL (ref 690–1700)
IgM, Serum: 144 mg/dL (ref 52–322)

## 2014-06-04 LAB — VITAMIN B12: Vitamin B-12: 316 pg/mL (ref 211–911)

## 2014-06-04 LAB — FOLATE: FOLATE: 10.3 ng/mL

## 2014-06-04 LAB — ERYTHROPOIETIN: ERYTHROPOIETIN: 127.9 m[IU]/mL — AB (ref 2.6–18.5)

## 2014-06-08 ENCOUNTER — Ambulatory Visit (HOSPITAL_BASED_OUTPATIENT_CLINIC_OR_DEPARTMENT_OTHER): Payer: Medicare PPO

## 2014-06-08 VITALS — BP 177/92 | HR 99 | Temp 98.3°F | Resp 18 | Ht 63.5 in

## 2014-06-08 DIAGNOSIS — D539 Nutritional anemia, unspecified: Secondary | ICD-10-CM

## 2014-06-08 DIAGNOSIS — D509 Iron deficiency anemia, unspecified: Secondary | ICD-10-CM

## 2014-06-08 MED ORDER — FERUMOXYTOL INJECTION 510 MG/17 ML
510.0000 mg | Freq: Once | INTRAVENOUS | Status: AC
  Administered 2014-06-08: 510 mg via INTRAVENOUS
  Filled 2014-06-08: qty 17

## 2014-06-08 MED ORDER — SODIUM CHLORIDE 0.9 % IV SOLN
Freq: Once | INTRAVENOUS | Status: AC
  Administered 2014-06-08: 13:00:00 via INTRAVENOUS

## 2014-06-08 NOTE — Patient Instructions (Signed)

## 2014-06-12 LAB — HM DIABETES EYE EXAM

## 2014-06-15 ENCOUNTER — Encounter: Payer: Self-pay | Admitting: Internal Medicine

## 2014-06-15 ENCOUNTER — Ambulatory Visit (INDEPENDENT_AMBULATORY_CARE_PROVIDER_SITE_OTHER): Payer: Medicare PPO | Admitting: Internal Medicine

## 2014-06-15 VITALS — BP 140/90 | HR 89 | Temp 97.7°F | Ht 64.0 in | Wt 215.0 lb

## 2014-06-15 DIAGNOSIS — IMO0002 Reserved for concepts with insufficient information to code with codable children: Secondary | ICD-10-CM

## 2014-06-15 DIAGNOSIS — E1165 Type 2 diabetes mellitus with hyperglycemia: Secondary | ICD-10-CM

## 2014-06-15 DIAGNOSIS — Z0000 Encounter for general adult medical examination without abnormal findings: Secondary | ICD-10-CM

## 2014-06-15 DIAGNOSIS — I10 Essential (primary) hypertension: Secondary | ICD-10-CM

## 2014-06-15 DIAGNOSIS — E1140 Type 2 diabetes mellitus with diabetic neuropathy, unspecified: Secondary | ICD-10-CM

## 2014-06-15 DIAGNOSIS — D539 Nutritional anemia, unspecified: Secondary | ICD-10-CM

## 2014-06-15 DIAGNOSIS — F39 Unspecified mood [affective] disorder: Secondary | ICD-10-CM

## 2014-06-15 LAB — HM DIABETES FOOT EXAM

## 2014-06-15 NOTE — Assessment & Plan Note (Signed)
Mood is better Will continue the venlafaxine

## 2014-06-15 NOTE — Assessment & Plan Note (Signed)
She is trying to work on healthier lifestyle Will recheck in 3 months

## 2014-06-15 NOTE — Progress Notes (Signed)
Pre visit review using our clinic review tool, if applicable. No additional management support is needed unless otherwise documented below in the visit note. 

## 2014-06-15 NOTE — Patient Instructions (Signed)
Please set up your screening mammogram. 

## 2014-06-15 NOTE — Assessment & Plan Note (Signed)
I have personally reviewed the Medicare Annual Wellness questionnaire and have noted 1. The patient's medical and social history 2. Their use of alcohol, tobacco or illicit drugs 3. Their current medications and supplements 4. The patient's functional ability including ADL's, fall risks, home safety risks and hearing or visual             impairment. 5. Diet and physical activities 6. Evidence for depression or mood disorders  The patients weight, height, BMI and visual acuity have been recorded in the chart I have made referrals, counseling and provided education to the patient based review of the above and I have provided the pt with a written personalized care plan for preventive services.  I have provided you with a copy of your personalized plan for preventive services. Please take the time to review along with your updated medication list.  Due for screening mammogram--she will set up Getting GI referral due to anemia UTD with all imms--needs yearly flu shot No Pap due to age

## 2014-06-15 NOTE — Progress Notes (Signed)
Subjective:    Patient ID: [name], female    DOB: 03-Oct-1940, 74 y.o.   MRN: [id number]  HPI Here for Medicare wellness and follow up of chronic medical conditions Reviewed form and advanced directives  Did see Dr Mora Appl Got iron infusions Still not a lot of energy--but better Not dizzy Needs GI referral  Trying to eat healthier Got juicer type device-- hopes to drink healthy things Still not exercising though Not checking sugars No retinopathy--recent exam Does have some burning in feet--no ulcers  Mood is some better No regular anxiety No regular depressed mood Not anhedonic  No chest pain No SOB Some swelling in right foot--not on left. Not really new. No pain  Current Outpatient Prescriptions on File Prior to Visit  Medication Sig Dispense Refill  . aspirin 81 MG tablet Take 81 mg by mouth daily.      . celecoxib (CELEBREX) 200 MG capsule TAKE ONE CAPSULE BY MOUTH EVERY DAY 90 capsule 3  . doxazosin (CARDURA) 4 MG tablet TAKE 1/2 TABLET TWICE DAILY 90 tablet 3  . FeFum-FePoly-FA-B Cmp-C-Biot (INTEGRA PLUS) CAPS Take 1 capsule by mouth every morning. 30 capsule 2  . gabapentin (NEURONTIN) 100 MG capsule Take 1 capsule (100 mg total) by mouth 2 (two) times daily. 180 capsule 3  . GLIPIZIDE XL 5 MG 24 hr tablet TAKE 1 TABLET BY MOUTH DAILY WITH BREAKFAST. 90 tablet 3  . losartan-hydrochlorothiazide (HYZAAR) 100-25 MG per tablet TAKE 1 TABLET BY MOUTH DAILY. 90 tablet 3  . metFORMIN (GLUCOPHAGE) 1000 MG tablet TAKE 1 TABLET TWICE DAILY WITH MEAL 180 tablet 3  . omeprazole (PRILOSEC) 20 MG capsule Take 1 capsule (20 mg total) by mouth 2 (two) times daily before a meal. 180 capsule 3  . Potassium 75 MG TABS Take 1 tablet by mouth daily.      Marland Kitchen tiZANidine (ZANAFLEX) 4 MG tablet TAKE 1 TABLET AT BEDTIME 90 tablet 3  . Venlafaxine HCl 75 MG TB24 Take 1 tablet (75 mg total) by mouth daily. 90 each 3   No current facility-administered medications on file prior to  visit.    Allergies  Allergen Reactions  . Fluoxetine Hcl     REACTION: raised blood pressure  . Lisinopril     REACTION: dizzy  . Sertraline Hcl     REACTION: dizziness    Past Medical History  Diagnosis Date  . Asthma   . Diabetes mellitus     type 2  . Hypertension   . Fibromyalgia   . Osteoarthritis   . Depression   . Anal fissure   . Arthritis   . Obesity   . GERD (gastroesophageal reflux disease)   . Allergic rhinitis     Past Surgical History  Procedure Laterality Date  . Cataract extraction, bilateral    . Cholecystectomy  4/06  . Abdominal hysterectomy  1970's  . Tubal ligation  1970's  . Ventral hernia repair  9/08    [facility]    Family History  Problem Relation Age of Onset  . Heart attack Father   . Kidney cancer Mother   . Lung cancer Brother   . Liver cancer Brother   . Coronary artery disease Father   . Hypertension Neg Hx   . Diabetes Other     Maternal side  . Breast cancer Neg Hx   . Colon cancer Neg Hx     History   Social History  . Marital Status: Divorced  Spouse Name: N/A  . Number of Children: 3  . Years of Education: N/A   Occupational History  . Homemaker    Social History Main Topics  . Smoking status: Never Smoker   . Smokeless tobacco: Never Used  . Alcohol Use: No  . Drug Use: No  . Sexual Activity: Not on file   Other Topics Concern  . Not on file   Social History Narrative   No living will   No health care POA but would want son, [name], to make decisions about her care if needed   Would accept resuscitation attempts   Probably wouldn't want tube feeds if cognitively unaware   Review of Systems Infected spot by right eye--has follow up at [facility] (in stitch from surgery) Weight stable Still not sleeping well--nothing new Shoulder is better Overdue for dentist--mostly dentures Wears seat belt    Objective:   Physical Exam  Constitutional: She is oriented to person, place, and time. She appears  well-developed and well-nourished. No distress.  HENT:  Mouth/Throat: Oropharynx is clear and moist.  Neck: Normal range of motion. Neck supple. No thyromegaly present.  Cardiovascular: Normal rate, regular rhythm, normal heart sounds and intact distal pulses.  Exam reveals no gallop.   No murmur heard. Pulmonary/Chest: Effort normal and breath sounds normal. No respiratory distress. She has no wheezes. She has no rales.  Abdominal: Soft. There is no tenderness.  Musculoskeletal: She exhibits no edema or tenderness.  Lymphadenopathy:    She has no cervical adenopathy.  Neurological: She is alert and oriented to person, place, and time.  President-- "Obama, Tawni Pummel, Clinton" [phone]  "Terrible in math" D-l-r-[id number]-w Recall 3/3  Mild decreased fine touch sensation in feet  Skin: No rash noted. No erythema.  No foot lesions  Psychiatric: She has a normal mood and affect. Her behavior is normal.          Assessment & Plan:

## 2014-06-15 NOTE — Assessment & Plan Note (Signed)
BP Readings from Last 3 Encounters:  06/15/14 140/90  06/08/14 177/92  06/01/14 120/60   Reasonable control

## 2014-06-15 NOTE — Assessment & Plan Note (Signed)
Iron deficiency and may need B12 Will proceed with GI eval

## 2014-07-03 ENCOUNTER — Ambulatory Visit (INDEPENDENT_AMBULATORY_CARE_PROVIDER_SITE_OTHER): Payer: Medicare PPO | Admitting: Family Medicine

## 2014-07-03 ENCOUNTER — Encounter: Payer: Self-pay | Admitting: Family Medicine

## 2014-07-03 ENCOUNTER — Encounter: Payer: Self-pay | Admitting: Internal Medicine

## 2014-07-03 VITALS — BP 110/70 | HR 96 | Temp 98.7°F | Wt 211.5 lb

## 2014-07-03 DIAGNOSIS — B349 Viral infection, unspecified: Secondary | ICD-10-CM | POA: Diagnosis not present

## 2014-07-03 NOTE — Progress Notes (Signed)
Pre visit review using our clinic review tool, if applicable. No additional management support is needed unless otherwise documented below in the visit note.  Sx started a few days ago.  Pain on L side of the neck.  Pain behind B ears.  She noted some knots on her side/posterior neck.  R sided HA.  L ear is popping and has a static type noise.  No FCVD.  Some nausea.  Some ST as of this AM.  No cough.  No one else sick.    Meds, vitals, and allergies reviewed.   ROS: See HPI.  Otherwise, noncontributory.  GEN: nad, alert and oriented HEENT: mucous membranes moist, tm w/o erythema, nasal exam w/o erythema, clear discharge noted,  OP with cobblestoning, nontender small B postauricular LA noted NECK: supple w/shotty LA CV: rrr.   PULM: ctab, no inc wob EXT: no edema SKIN: no acute rash

## 2014-07-03 NOTE — Patient Instructions (Addendum)
Rest, fluids, heat on your neck, and nasal saline as needed.  Gently try to pop your ears.  The lymph notes should gradually go down.   Take care.  Glad to see you.

## 2014-07-04 ENCOUNTER — Ambulatory Visit: Payer: Medicare PPO | Admitting: Physician Assistant

## 2014-07-04 DIAGNOSIS — B349 Viral infection, unspecified: Secondary | ICD-10-CM | POA: Insufficient documentation

## 2014-07-04 NOTE — Assessment & Plan Note (Signed)
Likely, nontoxic, reassured.  See AVS.  F/u prn.   Supportive care in meantime.

## 2014-07-12 ENCOUNTER — Other Ambulatory Visit (HOSPITAL_BASED_OUTPATIENT_CLINIC_OR_DEPARTMENT_OTHER): Payer: Medicare PPO

## 2014-07-12 DIAGNOSIS — D509 Iron deficiency anemia, unspecified: Secondary | ICD-10-CM

## 2014-07-12 DIAGNOSIS — D539 Nutritional anemia, unspecified: Secondary | ICD-10-CM

## 2014-07-12 LAB — CBC WITH DIFFERENTIAL/PLATELET
BASO%: 0.4 % (ref 0.0–2.0)
BASOS ABS: 0 10*3/uL (ref 0.0–0.1)
EOS%: 2.1 % (ref 0.0–7.0)
Eosinophils Absolute: 0.2 10*3/uL (ref 0.0–0.5)
HCT: 30.8 % — ABNORMAL LOW (ref 34.8–46.6)
HGB: 10.1 g/dL — ABNORMAL LOW (ref 11.6–15.9)
LYMPH#: 1.4 10*3/uL (ref 0.9–3.3)
LYMPH%: 19.1 % (ref 14.0–49.7)
MCH: 28.5 pg (ref 25.1–34.0)
MCHC: 32.8 g/dL (ref 31.5–36.0)
MCV: 86.9 fL (ref 79.5–101.0)
MONO#: 0.4 10*3/uL (ref 0.1–0.9)
MONO%: 6.1 % (ref 0.0–14.0)
NEUT#: 5.2 10*3/uL (ref 1.5–6.5)
NEUT%: 72.3 % (ref 38.4–76.8)
PLATELETS: 212 10*3/uL (ref 145–400)
RBC: 3.55 10*6/uL — AB (ref 3.70–5.45)
RDW: 24.7 % — ABNORMAL HIGH (ref 11.2–14.5)
WBC: 7.1 10*3/uL (ref 3.9–10.3)

## 2014-07-12 LAB — IRON AND TIBC CHCC
%SAT: 27 % (ref 21–57)
IRON: 70 ug/dL (ref 41–142)
TIBC: 256 ug/dL (ref 236–444)
UIBC: 186 ug/dL (ref 120–384)

## 2014-07-12 LAB — FERRITIN CHCC: FERRITIN: 228 ng/mL (ref 9–269)

## 2014-07-16 ENCOUNTER — Other Ambulatory Visit: Payer: Self-pay | Admitting: Internal Medicine

## 2014-07-17 ENCOUNTER — Other Ambulatory Visit: Payer: Self-pay

## 2014-07-17 MED ORDER — CELECOXIB 200 MG PO CAPS: ORAL_CAPSULE | ORAL | Status: DC

## 2014-07-17 NOTE — Telephone Encounter (Signed)
Spoke with patient and advised rx ready for pick-up and it will be at the front desk.  

## 2014-07-17 NOTE — Telephone Encounter (Signed)
Pt request written rx for celebrex; pt will pick up on 07/18/14. Last seen annual exam 06/15/2014. Pt sends rx to [place] [facility].Please advise.

## 2014-07-18 ENCOUNTER — Telehealth: Payer: Self-pay | Admitting: Internal Medicine

## 2014-07-18 ENCOUNTER — Encounter: Payer: Self-pay | Admitting: Internal Medicine

## 2014-07-18 ENCOUNTER — Ambulatory Visit (HOSPITAL_BASED_OUTPATIENT_CLINIC_OR_DEPARTMENT_OTHER): Payer: Medicare PPO | Admitting: Internal Medicine

## 2014-07-18 VITALS — BP 161/70 | HR 99 | Temp 98.4°F | Resp 18 | Ht 64.0 in | Wt 209.6 lb

## 2014-07-18 DIAGNOSIS — R197 Diarrhea, unspecified: Secondary | ICD-10-CM

## 2014-07-18 DIAGNOSIS — D509 Iron deficiency anemia, unspecified: Secondary | ICD-10-CM | POA: Diagnosis not present

## 2014-07-18 NOTE — Progress Notes (Signed)
[facility] [phone]   [phone]  OFFICE PROGRESS NOTE  [name], MD [place] [address]  DIAGNOSIS: Iron deficiency anemia  PRIOR THERAPY: Status post Feraheme infusion 510 mg IV weekly 2 doses last dose was given 06/08/2014  CURRENT THERAPY: Integra plus 1 capsule by mouth daily.  INTERVAL HISTORY: [name] 74 y.o. female returns to the clinic today for follow-up visit accompanied by her son. The patient feels a little bit better after receiving the Feraheme infusion. She is also on Integra plus 1 capsule by mouth daily and tolerating it well but she has persistent diarrhea that was unrelated to the oral iron tablets and has been there before starting the treatment. She continues to have mild fatigue but no dizzy spells. She denied having any significant chest pain, shortness of breath, cough or hemoptysis. She had repeat CBC and iron study performed recently and she is here for evaluation and discussion of her lab results.  MEDICAL HISTORY: Past Medical History  Diagnosis Date  . Asthma   . Diabetes mellitus     type 2  . Hypertension   . Fibromyalgia   . Osteoarthritis   . Depression   . Anal fissure   . Arthritis   . Obesity   . GERD (gastroesophageal reflux disease)   . Allergic rhinitis     ALLERGIES:  is allergic to fluoxetine hcl; lisinopril; and sertraline hcl.  MEDICATIONS:  Current Outpatient Prescriptions  Medication Sig Dispense Refill  . aspirin 81 MG tablet Take 81 mg by mouth daily.      . celecoxib (CELEBREX) 200 MG capsule TAKE ONE CAPSULE BY MOUTH EVERY DAY 90 capsule 3  . doxazosin (CARDURA) 4 MG tablet TAKE 1/2 TABLET TWICE DAILY 90 tablet 3  . FeFum-FePoly-FA-B Cmp-C-Biot (INTEGRA PLUS) CAPS Take 1 capsule by mouth every morning. 30 capsule 2  . gabapentin (NEURONTIN) 100 MG capsule TAKE 1 CAPSULE TWICE DAILY 180 capsule 3  . GLIPIZIDE XL 5 MG 24 hr tablet TAKE 1  TABLET BY MOUTH DAILY WITH BREAKFAST. 90 tablet 3  . losartan-hydrochlorothiazide (HYZAAR) 100-25 MG per tablet TAKE 1 TABLET BY MOUTH DAILY. 90 tablet 3  . metFORMIN (GLUCOPHAGE) 1000 MG tablet TAKE 1 TABLET TWICE DAILY WITH MEAL 180 tablet 3  . omeprazole (PRILOSEC) 20 MG capsule Take 1 capsule (20 mg total) by mouth 2 (two) times daily before a meal. 180 capsule 3  . Potassium 75 MG TABS Take 1 tablet by mouth daily.      Marland Kitchen tiZANidine (ZANAFLEX) 4 MG tablet TAKE 1 TABLET AT BEDTIME 90 tablet 3  . Venlafaxine HCl 75 MG TB24 Take 1 tablet (75 mg total) by mouth daily. 90 each 3   No current facility-administered medications for this visit.    SURGICAL HISTORY:  Past Surgical History  Procedure Laterality Date  . Cataract extraction, bilateral    . Cholecystectomy  4/06  . Abdominal hysterectomy  1970's  . Tubal ligation  1970's  . Ventral hernia repair  9/08    [place]    REVIEW OF SYSTEMS:  A comprehensive review of systems was negative except for: Constitutional: positive for fatigue   PHYSICAL EXAMINATION: General appearance: alert, cooperative, fatigued and no distress Head: Normocephalic, without obvious abnormality, atraumatic Neck: no adenopathy, no JVD, supple, symmetrical, trachea midline and thyroid not enlarged, symmetric, no tenderness/mass/nodules Lymph nodes: Cervical, supraclavicular, and axillary nodes normal. Resp: clear to auscultation bilaterally Back:  symmetric, no curvature. ROM normal. No CVA tenderness. Cardio: regular rate and rhythm, S1, S2 normal, no murmur, click, rub or gallop GI: soft, non-tender; bowel sounds normal; no masses,  no organomegaly Extremities: extremities normal, atraumatic, no cyanosis or edema  ECOG PERFORMANCE STATUS: 1 - Symptomatic but completely ambulatory  Blood pressure 161/70, pulse 99, temperature 98.4 F (36.9 C), temperature source Oral, resp. rate 18, height 5\' 4"  (1.626 m), weight 209 lb 9.6 oz (95.074 kg), SpO2 100  %.  LABORATORY DATA: Lab Results  Component Value Date   WBC 7.1 07/12/2014   HGB 10.1* 07/12/2014   HCT 30.8* 07/12/2014   MCV 86.9 07/12/2014   PLT 212 07/12/2014      Chemistry      Component Value Date/Time   NA 137 05/31/2014 1123   NA 133* 04/24/2014 1349   K 5.4* 05/31/2014 1123   K 4.4 04/24/2014 1349   CL 99 04/24/2014 1349   CO2 22 05/31/2014 1123   CO2 25 04/24/2014 1349   BUN 27.5* 05/31/2014 1123   BUN 32* 04/24/2014 1349   CREATININE 1.2* 05/31/2014 1123   CREATININE 1.29* 04/24/2014 1349      Component Value Date/Time   CALCIUM 9.1 05/31/2014 1123   CALCIUM 9.0 04/24/2014 1349   ALKPHOS 77 05/31/2014 1123   ALKPHOS 66 04/24/2014 1349   AST 22 05/31/2014 1123   AST 32 04/24/2014 1349   ALT 23 05/31/2014 1123   ALT 24 04/24/2014 1349   BILITOT <0.20 05/31/2014 1123   BILITOT 0.4 04/24/2014 1349       RADIOGRAPHIC STUDIES: No results found.  ASSESSMENT AND PLAN: This is a very pleasant 75 years old white female with iron deficiency anemia most likely secondary to malabsorption was persistent diarrhea. She is status post Feraheme infusion with improvement in her hemoglobin and hematocrit as well as the iron study and ferritin level. She is also on oral iron tablets with Integra +1 capsule by mouth daily. I recommended for the patient to see her gastroenterologist for evaluation of the persistent diarrhea. She will continue on the oral iron tablets for now. I will see her back for follow-up visit in 2 months for reevaluation with repeat CBC, iron study and ferritin. She was advised to call immediately if she has any concerning symptoms in the interval. The patient voices understanding of current disease status and treatment options and is in agreement with the current care plan.  All questions were answered. The patient knows to call the clinic with any problems, questions or concerns. We can certainly see the patient much sooner if  necessary.  Disclaimer: This note was dictated with voice recognition software. Similar sounding words can inadvertently be transcribed and may not be corrected upon review.

## 2014-07-18 NOTE — Telephone Encounter (Signed)
Gave and printed appt sched and avs for pt for jUNE  °

## 2014-07-31 DIAGNOSIS — T8189XD Other complications of procedures, not elsewhere classified, subsequent encounter: Secondary | ICD-10-CM | POA: Diagnosis not present

## 2014-07-31 DIAGNOSIS — H018 Other specified inflammations of eyelid: Secondary | ICD-10-CM | POA: Diagnosis not present

## 2014-07-31 DIAGNOSIS — H0289 Other specified disorders of eyelid: Secondary | ICD-10-CM | POA: Diagnosis not present

## 2014-07-31 DIAGNOSIS — Y838 Other surgical procedures as the cause of abnormal reaction of the patient, or of later complication, without mention of misadventure at the time of the procedure: Secondary | ICD-10-CM | POA: Diagnosis not present

## 2014-08-20 ENCOUNTER — Other Ambulatory Visit: Payer: Self-pay | Admitting: Internal Medicine

## 2014-08-23 DIAGNOSIS — Z1231 Encounter for screening mammogram for malignant neoplasm of breast: Secondary | ICD-10-CM | POA: Diagnosis not present

## 2014-08-24 ENCOUNTER — Encounter: Payer: Self-pay | Admitting: Internal Medicine

## 2014-08-24 LAB — HM MAMMOGRAPHY: HM Mammogram: NORMAL

## 2014-09-14 ENCOUNTER — Other Ambulatory Visit (HOSPITAL_BASED_OUTPATIENT_CLINIC_OR_DEPARTMENT_OTHER): Payer: Medicare PPO

## 2014-09-14 DIAGNOSIS — D509 Iron deficiency anemia, unspecified: Secondary | ICD-10-CM

## 2014-09-14 LAB — CBC WITH DIFFERENTIAL/PLATELET
BASO%: 0.5 % (ref 0.0–2.0)
Basophils Absolute: 0 10*3/uL (ref 0.0–0.1)
EOS ABS: 0.1 10*3/uL (ref 0.0–0.5)
EOS%: 1.6 % (ref 0.0–7.0)
HEMATOCRIT: 32.2 % — AB (ref 34.8–46.6)
HGB: 10.8 g/dL — ABNORMAL LOW (ref 11.6–15.9)
LYMPH%: 23.1 % (ref 14.0–49.7)
MCH: 30.7 pg (ref 25.1–34.0)
MCHC: 33.5 g/dL (ref 31.5–36.0)
MCV: 91.7 fL (ref 79.5–101.0)
MONO#: 0.5 10*3/uL (ref 0.1–0.9)
MONO%: 5.6 % (ref 0.0–14.0)
NEUT%: 69.2 % (ref 38.4–76.8)
NEUTROS ABS: 5.6 10*3/uL (ref 1.5–6.5)
Platelets: 216 10*3/uL (ref 145–400)
RBC: 3.52 10*6/uL — ABNORMAL LOW (ref 3.70–5.45)
RDW: 14.2 % (ref 11.2–14.5)
WBC: 8.1 10*3/uL (ref 3.9–10.3)
lymph#: 1.9 10*3/uL (ref 0.9–3.3)

## 2014-09-14 LAB — IRON AND TIBC CHCC
%SAT: 18 % — AB (ref 21–57)
Iron: 56 ug/dL (ref 41–142)
TIBC: 315 ug/dL (ref 236–444)
UIBC: 259 ug/dL (ref 120–384)

## 2014-09-14 LAB — FERRITIN CHCC: Ferritin: 76 ng/ml (ref 9–269)

## 2014-09-19 ENCOUNTER — Encounter: Payer: Self-pay | Admitting: Internal Medicine

## 2014-09-19 ENCOUNTER — Ambulatory Visit (HOSPITAL_BASED_OUTPATIENT_CLINIC_OR_DEPARTMENT_OTHER): Payer: Medicare PPO | Admitting: Internal Medicine

## 2014-09-19 ENCOUNTER — Telehealth: Payer: Self-pay | Admitting: Internal Medicine

## 2014-09-19 VITALS — BP 162/81 | HR 87 | Temp 98.3°F | Resp 17 | Ht 64.0 in | Wt 206.2 lb

## 2014-09-19 DIAGNOSIS — D539 Nutritional anemia, unspecified: Secondary | ICD-10-CM

## 2014-09-19 DIAGNOSIS — D509 Iron deficiency anemia, unspecified: Secondary | ICD-10-CM | POA: Diagnosis not present

## 2014-09-19 DIAGNOSIS — R197 Diarrhea, unspecified: Secondary | ICD-10-CM

## 2014-09-19 MED ORDER — INTEGRA PLUS PO CAPS: 1.0000 | ORAL_CAPSULE | Freq: Every morning | ORAL | Status: DC

## 2014-09-19 NOTE — Progress Notes (Signed)
[facility] [phone]   [phone]  OFFICE PROGRESS NOTE  Anna Simpler, MD [place] [address]  DIAGNOSIS: Iron deficiency anemia  PRIOR THERAPY: Status post Feraheme infusion 510 mg IV weekly 2 doses last dose was given 06/08/2014  CURRENT THERAPY: Integra plus 1 capsule by mouth daily.  INTERVAL HISTORY: [name] 74 y.o. female returns to the clinic today for follow-up visit accompanied by her daughter. The patient is feeling fine today with no specific complaints. She denied having any significant fatigue or weakness. She denied having any dizzy spells. She is currently on Integra plus 1 capsule by mouth daily and tolerating it well except for few episodes of intermittent diarrhea. She denied having any significant chest pain, shortness of breath, cough or hemoptysis. She had repeat CBC and iron study performed recently and she is here for evaluation and discussion of her lab results.  MEDICAL HISTORY: Past Medical History  Diagnosis Date  . Asthma   . Diabetes mellitus     type 2  . Hypertension   . Fibromyalgia   . Osteoarthritis   . Depression   . Anal fissure   . Arthritis   . Obesity   . GERD (gastroesophageal reflux disease)   . Allergic rhinitis     ALLERGIES:  is allergic to fluoxetine hcl; lisinopril; and sertraline hcl.  MEDICATIONS:  Current Outpatient Prescriptions  Medication Sig Dispense Refill  . aspirin 81 MG tablet Take 81 mg by mouth daily.      . bacitracin ophthalmic ointment Place on sutures 2 times per day.    . celecoxib (CELEBREX) 200 MG capsule TAKE ONE CAPSULE BY MOUTH EVERY DAY 90 capsule 3  . doxazosin (CARDURA) 4 MG tablet TAKE 1/2 TABLET TWICE DAILY 90 tablet 3  . FeFum-FePoly-FA-B Cmp-C-Biot (INTEGRA PLUS) CAPS Take 1 capsule by mouth every morning. 30 capsule 2  . gabapentin (NEURONTIN) 100 MG capsule TAKE 1 CAPSULE TWICE DAILY 180 capsule 3  . GLIPIZIDE XL 5  MG 24 hr tablet TAKE 1 TABLET BY MOUTH DAILY WITH BREAKFAST. 90 tablet 3  . losartan-hydrochlorothiazide (HYZAAR) 100-25 MG per tablet TAKE 1 TABLET BY MOUTH DAILY. 90 tablet 3  . metFORMIN (GLUCOPHAGE) 1000 MG tablet TAKE 1 TABLET TWICE DAILY WITH MEAL 180 tablet 3  . Potassium 75 MG TABS Take 1 tablet by mouth daily.      Marland Kitchen tiZANidine (ZANAFLEX) 4 MG tablet TAKE 1 TABLET AT BEDTIME 90 tablet 3  . Venlafaxine HCl 75 MG TB24 Take 1 tablet (75 mg total) by mouth daily. 90 each 3   No current facility-administered medications for this visit.    SURGICAL HISTORY:  Past Surgical History  Procedure Laterality Date  . Cataract extraction, bilateral    . Cholecystectomy  4/06  . Abdominal hysterectomy  1970's  . Tubal ligation  1970's  . Ventral hernia repair  9/08    [place]    REVIEW OF SYSTEMS:  A comprehensive review of systems was negative except for: Gastrointestinal: positive for diarrhea   PHYSICAL EXAMINATION: General appearance: alert, cooperative, fatigued and no distress Head: Normocephalic, without obvious abnormality, atraumatic Neck: no adenopathy, no JVD, supple, symmetrical, trachea midline and thyroid not enlarged, symmetric, no tenderness/mass/nodules Lymph nodes: Cervical, supraclavicular, and axillary nodes normal. Resp: clear to auscultation bilaterally Back: symmetric, no curvature. ROM normal. No CVA tenderness. Cardio: regular rate and rhythm, S1, S2 normal, no murmur, click, rub or gallop GI:  soft, non-tender; bowel sounds normal; no masses,  no organomegaly Extremities: extremities normal, atraumatic, no cyanosis or edema  ECOG PERFORMANCE STATUS: 1 - Symptomatic but completely ambulatory  Blood pressure 162/81, pulse 87, temperature 98.3 F (36.8 C), temperature source Oral, resp. rate 17, height 5\' 4"  (1.626 m), weight 206 lb 3.2 oz (93.532 kg), SpO2 100 %.  LABORATORY DATA: Lab Results  Component Value Date   WBC 8.1 09/14/2014   HGB 10.8* 09/14/2014     HCT 32.2* 09/14/2014   MCV 91.7 09/14/2014   PLT 216 09/14/2014      Chemistry      Component Value Date/Time   NA 137 05/31/2014 1123   NA 133* 04/24/2014 1349   K 5.4* 05/31/2014 1123   K 4.4 04/24/2014 1349   CL 99 04/24/2014 1349   CO2 22 05/31/2014 1123   CO2 25 04/24/2014 1349   BUN 27.5* 05/31/2014 1123   BUN 32* 04/24/2014 1349   CREATININE 1.2* 05/31/2014 1123   CREATININE 1.29* 04/24/2014 1349      Component Value Date/Time   CALCIUM 9.1 05/31/2014 1123   CALCIUM 9.0 04/24/2014 1349   ALKPHOS 77 05/31/2014 1123   ALKPHOS 66 04/24/2014 1349   AST 22 05/31/2014 1123   AST 32 04/24/2014 1349   ALT 23 05/31/2014 1123   ALT 24 04/24/2014 1349   BILITOT <0.20 05/31/2014 1123   BILITOT 0.4 04/24/2014 1349       RADIOGRAPHIC STUDIES: No results found.  ASSESSMENT AND PLAN: This is a very pleasant 74 years old white female with iron deficiency anemia most likely secondary to malabsorption was persistent diarrhea. She is currently on Integra plus with further improvement in her hemoglobin and hematocrit and a stable iron study and ferritin level. I recommended for the patient to continue on the oral iron tablets for now. I will see her back for follow-up visit in 3 months for reevaluation with repeat CBC, iron study and ferritin. She was advised to call immediately if she has any concerning symptoms in the interval. The patient voices understanding of current disease status and treatment options and is in agreement with the current care plan.  All questions were answered. The patient knows to call the clinic with any problems, questions or concerns. We can certainly see the patient much sooner if necessary.  Disclaimer: This note was dictated with voice recognition software. Similar sounding words can inadvertently be transcribed and may not be corrected upon review.

## 2014-09-19 NOTE — Telephone Encounter (Signed)
per pof to sch pt appt-gave pt copy of avs °

## 2014-12-12 ENCOUNTER — Other Ambulatory Visit (HOSPITAL_BASED_OUTPATIENT_CLINIC_OR_DEPARTMENT_OTHER): Payer: Medicare PPO

## 2014-12-12 DIAGNOSIS — D509 Iron deficiency anemia, unspecified: Secondary | ICD-10-CM | POA: Diagnosis not present

## 2014-12-12 DIAGNOSIS — D539 Nutritional anemia, unspecified: Secondary | ICD-10-CM

## 2014-12-12 LAB — CBC WITH DIFFERENTIAL/PLATELET
BASO%: 0.1 % (ref 0.0–2.0)
Basophils Absolute: 0 10*3/uL (ref 0.0–0.1)
EOS%: 2.4 % (ref 0.0–7.0)
Eosinophils Absolute: 0.2 10*3/uL (ref 0.0–0.5)
HEMATOCRIT: 30.9 % — AB (ref 34.8–46.6)
HGB: 10 g/dL — ABNORMAL LOW (ref 11.6–15.9)
LYMPH#: 1.5 10*3/uL (ref 0.9–3.3)
LYMPH%: 22.6 % (ref 14.0–49.7)
MCH: 30.5 pg (ref 25.1–34.0)
MCHC: 32.4 g/dL (ref 31.5–36.0)
MCV: 94.2 fL (ref 79.5–101.0)
MONO#: 0.4 10*3/uL (ref 0.1–0.9)
MONO%: 6.4 % (ref 0.0–14.0)
NEUT#: 4.6 10*3/uL (ref 1.5–6.5)
NEUT%: 68.5 % (ref 38.4–76.8)
PLATELETS: 173 10*3/uL (ref 145–400)
RBC: 3.28 10*6/uL — ABNORMAL LOW (ref 3.70–5.45)
RDW: 14 % (ref 11.2–14.5)
WBC: 6.7 10*3/uL (ref 3.9–10.3)

## 2014-12-12 LAB — FERRITIN CHCC: Ferritin: 56 ng/ml (ref 9–269)

## 2014-12-12 LAB — IRON AND TIBC CHCC
%SAT: 23 % (ref 21–57)
Iron: 68 ug/dL (ref 41–142)
TIBC: 295 ug/dL (ref 236–444)
UIBC: 227 ug/dL (ref 120–384)

## 2014-12-19 ENCOUNTER — Telehealth: Payer: Self-pay | Admitting: Internal Medicine

## 2014-12-19 ENCOUNTER — Encounter: Payer: Self-pay | Admitting: Internal Medicine

## 2014-12-19 ENCOUNTER — Ambulatory Visit (HOSPITAL_BASED_OUTPATIENT_CLINIC_OR_DEPARTMENT_OTHER): Payer: Medicare PPO | Admitting: Internal Medicine

## 2014-12-19 VITALS — BP 160/88 | HR 108 | Temp 97.8°F | Resp 18 | Ht 64.0 in | Wt 204.1 lb

## 2014-12-19 DIAGNOSIS — D509 Iron deficiency anemia, unspecified: Secondary | ICD-10-CM | POA: Diagnosis not present

## 2014-12-19 DIAGNOSIS — R197 Diarrhea, unspecified: Secondary | ICD-10-CM

## 2014-12-19 DIAGNOSIS — D539 Nutritional anemia, unspecified: Secondary | ICD-10-CM

## 2014-12-19 DIAGNOSIS — R1013 Epigastric pain: Secondary | ICD-10-CM | POA: Diagnosis not present

## 2014-12-19 NOTE — Progress Notes (Signed)
[facility] Telephone:([phone]   [phone]) [phone]  OFFICE PROGRESS NOTE  [name], MD [name] [address]  DIAGNOSIS: Iron deficiency anemia  PRIOR THERAPY: Status post Feraheme infusion 510 mg IV weekly 2 doses last dose was given 06/08/2014  CURRENT THERAPY: Integra plus 1 capsule by mouth daily.  INTERVAL HISTORY: [name] 74 y.o. female returns to the clinic today for follow-up visit accompanied by her daughter. The patient is feeling fine today with no specific complaints except for occasional epigastric pain. She denied having any significant fatigue or weakness. She denied having any dizzy spells. She is currently on Integra plus 1 capsule by mouth daily and tolerating it well except for few episodes of intermittent diarrhea. She denied having any significant chest pain, shortness of breath, cough or hemoptysis. She had repeat CBC and iron study performed recently and she is here for evaluation and discussion of her lab results.  MEDICAL HISTORY: Past Medical History  Diagnosis Date  . Asthma   . Diabetes mellitus     type 2  . Hypertension   . Fibromyalgia   . Osteoarthritis   . Depression   . Anal fissure   . Arthritis   . Obesity   . GERD (gastroesophageal reflux disease)   . Allergic rhinitis     ALLERGIES:  is allergic to fluoxetine hcl; lisinopril; and sertraline hcl.  MEDICATIONS:  Current Outpatient Prescriptions  Medication Sig Dispense Refill  . aspirin 81 MG tablet Take 81 mg by mouth daily.      . bacitracin ophthalmic ointment Place on sutures 2 times per day.    . celecoxib (CELEBREX) 200 MG capsule TAKE ONE CAPSULE BY MOUTH EVERY DAY 90 capsule 3  . doxazosin (CARDURA) 4 MG tablet TAKE 1/2 TABLET TWICE DAILY 90 tablet 3  . FeFum-FePoly-FA-B Cmp-C-Biot (INTEGRA PLUS) CAPS Take 1 capsule by mouth every morning. 30 capsule 2  . gabapentin (NEURONTIN) 100 MG capsule TAKE 1 CAPSULE TWICE  DAILY 180 capsule 3  . GLIPIZIDE XL 5 MG 24 hr tablet TAKE 1 TABLET BY MOUTH DAILY WITH BREAKFAST. 90 tablet 3  . losartan-hydrochlorothiazide (HYZAAR) 100-25 MG per tablet TAKE 1 TABLET BY MOUTH DAILY. 90 tablet 3  . metFORMIN (GLUCOPHAGE) 1000 MG tablet TAKE 1 TABLET TWICE DAILY WITH MEAL 180 tablet 3  . Potassium 75 MG TABS Take 1 tablet by mouth daily.      Marland Kitchen tiZANidine (ZANAFLEX) 4 MG tablet TAKE 1 TABLET AT BEDTIME 90 tablet 3  . Venlafaxine HCl 75 MG TB24 Take 1 tablet (75 mg total) by mouth daily. 90 each 3   No current facility-administered medications for this visit.    SURGICAL HISTORY:  Past Surgical History  Procedure Laterality Date  . Cataract extraction, bilateral    . Cholecystectomy  4/06  . Abdominal hysterectomy  1970's  . Tubal ligation  1970's  . Ventral hernia repair  9/08    [place]    REVIEW OF SYSTEMS:  A comprehensive review of systems was negative except for: Gastrointestinal: positive for diarrhea   PHYSICAL EXAMINATION: General appearance: alert, cooperative, fatigued and no distress Head: Normocephalic, without obvious abnormality, atraumatic Neck: no adenopathy, no JVD, supple, symmetrical, trachea midline and thyroid not enlarged, symmetric, no tenderness/mass/nodules Lymph nodes: Cervical, supraclavicular, and axillary nodes normal. Resp: clear to auscultation bilaterally Back: symmetric, no curvature. ROM normal. No CVA tenderness. Cardio: regular rate and rhythm, S1, S2 normal, no murmur,  click, rub or gallop GI: soft, non-tender; bowel sounds normal; no masses,  no organomegaly Extremities: extremities normal, atraumatic, no cyanosis or edema  ECOG PERFORMANCE STATUS: 1 - Symptomatic but completely ambulatory  Blood pressure 160/88, pulse 108, temperature 97.8 F (36.6 C), temperature source Oral, resp. rate 18, height 5\' 4"  (1.626 m), weight 204 lb 1.6 oz (92.579 kg), SpO2 98 %.  LABORATORY DATA: Lab Results  Component Value Date   WBC  6.7 12/12/2014   HGB 10.0* 12/12/2014   HCT 30.9* 12/12/2014   MCV 94.2 12/12/2014   PLT 173 12/12/2014      Chemistry      Component Value Date/Time   NA 137 05/31/2014 1123   NA 133* 04/24/2014 1349   K 5.4* 05/31/2014 1123   K 4.4 04/24/2014 1349   CL 99 04/24/2014 1349   CO2 22 05/31/2014 1123   CO2 25 04/24/2014 1349   BUN 27.5* 05/31/2014 1123   BUN 32* 04/24/2014 1349   CREATININE 1.2* 05/31/2014 1123   CREATININE 1.29* 04/24/2014 1349      Component Value Date/Time   CALCIUM 9.1 05/31/2014 1123   CALCIUM 9.0 04/24/2014 1349   ALKPHOS 77 05/31/2014 1123   ALKPHOS 66 04/24/2014 1349   AST 22 05/31/2014 1123   AST 32 04/24/2014 1349   ALT 23 05/31/2014 1123   ALT 24 04/24/2014 1349   BILITOT <0.20 05/31/2014 1123   BILITOT 0.4 04/24/2014 1349       RADIOGRAPHIC STUDIES: No results found.  ASSESSMENT AND PLAN: This is a very pleasant 74 years old white female with iron deficiency anemia most likely secondary to malabsorption was persistent diarrhea. She is currently on Integra plus with stable hemoglobin and hematocrit and iron study and ferritin level. I recommended for the patient to continue on the oral iron tablets for now. I will see her back for follow-up visit in 6 months for reevaluation with repeat CBC, iron study and ferritin. She was advised to call immediately if she has any concerning symptoms in the interval. The patient voices understanding of current disease status and treatment options and is in agreement with the current care plan.  All questions were answered. The patient knows to call the clinic with any problems, questions or concerns. We can certainly see the patient much sooner if necessary.  Disclaimer: This note was dictated with voice recognition software. Similar sounding words can inadvertently be transcribed and may not be corrected upon review.

## 2014-12-19 NOTE — Telephone Encounter (Signed)
per pof to sch pt appt-gave pt copy of avs °

## 2015-01-02 ENCOUNTER — Other Ambulatory Visit: Payer: Self-pay | Admitting: Internal Medicine

## 2015-01-09 ENCOUNTER — Ambulatory Visit (INDEPENDENT_AMBULATORY_CARE_PROVIDER_SITE_OTHER): Payer: Medicare PPO | Admitting: Internal Medicine

## 2015-01-09 ENCOUNTER — Encounter: Payer: Self-pay | Admitting: Internal Medicine

## 2015-01-09 VITALS — BP 148/70 | HR 78 | Temp 97.5°F | Wt 204.0 lb

## 2015-01-09 DIAGNOSIS — Z23 Encounter for immunization: Secondary | ICD-10-CM | POA: Diagnosis not present

## 2015-01-09 DIAGNOSIS — E1140 Type 2 diabetes mellitus with diabetic neuropathy, unspecified: Secondary | ICD-10-CM

## 2015-01-09 DIAGNOSIS — I10 Essential (primary) hypertension: Secondary | ICD-10-CM | POA: Diagnosis not present

## 2015-01-09 DIAGNOSIS — M170 Bilateral primary osteoarthritis of knee: Secondary | ICD-10-CM

## 2015-01-09 DIAGNOSIS — D509 Iron deficiency anemia, unspecified: Secondary | ICD-10-CM | POA: Diagnosis not present

## 2015-01-09 DIAGNOSIS — E1165 Type 2 diabetes mellitus with hyperglycemia: Secondary | ICD-10-CM | POA: Diagnosis not present

## 2015-01-09 DIAGNOSIS — F39 Unspecified mood [affective] disorder: Secondary | ICD-10-CM | POA: Diagnosis not present

## 2015-01-09 DIAGNOSIS — IMO0002 Reserved for concepts with insufficient information to code with codable children: Secondary | ICD-10-CM

## 2015-01-09 LAB — RENAL FUNCTION PANEL
ALBUMIN: 3.8 g/dL (ref 3.5–5.2)
BUN: 26 mg/dL — AB (ref 6–23)
CALCIUM: 9.6 mg/dL (ref 8.4–10.5)
CHLORIDE: 102 meq/L (ref 96–112)
CO2: 26 meq/L (ref 19–32)
Creatinine, Ser: 1.08 mg/dL (ref 0.40–1.20)
GFR: 52.8 mL/min — ABNORMAL LOW (ref >60.00)
Glucose, Bld: 207 mg/dL — ABNORMAL HIGH (ref 70–99)
Phosphorus: 3.4 mg/dL (ref 2.3–4.6)
Potassium: 4.7 mEq/L (ref 3.5–5.1)
Sodium: 135 mEq/L (ref 135–145)

## 2015-01-09 LAB — HEMOGLOBIN A1C: Hgb A1c MFr Bld: 7.6 % — ABNORMAL HIGH (ref 4.6–6.5)

## 2015-01-09 MED ORDER — VENLAFAXINE HCL ER 75 MG PO TB24: 1.0000 | ORAL_TABLET | Freq: Every day | ORAL | Status: DC

## 2015-01-09 MED ORDER — CELECOXIB 200 MG PO CAPS: ORAL_CAPSULE | ORAL | Status: DC

## 2015-01-09 NOTE — Progress Notes (Signed)
Subjective:    Patient ID: [name], female    DOB: 11-Jul-1940, 74 y.o.   MRN: [id number]  HPI Here for follow up of diabetes and other medical conditions  Anemia is much better Can feel more energy Now just on oral iron Never went for GI evaluation No evidence of GI bleed  Trying to eat better Weight is down some Still not really active Not checking sugars Doesn't think she could do any injections--feels she needs other oral meds if still high  Mood has been okay No problems with her current meds  Still on celebrex Helps her knee pain somewhat--but still limited  Current Outpatient Prescriptions on File Prior to Visit  Medication Sig Dispense Refill  . aspirin 81 MG tablet Take 81 mg by mouth daily.      . celecoxib (CELEBREX) 200 MG capsule TAKE ONE CAPSULE BY MOUTH EVERY DAY 90 capsule 3  . doxazosin (CARDURA) 4 MG tablet TAKE 1/2 TABLET TWICE DAILY 90 tablet 3  . FeFum-FePoly-FA-B Cmp-C-Biot (INTEGRA PLUS) CAPS Take 1 capsule by mouth every morning. 30 capsule 2  . gabapentin (NEURONTIN) 100 MG capsule TAKE 1 CAPSULE TWICE DAILY 180 capsule 3  . GLIPIZIDE XL 5 MG 24 hr tablet TAKE 1 TABLET BY MOUTH DAILY WITH BREAKFAST. 90 tablet 3  . losartan-hydrochlorothiazide (HYZAAR) 100-25 MG per tablet TAKE 1 TABLET BY MOUTH DAILY. 90 tablet 3  . metFORMIN (GLUCOPHAGE) 1000 MG tablet TAKE 1 TABLET TWICE DAILY WITH MEAL 180 tablet 3  . Potassium 75 MG TABS Take 1 tablet by mouth daily.      Marland Kitchen tiZANidine (ZANAFLEX) 4 MG tablet TAKE 1 TABLET AT BEDTIME 90 tablet 3  . Venlafaxine HCl 75 MG TB24 Take 1 tablet (75 mg total) by mouth daily. 90 each 3   No current facility-administered medications on file prior to visit.    Allergies  Allergen Reactions  . Diphenhydramine Hcl Other (See Comments)    hallucinations   . Fluoxetine Hcl     REACTION: raised blood pressure  . Lisinopril     REACTION: dizzy  . Sertraline Hcl     REACTION: dizziness    Past Medical History    Diagnosis Date  . Asthma   . Diabetes mellitus     type 2  . Hypertension   . Fibromyalgia   . Osteoarthritis   . Depression   . Anal fissure   . Arthritis   . Obesity   . GERD (gastroesophageal reflux disease)   . Allergic rhinitis     Past Surgical History  Procedure Laterality Date  . Cataract extraction, bilateral    . Cholecystectomy  4/06  . Abdominal hysterectomy  1970's  . Tubal ligation  1970's  . Ventral hernia repair  9/08    [place]    Family History  Problem Relation Age of Onset  . Heart attack Father   . Kidney cancer Mother   . Lung cancer Brother   . Liver cancer Brother   . Coronary artery disease Father   . Hypertension Neg Hx   . Diabetes Other     Maternal side  . Breast cancer Neg Hx   . Colon cancer Neg Hx     Social History   Social History  . Marital Status: Divorced    Spouse Name: N/A  . Number of Children: 3  . Years of Education: N/A   Occupational History  . Homemaker    Social History Main Topics  .  Smoking status: Never Smoker   . Smokeless tobacco: Never Used  . Alcohol Use: No  . Drug Use: No  . Sexual Activity: Not on file   Other Topics Concern  . Not on file   Social History Narrative   No living will   No health care POA but would want son, [name], to make decisions about her care if needed   Would accept resuscitation attempts   Probably wouldn't want tube feeds if cognitively unaware   Review of Systems  No chest pain No SOB Chronic sleep problems -- only 3-4 hours a night     Objective:   Physical Exam  Constitutional: She appears well-developed and well-nourished. No distress.  Neck: Normal range of motion. No thyromegaly present.  Cardiovascular: Normal rate, regular rhythm, normal heart sounds and intact distal pulses.  Exam reveals no gallop.   No murmur heard. Pulmonary/Chest: Effort normal and breath sounds normal. No respiratory distress. She has no wheezes. She has no rales.   Musculoskeletal: She exhibits no edema.  Lymphadenopathy:    She has no cervical adenopathy.  Psychiatric: She has a normal mood and affect. Her behavior is normal.          Assessment & Plan:

## 2015-01-09 NOTE — Addendum Note (Signed)
Addended by: Despina Hidden on: 01/09/2015 01:18 PM   Modules accepted: Orders

## 2015-01-09 NOTE — Assessment & Plan Note (Signed)
Better but still anemic On oral therapy now

## 2015-01-09 NOTE — Assessment & Plan Note (Signed)
Mood is better Will continue the meds

## 2015-01-09 NOTE — Assessment & Plan Note (Signed)
BP Readings from Last 3 Encounters:  01/09/15 148/70  12/19/14 160/88  09/19/14 162/81   Reasonable control

## 2015-01-09 NOTE — Assessment & Plan Note (Signed)
Not really working hard on lifestyle--discussed Will plan to add januvia--she doesn't want injections or endocrine Hope to get her closer to 9%

## 2015-01-09 NOTE — Assessment & Plan Note (Signed)
On celebrex Will recheck renal function

## 2015-01-09 NOTE — Progress Notes (Signed)
Pre visit review using our clinic review tool, if applicable. No additional management support is needed unless otherwise documented below in the visit note. 

## 2015-01-11 ENCOUNTER — Other Ambulatory Visit: Payer: Self-pay

## 2015-01-11 MED ORDER — TIZANIDINE HCL 4 MG PO TABS: 4.0000 mg | ORAL_TABLET | Freq: Every day | ORAL | Status: DC

## 2015-01-11 MED ORDER — GLIPIZIDE ER 5 MG PO TB24: ORAL_TABLET | ORAL | Status: DC

## 2015-01-11 MED ORDER — METFORMIN HCL 1000 MG PO TABS: ORAL_TABLET | ORAL | Status: DC

## 2015-01-11 NOTE — Telephone Encounter (Signed)
Approved:#90 x 3 

## 2015-01-11 NOTE — Telephone Encounter (Signed)
[facility] pharmacy left v/m requesting refill glipizide,(done) metformin(done) and tizanidine. Last f/u appt 01/09/15.Please advise.

## 2015-01-23 ENCOUNTER — Encounter: Payer: Self-pay | Admitting: Internal Medicine

## 2015-01-23 ENCOUNTER — Ambulatory Visit (INDEPENDENT_AMBULATORY_CARE_PROVIDER_SITE_OTHER): Payer: Medicare PPO | Admitting: Internal Medicine

## 2015-01-23 VITALS — BP 140/80 | HR 98 | Temp 98.4°F | Wt 207.0 lb

## 2015-01-23 DIAGNOSIS — M1712 Unilateral primary osteoarthritis, left knee: Secondary | ICD-10-CM | POA: Diagnosis not present

## 2015-01-23 NOTE — Assessment & Plan Note (Signed)
PROCEDURE Sterile prep to left knee 2cc 2% lidocaine then ethyl chloride 40mg  depomedrol and 6cc 2% lido instilled without difficulty Tolerated well Home care instructions given

## 2015-01-23 NOTE — Progress Notes (Signed)
Pre visit review using our clinic review tool, if applicable. No additional management support is needed unless otherwise documented below in the visit note. 

## 2015-01-23 NOTE — Progress Notes (Signed)
   Subjective:    Patient ID: [name], female    DOB: 28-Nov-1940, 74 y.o.   MRN: [id number]  HPI Here for left knee injection Ongoing pain   Review of Systems     Objective:   Physical Exam        Assessment & Plan:

## 2015-02-16 ENCOUNTER — Other Ambulatory Visit: Payer: Self-pay | Admitting: Internal Medicine

## 2015-02-25 ENCOUNTER — Other Ambulatory Visit: Payer: Self-pay

## 2015-02-25 MED ORDER — LOSARTAN POTASSIUM-HCTZ 100-25 MG PO TABS: 1.0000 | ORAL_TABLET | Freq: Every day | ORAL | Status: DC

## 2015-02-25 NOTE — Telephone Encounter (Signed)
Pt request refill losartan HCTZ to [facility]. Pt last seen 01/09/15. Advised pt done.

## 2015-06-11 ENCOUNTER — Other Ambulatory Visit (HOSPITAL_BASED_OUTPATIENT_CLINIC_OR_DEPARTMENT_OTHER): Payer: Medicare PPO

## 2015-06-11 DIAGNOSIS — D509 Iron deficiency anemia, unspecified: Secondary | ICD-10-CM | POA: Diagnosis not present

## 2015-06-11 DIAGNOSIS — D539 Nutritional anemia, unspecified: Secondary | ICD-10-CM

## 2015-06-11 LAB — CBC WITH DIFFERENTIAL/PLATELET
BASO%: 0.5 % (ref 0.0–2.0)
BASOS ABS: 0 10*3/uL (ref 0.0–0.1)
EOS ABS: 0.2 10*3/uL (ref 0.0–0.5)
EOS%: 2.4 % (ref 0.0–7.0)
HEMATOCRIT: 30.9 % — AB (ref 34.8–46.6)
HEMOGLOBIN: 10.1 g/dL — AB (ref 11.6–15.9)
LYMPH#: 1.4 10*3/uL (ref 0.9–3.3)
LYMPH%: 21.1 % (ref 14.0–49.7)
MCH: 30.1 pg (ref 25.1–34.0)
MCHC: 32.7 g/dL (ref 31.5–36.0)
MCV: 91.9 fL (ref 79.5–101.0)
MONO#: 0.5 10*3/uL (ref 0.1–0.9)
MONO%: 7.5 % (ref 0.0–14.0)
NEUT#: 4.5 10*3/uL (ref 1.5–6.5)
NEUT%: 68.5 % (ref 38.4–76.8)
Platelets: 189 10*3/uL (ref 145–400)
RBC: 3.36 10*6/uL — ABNORMAL LOW (ref 3.70–5.45)
RDW: 14 % (ref 11.2–14.5)
WBC: 6.5 10*3/uL (ref 3.9–10.3)

## 2015-06-11 LAB — FERRITIN: Ferritin: 77 ng/ml (ref 9–269)

## 2015-06-11 LAB — IRON AND TIBC
%SAT: 21 % (ref 21–57)
IRON: 61 ug/dL (ref 41–142)
TIBC: 296 ug/dL (ref 236–444)
UIBC: 235 ug/dL (ref 120–384)

## 2015-06-13 ENCOUNTER — Other Ambulatory Visit: Payer: Self-pay | Admitting: Internal Medicine

## 2015-06-18 ENCOUNTER — Encounter: Payer: Self-pay | Admitting: Internal Medicine

## 2015-06-18 ENCOUNTER — Telehealth: Payer: Self-pay | Admitting: Internal Medicine

## 2015-06-18 ENCOUNTER — Ambulatory Visit (HOSPITAL_BASED_OUTPATIENT_CLINIC_OR_DEPARTMENT_OTHER): Payer: Medicare PPO | Admitting: Internal Medicine

## 2015-06-18 VITALS — BP 160/80 | HR 87 | Temp 97.9°F | Resp 18 | Ht 64.0 in | Wt 210.2 lb

## 2015-06-18 DIAGNOSIS — R5383 Other fatigue: Secondary | ICD-10-CM | POA: Diagnosis not present

## 2015-06-18 DIAGNOSIS — D509 Iron deficiency anemia, unspecified: Secondary | ICD-10-CM

## 2015-06-18 DIAGNOSIS — R1012 Left upper quadrant pain: Secondary | ICD-10-CM | POA: Diagnosis not present

## 2015-06-18 DIAGNOSIS — D539 Nutritional anemia, unspecified: Secondary | ICD-10-CM

## 2015-06-18 DIAGNOSIS — R1032 Left lower quadrant pain: Secondary | ICD-10-CM | POA: Diagnosis not present

## 2015-06-18 NOTE — Telephone Encounter (Signed)
Gave and printed appt sched and avs fo rpt; for NOV  °

## 2015-06-18 NOTE — Progress Notes (Signed)
[facility] Telephone:(336) [phone]   Fax:(336) [phone]  OFFICE PROGRESS NOTE  [name], MD [place] [address]  DIAGNOSIS: Iron deficiency anemia  PRIOR THERAPY: Status post Feraheme infusion 510 mg IV weekly 2 doses last dose was given 06/08/2014  CURRENT THERAPY: Integra plus 1 capsule by mouth daily.  INTERVAL HISTORY: [name] 75 y.o. female returns to the clinic today for follow-up visit accompanied by her daughter. The patient is feeling fine today with no specific complaints except for persistent mild fatigue and occasional pain in the left upper and lower quadrant of the abdomen. She denied having any significant fatigue or weakness. She denied having any dizzy spells. She is currently on Integra plus 1 capsule by mouth daily and tolerating it well. She denied having any significant chest pain, shortness of breath, cough or hemoptysis. She had repeat CBC and iron study and ferritin performed recently and she is here for evaluation and discussion of her lab results.  MEDICAL HISTORY: Past Medical History  Diagnosis Date  . Asthma   . Diabetes mellitus     type 2  . Hypertension   . Fibromyalgia   . Osteoarthritis   . Depression   . Anal fissure   . Arthritis   . Obesity   . GERD (gastroesophageal reflux disease)   . Allergic rhinitis     ALLERGIES:  is allergic to diphenhydramine hcl; fluoxetine hcl; lisinopril; and sertraline hcl.  MEDICATIONS:  Current Outpatient Prescriptions  Medication Sig Dispense Refill  . aspirin 81 MG tablet Take 81 mg by mouth daily.      . celecoxib (CELEBREX) 200 MG capsule TAKE ONE CAPSULE BY MOUTH EVERY DAY 90 capsule 3  . doxazosin (CARDURA) 4 MG tablet TAKE 1/2 TABLET TWICE DAILY 90 tablet 3  . FeFum-FePoly-FA-B Cmp-C-Biot (INTEGRA PLUS) CAPS Take 1 capsule by mouth every morning. 30 capsule 2  . gabapentin (NEURONTIN) 100 MG capsule TAKE 1 CAPSULE TWICE DAILY 180 capsule 3   . glipiZIDE (GLUCOTROL XL) 5 MG 24 hr tablet TAKE 1 TABLET EVERY DAY WITH BREAKFAST 90 tablet 0  . losartan-hydrochlorothiazide (HYZAAR) 100-25 MG tablet Take 1 tablet by mouth daily. 90 tablet 1  . metFORMIN (GLUCOPHAGE) 1000 MG tablet TAKE 1 TABLET TWICE DAILY WITH MEAL 180 tablet 0  . Potassium 75 MG TABS Take 1 tablet by mouth daily.      Marland Kitchen tiZANidine (ZANAFLEX) 4 MG tablet Take 1 tablet (4 mg total) by mouth at bedtime. 90 tablet 3  . Venlafaxine HCl 75 MG TB24 Take 1 tablet (75 mg total) by mouth daily. 90 each 3   No current facility-administered medications for this visit.    SURGICAL HISTORY:  Past Surgical History  Procedure Laterality Date  . Cataract extraction, bilateral    . Cholecystectomy  4/06  . Abdominal hysterectomy  1970's  . Tubal ligation  1970's  . Ventral hernia repair  9/08    [facility]    REVIEW OF SYSTEMS:  A comprehensive review of systems was negative except for: Constitutional: positive for fatigue Gastrointestinal: positive for abdominal pain   PHYSICAL EXAMINATION: General appearance: alert, cooperative, fatigued and no distress Head: Normocephalic, without obvious abnormality, atraumatic Neck: no adenopathy, no JVD, supple, symmetrical, trachea midline and thyroid not enlarged, symmetric, no tenderness/mass/nodules Lymph nodes: Cervical, supraclavicular, and axillary nodes normal. Resp: clear to auscultation bilaterally Back: symmetric, no curvature. ROM normal. No CVA tenderness. Cardio: regular rate and rhythm,  S1, S2 normal, no murmur, click, rub or gallop GI: soft, non-tender; bowel sounds normal; no masses,  no organomegaly Extremities: extremities normal, atraumatic, no cyanosis or edema  ECOG PERFORMANCE STATUS: 1 - Symptomatic but completely ambulatory  There were no vitals taken for this visit.  LABORATORY DATA: Lab Results  Component Value Date   WBC 6.5 06/11/2015   HGB 10.1* 06/11/2015   HCT 30.9* 06/11/2015   MCV 91.9  06/11/2015   PLT 189 06/11/2015      Chemistry      Component Value Date/Time   NA 135 01/09/2015 1151   NA 137 05/31/2014 1123   K 4.7 01/09/2015 1151   K 5.4* 05/31/2014 1123   CL 102 01/09/2015 1151   CO2 26 01/09/2015 1151   CO2 22 05/31/2014 1123   BUN 26* 01/09/2015 1151   BUN 27.5* 05/31/2014 1123   CREATININE 1.08 01/09/2015 1151   CREATININE 1.2* 05/31/2014 1123      Component Value Date/Time   CALCIUM 9.6 01/09/2015 1151   CALCIUM 9.1 05/31/2014 1123   ALKPHOS 77 05/31/2014 1123   ALKPHOS 66 04/24/2014 1349   AST 22 05/31/2014 1123   AST 32 04/24/2014 1349   ALT 23 05/31/2014 1123   ALT 24 04/24/2014 1349   BILITOT <0.20 05/31/2014 1123   BILITOT 0.4 04/24/2014 1349       RADIOGRAPHIC STUDIES: No results found.  ASSESSMENT AND PLAN: This is a very pleasant 75 years old white female with iron deficiency anemia most likely secondary to malabsorption. She is currently on Integra plusAnd tolerating it well. She continues to have mild anemia with stable hemoglobin and hematocrit and iron study and ferritin level. I recommended for the patient to continue on the oral iron tablets for now. I will see her back for follow-up visit in 6 months for reevaluation with repeat CBC, iron study and ferritin. She was advised to call immediately if she has any concerning symptoms in the interval. The patient voices understanding of current disease status and treatment options and is in agreement with the current care plan.  All questions were answered. The patient knows to call the clinic with any problems, questions or concerns. We can certainly see the patient much sooner if necessary.  Disclaimer: This note was dictated with voice recognition software. Similar sounding words can inadvertently be transcribed and may not be corrected upon review.

## 2015-07-12 ENCOUNTER — Ambulatory Visit (INDEPENDENT_AMBULATORY_CARE_PROVIDER_SITE_OTHER): Payer: Medicare PPO | Admitting: Internal Medicine

## 2015-07-12 ENCOUNTER — Encounter: Payer: Self-pay | Admitting: Internal Medicine

## 2015-07-12 VITALS — BP 122/88 | HR 65 | Temp 98.0°F | Ht 63.75 in | Wt 208.0 lb

## 2015-07-12 DIAGNOSIS — E1140 Type 2 diabetes mellitus with diabetic neuropathy, unspecified: Secondary | ICD-10-CM

## 2015-07-12 DIAGNOSIS — IMO0002 Reserved for concepts with insufficient information to code with codable children: Secondary | ICD-10-CM

## 2015-07-12 DIAGNOSIS — Z0000 Encounter for general adult medical examination without abnormal findings: Secondary | ICD-10-CM

## 2015-07-12 DIAGNOSIS — Z23 Encounter for immunization: Secondary | ICD-10-CM | POA: Diagnosis not present

## 2015-07-12 DIAGNOSIS — E1165 Type 2 diabetes mellitus with hyperglycemia: Secondary | ICD-10-CM

## 2015-07-12 DIAGNOSIS — I10 Essential (primary) hypertension: Secondary | ICD-10-CM

## 2015-07-12 DIAGNOSIS — M79641 Pain in right hand: Secondary | ICD-10-CM

## 2015-07-12 DIAGNOSIS — Z7189 Other specified counseling: Secondary | ICD-10-CM

## 2015-07-12 DIAGNOSIS — F39 Unspecified mood [affective] disorder: Secondary | ICD-10-CM

## 2015-07-12 DIAGNOSIS — M170 Bilateral primary osteoarthritis of knee: Secondary | ICD-10-CM | POA: Diagnosis not present

## 2015-07-12 LAB — LIPID PANEL
CHOL/HDL RATIO: 3
Cholesterol: 142 mg/dL (ref 0–200)
HDL: 46.1 mg/dL (ref >39.00)
LDL Cholesterol: 68 mg/dL (ref 0–99)
NONHDL: 95.63
Triglycerides: 137 mg/dL (ref 0.0–149.0)
VLDL: 27.4 mg/dL (ref 0.0–40.0)

## 2015-07-12 LAB — HM DIABETES FOOT EXAM

## 2015-07-12 LAB — COMPREHENSIVE METABOLIC PANEL
ALBUMIN: 3.9 g/dL (ref 3.5–5.2)
ALT: 26 U/L (ref 0–35)
AST: 28 U/L (ref 0–37)
Alkaline Phosphatase: 67 U/L (ref 39–117)
BUN: 25 mg/dL — ABNORMAL HIGH (ref 6–23)
CALCIUM: 9.4 mg/dL (ref 8.4–10.5)
CHLORIDE: 102 meq/L (ref 96–112)
CO2: 24 meq/L (ref 19–32)
Creatinine, Ser: 1.01 mg/dL (ref 0.40–1.20)
GFR: 56.97 mL/min — ABNORMAL LOW (ref >60.00)
Glucose, Bld: 184 mg/dL — ABNORMAL HIGH (ref 70–99)
POTASSIUM: 4.4 meq/L (ref 3.5–5.1)
SODIUM: 135 meq/L (ref 135–145)
Total Bilirubin: 0.3 mg/dL (ref 0.2–1.2)
Total Protein: 7.7 g/dL (ref 6.0–8.3)

## 2015-07-12 LAB — T4, FREE: FREE T4: 0.81 ng/dL (ref 0.60–1.60)

## 2015-07-12 LAB — HEMOGLOBIN A1C: HEMOGLOBIN A1C: 8.3 % — AB (ref 4.6–6.5)

## 2015-07-12 MED ORDER — TRAMADOL HCL 50 MG PO TABS: 50.0000 mg | ORAL_TABLET | Freq: Three times a day (TID) | ORAL | Status: DC | PRN

## 2015-07-12 MED ORDER — CELECOXIB 200 MG PO CAPS: ORAL_CAPSULE | ORAL | Status: DC

## 2015-07-12 MED ORDER — VENLAFAXINE HCL ER 75 MG PO TB24: 1.0000 | ORAL_TABLET | Freq: Every day | ORAL | Status: DC

## 2015-07-12 MED ORDER — TETANUS-DIPHTHERIA TOXOIDS TD 5-2 LFU IM INJ: 0.5000 mL | INJECTION | Freq: Once | INTRAMUSCULAR | Status: DC

## 2015-07-12 NOTE — Patient Instructions (Signed)
Go to the pharmacy to get your tetanus booster.

## 2015-07-12 NOTE — Assessment & Plan Note (Signed)
Hopefully still acceptable Will recheck

## 2015-07-12 NOTE — Assessment & Plan Note (Signed)
Doing well on low dose venlafaxine

## 2015-07-12 NOTE — Assessment & Plan Note (Signed)
BP Readings from Last 3 Encounters:  07/12/15 122/88  06/18/15 160/80  01/23/15 140/80   Good control No change needed

## 2015-07-12 NOTE — Progress Notes (Signed)
Subjective:    Patient ID: [name], female    DOB: 26-Sep-1940, 75 y.o.   MRN: [id number]  HPI Here for Medicare wellness visit and follow up of chronic health conditions Reviewed form and advanced directives Reviewed other doctors No alcohol or tobacco No exercise--just not willing Vision and hearing are okay Independent with instrumental ADLs No falls Mild memory issues---mostly names  Having problems with right arm and hand Often numb in AM--goes back 10 years new knot in wrist Contractures in fingers are old New bruise in wrist also Terrible pain in wrist after pop this AM No finger pain  Continues on the iron therapy Now seems to be stable Feels okay Not checking sugars No hypoglycemic reactions Feet feel fine--- no major numbness now Due for eye exam  Mood is fine  Continues on the venlafaxine No daily depression Not anhedonic No sig anxiety  No chest pain No SOB No dizziness or syncope No edema No palpitations  Current Outpatient Prescriptions on File Prior to Visit  Medication Sig Dispense Refill  . aspirin 81 MG tablet Take 81 mg by mouth daily.      . celecoxib (CELEBREX) 200 MG capsule TAKE ONE CAPSULE BY MOUTH EVERY DAY 90 capsule 3  . doxazosin (CARDURA) 4 MG tablet TAKE 1/2 TABLET TWICE DAILY 90 tablet 3  . FeFum-FePoly-FA-B Cmp-C-Biot (INTEGRA PLUS) CAPS Take 1 capsule by mouth every morning. 30 capsule 2  . gabapentin (NEURONTIN) 100 MG capsule TAKE 1 CAPSULE TWICE DAILY 180 capsule 3  . glipiZIDE (GLUCOTROL XL) 5 MG 24 hr tablet TAKE 1 TABLET EVERY DAY WITH BREAKFAST 90 tablet 0  . losartan-hydrochlorothiazide (HYZAAR) 100-25 MG tablet Take 1 tablet by mouth daily. 90 tablet 1  . metFORMIN (GLUCOPHAGE) 1000 MG tablet TAKE 1 TABLET TWICE DAILY WITH MEAL 180 tablet 0  . Potassium 75 MG TABS Take 1 tablet by mouth daily.      Marland Kitchen tiZANidine (ZANAFLEX) 4 MG tablet Take 1 tablet (4 mg total) by mouth at bedtime. 90 tablet 3  . Venlafaxine HCl  75 MG TB24 Take 1 tablet (75 mg total) by mouth daily. 90 each 3   No current facility-administered medications on file prior to visit.    Allergies  Allergen Reactions  . Diphenhydramine Hcl Other (See Comments)    hallucinations   . Fluoxetine Hcl     REACTION: raised blood pressure  . Lisinopril     REACTION: dizzy  . Sertraline Hcl     REACTION: dizziness    Past Medical History  Diagnosis Date  . Asthma   . Diabetes mellitus     type 2  . Hypertension   . Fibromyalgia   . Osteoarthritis   . Depression   . Anal fissure   . Arthritis   . Obesity   . GERD (gastroesophageal reflux disease)   . Allergic rhinitis     Past Surgical History  Procedure Laterality Date  . Cataract extraction, bilateral    . Cholecystectomy  4/06  . Abdominal hysterectomy  1970's  . Tubal ligation  1970's  . Ventral hernia repair  9/08    [facility]    Family History  Problem Relation Age of Onset  . Heart attack Father   . Kidney cancer Mother   . Lung cancer Brother   . Liver cancer Brother   . Coronary artery disease Father   . Hypertension Neg Hx   . Diabetes Other     Maternal side  .  Breast cancer Neg Hx   . Colon cancer Neg Hx     Social History   Social History  . Marital Status: Divorced    Spouse Name: N/A  . Number of Children: 3  . Years of Education: N/A   Occupational History  . Homemaker    Social History Main Topics  . Smoking status: Never Smoker   . Smokeless tobacco: Never Used  . Alcohol Use: No  . Drug Use: No  . Sexual Activity: Not on file   Other Topics Concern  . Not on file   Social History Narrative   No living will   No health care POA but would want son, [name], to make decisions about her care if needed   Would accept resuscitation attempts   Probably wouldn't want tube feeds if cognitively unaware   Review of Systems Still has up to 3 loose stools in AM--then resolves. May depend on diet (discussed the metformin) Chronic  sleep issues --nothing new Appetite is good Weight down slightly Wears seat belt Teeth fine--mostly dentures (just few bottom ones) No blood in stool No urinary problems No rash or suspicious skin lesions (no change in seb keratoses)    Objective:   Physical Exam  Constitutional: She is oriented to person, place, and time. She appears well-developed and well-nourished. No distress.  HENT:  Mouth/Throat: Oropharynx is clear and moist. No oropharyngeal exudate.  Neck: Normal range of motion. Neck supple. No thyromegaly present.  Cardiovascular: Normal rate, regular rhythm, normal heart sounds and intact distal pulses.  Exam reveals no gallop.   No murmur heard. Pulmonary/Chest: Effort normal and breath sounds normal. No respiratory distress. She has no wheezes. She has no rales.  Abdominal: Soft. She exhibits no distension. There is no tenderness.  Musculoskeletal: She exhibits no edema.  Mild contracture at right CMC. Good grip strength Mild contusion at volar wrist No sig tenderness  Lymphadenopathy:    She has no cervical adenopathy.  Neurological: She is alert and oriented to person, place, and time.  President-- "Daisy Floro, Obama, Bush" 100-96--- math is not my thing D-[id number] Recall 3/3  Missed several of monofilament touches but got over half--on feet  Skin:  No foot lesions  Psychiatric: She has a normal mood and affect. Her behavior is normal.          Assessment & Plan:

## 2015-07-12 NOTE — Assessment & Plan Note (Signed)
Severe but just not ready to have TKR

## 2015-07-12 NOTE — Addendum Note (Signed)
Addended by: Pilar Grammes on: 07/12/2015 12:53 PM   Modules accepted: Orders

## 2015-07-12 NOTE — Assessment & Plan Note (Signed)
See social history Forms given again 

## 2015-07-12 NOTE — Assessment & Plan Note (Signed)
I have personally reviewed the Medicare Annual Wellness questionnaire and have noted 1. The patient's medical and social history 2. Their use of alcohol, tobacco or illicit drugs 3. Their current medications and supplements 4. The patient's functional ability including ADL's, fall risks, home safety risks and hearing or visual             impairment. 5. Diet and physical activities 6. Evidence for depression or mood disorders  The patients weight, height, BMI and visual acuity have been recorded in the chart I have made referrals, counseling and provided education to the patient based review of the above and I have provided the pt with a written personalized care plan for preventive services.  I have provided you with a copy of your personalized plan for preventive services. Please take the time to review along with your updated medication list.  Will update pneumovax Colon due 2019 mammo due next year Yearly flu vaccine

## 2015-07-12 NOTE — Progress Notes (Signed)
Pre visit review using our clinic review tool, if applicable. No additional management support is needed unless otherwise documented below in the visit note. 

## 2015-07-12 NOTE — Assessment & Plan Note (Signed)
Seems to be mostly CMC arthritis Will give a few tramadol (has done well with this in past) If worsens, to hand surgeon

## 2015-07-15 ENCOUNTER — Other Ambulatory Visit: Payer: Self-pay

## 2015-07-15 MED ORDER — GLIPIZIDE ER 10 MG PO TB24: 10.0000 mg | ORAL_TABLET | Freq: Every day | ORAL | Status: DC

## 2015-07-15 NOTE — Telephone Encounter (Signed)
New rx sent to pharmacy. See lab results with any questions

## 2015-07-16 ENCOUNTER — Other Ambulatory Visit: Payer: Self-pay | Admitting: Internal Medicine

## 2015-08-20 ENCOUNTER — Other Ambulatory Visit: Payer: Self-pay | Admitting: Internal Medicine

## 2015-08-20 NOTE — Telephone Encounter (Signed)
Rx sent electronically.  

## 2015-12-17 ENCOUNTER — Other Ambulatory Visit: Payer: Medicare PPO

## 2015-12-24 ENCOUNTER — Ambulatory Visit: Payer: Medicare PPO | Admitting: Internal Medicine

## 2016-01-13 ENCOUNTER — Ambulatory Visit (INDEPENDENT_AMBULATORY_CARE_PROVIDER_SITE_OTHER): Payer: Medicare PPO | Admitting: Internal Medicine

## 2016-01-13 ENCOUNTER — Encounter: Payer: Self-pay | Admitting: Internal Medicine

## 2016-01-13 VITALS — BP 138/84 | HR 105 | Temp 98.4°F | Ht 63.75 in | Wt 208.2 lb

## 2016-01-13 DIAGNOSIS — E1140 Type 2 diabetes mellitus with diabetic neuropathy, unspecified: Secondary | ICD-10-CM

## 2016-01-13 DIAGNOSIS — Z23 Encounter for immunization: Secondary | ICD-10-CM

## 2016-01-13 DIAGNOSIS — I10 Essential (primary) hypertension: Secondary | ICD-10-CM

## 2016-01-13 DIAGNOSIS — E1165 Type 2 diabetes mellitus with hyperglycemia: Secondary | ICD-10-CM

## 2016-01-13 DIAGNOSIS — F39 Unspecified mood [affective] disorder: Secondary | ICD-10-CM

## 2016-01-13 DIAGNOSIS — IMO0002 Reserved for concepts with insufficient information to code with codable children: Secondary | ICD-10-CM

## 2016-01-13 DIAGNOSIS — M797 Fibromyalgia: Secondary | ICD-10-CM

## 2016-01-13 LAB — CBC WITH DIFFERENTIAL/PLATELET
BASOS PCT: 0.6 % (ref 0.0–3.0)
Basophils Absolute: 0 10*3/uL (ref 0.0–0.1)
EOS ABS: 0.1 10*3/uL (ref 0.0–0.7)
Eosinophils Relative: 1.8 % (ref 0.0–5.0)
HCT: 31.9 % — ABNORMAL LOW (ref 36.0–46.0)
Hemoglobin: 10.9 g/dL — ABNORMAL LOW (ref 12.0–15.0)
Lymphocytes Relative: 22.2 % (ref 12.0–46.0)
Lymphs Abs: 1.5 10*3/uL (ref 0.7–4.0)
MCHC: 34.2 g/dL (ref 30.0–36.0)
MCV: 90.4 fl (ref 78.0–100.0)
MONO ABS: 0.4 10*3/uL (ref 0.1–1.0)
Monocytes Relative: 6.3 % (ref 3.0–12.0)
NEUTROS ABS: 4.8 10*3/uL (ref 1.4–7.7)
Neutrophils Relative %: 69.1 % (ref 43.0–77.0)
PLATELETS: 177 10*3/uL (ref 150.0–400.0)
RBC: 3.53 Mil/uL — ABNORMAL LOW (ref 3.87–5.11)
RDW: 14.3 % (ref 11.5–15.5)
WBC: 7 10*3/uL (ref 4.0–10.5)

## 2016-01-13 LAB — HEMOGLOBIN A1C: Hgb A1c MFr Bld: 8.3 % — ABNORMAL HIGH (ref 4.6–6.5)

## 2016-01-13 MED ORDER — TRAMADOL HCL 50 MG PO TABS: 50.0000 mg | ORAL_TABLET | Freq: Three times a day (TID) | ORAL | 0 refills | Status: DC | PRN

## 2016-01-13 NOTE — Progress Notes (Signed)
Subjective:    Patient ID: [name], female    DOB: 09/25/1940, 75 y.o.   MRN: [id number]  HPI Here for follow up of diabetes and other chronic medical conditions  Seems to be more achy all over--but especially neck, shoulders and low back Heating pad helps some Not much exercise-- just around house Left knee is worse--considering TKR now Hands are some better  Not checking sugars Does try to eat healthy--lots of baked chicken Weight is stable Extremity numbness is stable  Mood is okay Other than from the aching Still on the med  Current Outpatient Prescriptions on File Prior to Visit  Medication Sig Dispense Refill  . aspirin 81 MG tablet Take 81 mg by mouth daily.      . celecoxib (CELEBREX) 200 MG capsule TAKE ONE CAPSULE BY MOUTH EVERY DAY 90 capsule 3  . doxazosin (CARDURA) 4 MG tablet TAKE 1/2 TABLET TWICE DAILY 90 tablet 3  . FeFum-FePoly-FA-B Cmp-C-Biot (INTEGRA PLUS) CAPS Take 1 capsule by mouth every morning. 30 capsule 2  . gabapentin (NEURONTIN) 100 MG capsule TAKE 1 CAPSULE TWICE DAILY 180 capsule 3  . glipiZIDE (GLIPIZIDE XL) 10 MG 24 hr tablet Take 1 tablet (10 mg total) by mouth daily with breakfast. 180 tablet 3  . losartan-hydrochlorothiazide (HYZAAR) 100-25 MG tablet TAKE 1 TABLET EVERY DAY 90 tablet 3  . metFORMIN (GLUCOPHAGE) 1000 MG tablet TAKE 1 TABLET TWICE DAILY WITH MEALS 180 tablet 3  . Potassium 75 MG TABS Take 1 tablet by mouth daily.      Marland Kitchen tetanus & diphtheria toxoids, adult, (TENIVAC) 5-2 LFU injection Inject 0.5 mLs into the muscle once. 0.5 mL 0  . tiZANidine (ZANAFLEX) 4 MG tablet Take 1 tablet (4 mg total) by mouth at bedtime. 90 tablet 3  . traMADol (ULTRAM) 50 MG tablet Take 1 tablet (50 mg total) by mouth 3 (three) times daily as needed. 30 tablet 0  . Venlafaxine HCl 75 MG TB24 Take 1 tablet (75 mg total) by mouth daily. 90 each 3   No current facility-administered medications on file prior to visit.     Allergies  Allergen  Reactions  . Diphenhydramine Hcl Other (See Comments)    hallucinations   . Fluoxetine Hcl     REACTION: raised blood pressure  . Lisinopril     REACTION: dizzy  . Sertraline Hcl     REACTION: dizziness    Past Medical History:  Diagnosis Date  . Allergic rhinitis   . Anal fissure   . Arthritis   . Asthma   . Depression   . Diabetes mellitus    type 2  . Fibromyalgia   . GERD (gastroesophageal reflux disease)   . Hypertension   . Obesity   . Osteoarthritis     Past Surgical History:  Procedure Laterality Date  . ABDOMINAL HYSTERECTOMY  1970's  . CATARACT EXTRACTION, BILATERAL    . CHOLECYSTECTOMY  4/06  . TUBAL LIGATION  1970's  . VENTRAL HERNIA REPAIR  9/08   [facility]    Family History  Problem Relation Age of Onset  . Heart attack Father   . Kidney cancer Mother   . Lung cancer Brother   . Liver cancer Brother   . Coronary artery disease Father   . Hypertension Neg Hx   . Diabetes Other     Maternal side  . Breast cancer Neg Hx   . Colon cancer Neg Hx     Social History  Social History  . Marital status: Divorced    Spouse name: N/A  . Number of children: 3  . Years of education: N/A   Occupational History  . Homemaker Retired   Social History Main Topics  . Smoking status: Never Smoker  . Smokeless tobacco: Never Used  . Alcohol use No  . Drug use: No  . Sexual activity: Not on file   Other Topics Concern  . Not on file   Social History Narrative   No living will   No health care POA but would want son, [name], to make decisions about her care if needed   Would accept resuscitation attempts   Probably wouldn't want tube feeds if cognitively unaware   Review of Systems Appetite is fine Not sleeping well--no change    Objective:   Physical Exam  Constitutional: She appears well-developed and well-nourished. No distress.  Neck: Normal range of motion. Neck supple. No thyromegaly present.  Cardiovascular: Normal rate, regular  rhythm, normal heart sounds and intact distal pulses.  Exam reveals no gallop.   No murmur heard. Pulmonary/Chest: Effort normal and breath sounds normal. No respiratory distress. She has no wheezes. She has no rales.  Musculoskeletal: She exhibits no edema.  Lymphadenopathy:    She has no cervical adenopathy.  Psychiatric: She has a normal mood and affect. Her behavior is normal.          Assessment & Plan:

## 2016-01-13 NOTE — Assessment & Plan Note (Signed)
Hopefully stable control If up close or above 9%--will need action

## 2016-01-13 NOTE — Assessment & Plan Note (Signed)
Worse now--?from cold weather Discussed heat and regular activity Will refill tramadol for joint pain

## 2016-01-13 NOTE — Progress Notes (Signed)
Pre visit review using our clinic review tool, if applicable. No additional management support is needed unless otherwise documented below in the visit note. 

## 2016-01-13 NOTE — Assessment & Plan Note (Signed)
BP Readings from Last 3 Encounters:  01/13/16 138/84  07/12/15 122/88  06/18/15 (!) 160/80   Acceptable control

## 2016-01-13 NOTE — Assessment & Plan Note (Signed)
Generally okay on the medication

## 2016-02-22 ENCOUNTER — Other Ambulatory Visit: Payer: Self-pay | Admitting: Internal Medicine

## 2016-04-02 ENCOUNTER — Other Ambulatory Visit: Payer: Self-pay | Admitting: Internal Medicine

## 2016-05-07 ENCOUNTER — Encounter: Payer: Self-pay | Admitting: Internal Medicine

## 2016-05-07 MED ORDER — CELECOXIB 200 MG PO CAPS: ORAL_CAPSULE | ORAL | 0 refills | Status: DC

## 2016-07-13 ENCOUNTER — Encounter: Payer: Self-pay | Admitting: Internal Medicine

## 2016-07-13 ENCOUNTER — Ambulatory Visit (INDEPENDENT_AMBULATORY_CARE_PROVIDER_SITE_OTHER): Payer: Medicare PPO | Admitting: Internal Medicine

## 2016-07-13 ENCOUNTER — Other Ambulatory Visit: Payer: Self-pay | Admitting: Internal Medicine

## 2016-07-13 VITALS — BP 132/84 | HR 84 | Temp 97.8°F | Wt 205.0 lb

## 2016-07-13 DIAGNOSIS — E1165 Type 2 diabetes mellitus with hyperglycemia: Secondary | ICD-10-CM | POA: Diagnosis not present

## 2016-07-13 DIAGNOSIS — IMO0002 Reserved for concepts with insufficient information to code with codable children: Secondary | ICD-10-CM

## 2016-07-13 DIAGNOSIS — E1140 Type 2 diabetes mellitus with diabetic neuropathy, unspecified: Secondary | ICD-10-CM | POA: Diagnosis not present

## 2016-07-13 DIAGNOSIS — D509 Iron deficiency anemia, unspecified: Secondary | ICD-10-CM | POA: Diagnosis not present

## 2016-07-13 DIAGNOSIS — I10 Essential (primary) hypertension: Secondary | ICD-10-CM | POA: Diagnosis not present

## 2016-07-13 DIAGNOSIS — F39 Unspecified mood [affective] disorder: Secondary | ICD-10-CM | POA: Diagnosis not present

## 2016-07-13 LAB — COMPREHENSIVE METABOLIC PANEL
ALBUMIN: 4 g/dL (ref 3.5–5.2)
ALK PHOS: 64 U/L (ref 39–117)
ALT: 30 U/L (ref 0–35)
AST: 35 U/L (ref 0–37)
BILIRUBIN TOTAL: 0.4 mg/dL (ref 0.2–1.2)
BUN: 28 mg/dL — ABNORMAL HIGH (ref 6–23)
CO2: 24 mEq/L (ref 19–32)
Calcium: 9.3 mg/dL (ref 8.4–10.5)
Chloride: 102 mEq/L (ref 96–112)
Creatinine, Ser: 1.16 mg/dL (ref 0.40–1.20)
GFR: 48.42 mL/min — AB (ref >60.00)
Glucose, Bld: 205 mg/dL — ABNORMAL HIGH (ref 70–99)
Potassium: 4.5 mEq/L (ref 3.5–5.1)
Sodium: 134 mEq/L — ABNORMAL LOW (ref 135–145)
TOTAL PROTEIN: 7.7 g/dL (ref 6.0–8.3)

## 2016-07-13 LAB — CBC WITH DIFFERENTIAL/PLATELET
BASOS ABS: 0 10*3/uL (ref 0.0–0.1)
Basophils Relative: 0.5 % (ref 0.0–3.0)
Eosinophils Absolute: 0.3 10*3/uL (ref 0.0–0.7)
Eosinophils Relative: 3.6 % (ref 0.0–5.0)
HCT: 33.7 % — ABNORMAL LOW (ref 36.0–46.0)
Hemoglobin: 11.2 g/dL — ABNORMAL LOW (ref 12.0–15.0)
LYMPHS PCT: 20 % (ref 12.0–46.0)
Lymphs Abs: 1.5 10*3/uL (ref 0.7–4.0)
MCHC: 33.2 g/dL (ref 30.0–36.0)
MCV: 95.2 fl (ref 78.0–100.0)
MONOS PCT: 6.8 % (ref 3.0–12.0)
Monocytes Absolute: 0.5 10*3/uL (ref 0.1–1.0)
Neutro Abs: 5.2 10*3/uL (ref 1.4–7.7)
Neutrophils Relative %: 69.1 % (ref 43.0–77.0)
Platelets: 179 10*3/uL (ref 150.0–400.0)
RBC: 3.53 Mil/uL — AB (ref 3.87–5.11)
RDW: 14.1 % (ref 11.5–15.5)
WBC: 7.5 10*3/uL (ref 4.0–10.5)

## 2016-07-13 LAB — LIPID PANEL
CHOLESTEROL: 123 mg/dL (ref 0–200)
HDL: 41.3 mg/dL (ref >39.00)
LDL Cholesterol: 47 mg/dL (ref 0–99)
NONHDL: 81.8
Total CHOL/HDL Ratio: 3
Triglycerides: 175 mg/dL — ABNORMAL HIGH (ref 0.0–149.0)
VLDL: 35 mg/dL (ref 0.0–40.0)

## 2016-07-13 LAB — HM DIABETES FOOT EXAM

## 2016-07-13 LAB — HEMOGLOBIN A1C: HEMOGLOBIN A1C: 8.8 % — AB (ref 4.6–6.5)

## 2016-07-13 MED ORDER — OMEPRAZOLE 20 MG PO CPDR: 20.0000 mg | DELAYED_RELEASE_CAPSULE | Freq: Every day | ORAL | 3 refills | Status: DC

## 2016-07-13 MED ORDER — VENLAFAXINE HCL ER 75 MG PO TB24: 1.0000 | ORAL_TABLET | Freq: Every day | ORAL | 3 refills | Status: DC

## 2016-07-13 MED ORDER — CELECOXIB 200 MG PO CAPS: ORAL_CAPSULE | ORAL | 3 refills | Status: DC

## 2016-07-13 NOTE — Assessment & Plan Note (Signed)
Doing well on the venlafaxine

## 2016-07-13 NOTE — Assessment & Plan Note (Signed)
Doesn't check Will see what A1c is On gabapentin for mild neuropathy

## 2016-07-13 NOTE — Assessment & Plan Note (Signed)
Feels like she is more anemic again Will check labs Never had GI eval 2 years ago--not sure how it slipped through Will restart PPI for gastroprotection Set up with GI

## 2016-07-13 NOTE — Patient Instructions (Addendum)
Please set up your diabetes eye exam. Restart omeprazole---it would be okay to take it every other day.

## 2016-07-13 NOTE — Progress Notes (Signed)
Subjective:    Patient ID: [name], female    DOB: [birth year], 76 y.o.   MRN: [id number]  HPI Here for follow up of diabetes and other chronic health conditions  Not feeling great Concerned she is anemic again Reviewed records from 2016--never had GI eval Did have iron infusions and hemoglobin over 10 since then Continues on the iron supplement Gets cold when she lies down---especially feet and hands Stools always black due to iron supplement  Does have some spotting on underwear--thinks it is from her prolapsed vagina Increased urinary frequency Going to gyn about this---Dr Dallie Dad at [facility]  Not checking sugars Trying to be careful with eating Has lost a few pounds No exercise--has chronic joint pain Ran out of celebrex ---lost in [place] Doing better back on the celebrex Used to take omeprazole--- stopped it since reports that it can cause dementia  No chest pain No SOB No palpitations No edema  Mood has been okay Feels venlafaxine keeps her controlled  Current Outpatient Prescriptions on File Prior to Visit  Medication Sig Dispense Refill  . aspirin 81 MG tablet Take 81 mg by mouth daily.      . celecoxib (CELEBREX) 200 MG capsule TAKE ONE CAPSULE BY MOUTH EVERY DAY 30 capsule 0  . doxazosin (CARDURA) 4 MG tablet TAKE 1/2 TABLET TWICE DAILY 90 tablet 3  . FeFum-FePoly-FA-B Cmp-C-Biot (INTEGRA PLUS) CAPS Take 1 capsule by mouth every morning. 30 capsule 2  . gabapentin (NEURONTIN) 100 MG capsule TAKE 1 CAPSULE TWICE DAILY 180 capsule 3  . glipiZIDE (GLIPIZIDE XL) 10 MG 24 hr tablet Take 1 tablet (10 mg total) by mouth daily with breakfast. 180 tablet 3  . losartan-hydrochlorothiazide (HYZAAR) 100-25 MG tablet TAKE 1 TABLET EVERY DAY 90 tablet 3  . metFORMIN (GLUCOPHAGE) 1000 MG tablet TAKE 1 TABLET TWICE DAILY WITH MEALS 180 tablet 3  . Potassium 75 MG TABS Take 1 tablet by mouth daily.      Marland Kitchen tiZANidine (ZANAFLEX) 4 MG tablet TAKE 1 TABLET AT BEDTIME 90  tablet 3  . traMADol (ULTRAM) 50 MG tablet Take 1 tablet (50 mg total) by mouth 3 (three) times daily as needed. 60 tablet 0  . Venlafaxine HCl 75 MG TB24 Take 1 tablet (75 mg total) by mouth daily. 90 each 3   No current facility-administered medications on file prior to visit.     Allergies  Allergen Reactions  . Diphenhydramine Hcl Other (See Comments)    hallucinations   . Fluoxetine Hcl     REACTION: raised blood pressure  . Lisinopril     REACTION: dizzy  . Sertraline Hcl     REACTION: dizziness    Past Medical History:  Diagnosis Date  . Allergic rhinitis   . Anal fissure   . Arthritis   . Asthma   . Depression   . Diabetes mellitus    type 2  . Fibromyalgia   . GERD (gastroesophageal reflux disease)   . Hypertension   . Obesity   . Osteoarthritis     Past Surgical History:  Procedure Laterality Date  . ABDOMINAL HYSTERECTOMY  1970's  . CATARACT EXTRACTION, BILATERAL    . CHOLECYSTECTOMY  4/06  . TUBAL LIGATION  1970's  . VENTRAL HERNIA REPAIR  9/08   [facility]    Family History  Problem Relation Age of Onset  . Heart attack Father   . Kidney cancer Mother   . Lung cancer Brother   . Liver cancer  Brother   . Coronary artery disease Father   . Hypertension Neg Hx   . Diabetes Other        Maternal side  . Breast cancer Neg Hx   . Colon cancer Neg Hx     Social History   Social History  . Marital status: Divorced    Spouse name: N/A  . Number of children: 3  . Years of education: N/A   Occupational History  . Homemaker Retired   Social History Main Topics  . Smoking status: Never Smoker  . Smokeless tobacco: Never Used  . Alcohol use No  . Drug use: No  . Sexual activity: Not on file   Other Topics Concern  . Not on file   Social History Narrative   No living will   No health care POA but would want son, [name], to make decisions about her care if needed   Would accept resuscitation attempts   Probably wouldn't want tube feeds if  cognitively unaware   Review of Systems  Never sleeps well--nothing new. Not sure if this is causing more fatigue Appetite is down from before No sores or pain in feet     Objective:   Physical Exam  Constitutional: She appears well-nourished. No distress.  Cardiovascular: Normal rate, regular rhythm, normal heart sounds and intact distal pulses.  Exam reveals no gallop.   No murmur heard. Pulmonary/Chest: Effort normal and breath sounds normal. No respiratory distress. She has no wheezes. She has no rales.  Musculoskeletal: She exhibits no edema or tenderness.  Neurological:  Normal sensation in feet  Skin: No rash noted. No erythema.  No foot lesions  Psychiatric: She has a normal mood and affect. Her behavior is normal.          Assessment & Plan:

## 2016-07-13 NOTE — Assessment & Plan Note (Signed)
BP Readings from Last 3 Encounters:  07/13/16 132/84  01/13/16 138/84  07/12/15 122/88   Good control now

## 2016-07-13 NOTE — Assessment & Plan Note (Signed)
BMI over 35 with DM and HTN Has lost a few pounds Discussed eating better in general--- and trying to stay as active as her arthritis will allow her Goal to lose another 10# before 6 month follow up

## 2016-08-12 ENCOUNTER — Other Ambulatory Visit: Payer: Self-pay | Admitting: Internal Medicine

## 2016-08-13 DIAGNOSIS — N816 Rectocele: Secondary | ICD-10-CM | POA: Diagnosis not present

## 2016-08-13 DIAGNOSIS — E669 Obesity, unspecified: Secondary | ICD-10-CM | POA: Diagnosis not present

## 2016-08-13 DIAGNOSIS — K469 Unspecified abdominal hernia without obstruction or gangrene: Secondary | ICD-10-CM | POA: Diagnosis not present

## 2016-08-19 ENCOUNTER — Telehealth: Payer: Self-pay

## 2016-08-19 NOTE — Telephone Encounter (Signed)
I will wait for their fax and let them know she does not need a statin.

## 2016-08-19 NOTE — Telephone Encounter (Signed)
Her last LDL was 47!! Let them know if possible

## 2016-08-19 NOTE — Telephone Encounter (Signed)
V/M left from [place]; [facility] faxed over medication therapy alert on 07/31/16 for statin using pt with diabetes or cardiovascular disease. This call is to f/u on that fax and another fax will be sent to [facility] to be filled out for this pt and to be faxed to fax # [phone].

## 2016-09-05 ENCOUNTER — Other Ambulatory Visit: Payer: Self-pay | Admitting: Internal Medicine

## 2016-09-15 ENCOUNTER — Encounter: Payer: Self-pay | Admitting: Internal Medicine

## 2016-10-15 ENCOUNTER — Other Ambulatory Visit (INDEPENDENT_AMBULATORY_CARE_PROVIDER_SITE_OTHER): Payer: Medicare PPO

## 2016-10-15 DIAGNOSIS — E1165 Type 2 diabetes mellitus with hyperglycemia: Secondary | ICD-10-CM

## 2016-10-15 DIAGNOSIS — E1140 Type 2 diabetes mellitus with diabetic neuropathy, unspecified: Secondary | ICD-10-CM | POA: Diagnosis not present

## 2016-10-15 DIAGNOSIS — IMO0002 Reserved for concepts with insufficient information to code with codable children: Secondary | ICD-10-CM

## 2016-10-15 LAB — HEMOGLOBIN A1C: HEMOGLOBIN A1C: 8.5 % — AB (ref 4.6–6.5)

## 2016-11-07 ENCOUNTER — Other Ambulatory Visit: Payer: Self-pay | Admitting: Internal Medicine

## 2016-11-30 DIAGNOSIS — R197 Diarrhea, unspecified: Secondary | ICD-10-CM | POA: Diagnosis not present

## 2016-11-30 DIAGNOSIS — R1084 Generalized abdominal pain: Secondary | ICD-10-CM | POA: Diagnosis not present

## 2016-12-01 ENCOUNTER — Encounter: Payer: Self-pay | Admitting: Physician Assistant

## 2016-12-08 ENCOUNTER — Encounter: Payer: Self-pay | Admitting: *Deleted

## 2016-12-10 ENCOUNTER — Ambulatory Visit (INDEPENDENT_AMBULATORY_CARE_PROVIDER_SITE_OTHER): Payer: Medicare PPO | Admitting: Physician Assistant

## 2016-12-10 ENCOUNTER — Encounter: Payer: Self-pay | Admitting: Physician Assistant

## 2016-12-10 ENCOUNTER — Other Ambulatory Visit (INDEPENDENT_AMBULATORY_CARE_PROVIDER_SITE_OTHER): Payer: Medicare PPO

## 2016-12-10 VITALS — BP 126/72 | HR 86 | Ht 63.75 in | Wt 203.0 lb

## 2016-12-10 DIAGNOSIS — R1084 Generalized abdominal pain: Secondary | ICD-10-CM

## 2016-12-10 DIAGNOSIS — R197 Diarrhea, unspecified: Secondary | ICD-10-CM

## 2016-12-10 DIAGNOSIS — R109 Unspecified abdominal pain: Secondary | ICD-10-CM

## 2016-12-10 LAB — CBC WITH DIFFERENTIAL/PLATELET
BASOS PCT: 0.5 % (ref 0.0–3.0)
Basophils Absolute: 0 10*3/uL (ref 0.0–0.1)
EOS PCT: 2.7 % (ref 0.0–5.0)
Eosinophils Absolute: 0.2 10*3/uL (ref 0.0–0.7)
HEMATOCRIT: 32.8 % — AB (ref 36.0–46.0)
HEMOGLOBIN: 10.9 g/dL — AB (ref 12.0–15.0)
Lymphocytes Relative: 17.6 % (ref 12.0–46.0)
Lymphs Abs: 1.5 10*3/uL (ref 0.7–4.0)
MCHC: 33.2 g/dL (ref 30.0–36.0)
MCV: 95.9 fl (ref 78.0–100.0)
Monocytes Absolute: 0.5 10*3/uL (ref 0.1–1.0)
Monocytes Relative: 5.3 % (ref 3.0–12.0)
Neutro Abs: 6.4 10*3/uL (ref 1.4–7.7)
Neutrophils Relative %: 73.9 % (ref 43.0–77.0)
Platelets: 185 10*3/uL (ref 150.0–400.0)
RBC: 3.42 Mil/uL — ABNORMAL LOW (ref 3.87–5.11)
RDW: 13.6 % (ref 11.5–15.5)
WBC: 8.7 10*3/uL (ref 4.0–10.5)

## 2016-12-10 LAB — COMPREHENSIVE METABOLIC PANEL
ALBUMIN: 3.9 g/dL (ref 3.5–5.2)
ALT: 24 U/L (ref 0–35)
AST: 24 U/L (ref 0–37)
Alkaline Phosphatase: 63 U/L (ref 39–117)
BUN: 32 mg/dL — ABNORMAL HIGH (ref 6–23)
CALCIUM: 9.2 mg/dL (ref 8.4–10.5)
CHLORIDE: 102 meq/L (ref 96–112)
CO2: 22 mEq/L (ref 19–32)
Creatinine, Ser: 1.39 mg/dL — ABNORMAL HIGH (ref 0.40–1.20)
GFR: 39.26 mL/min — AB (ref >60.00)
Glucose, Bld: 206 mg/dL — ABNORMAL HIGH (ref 70–99)
POTASSIUM: 4.1 meq/L (ref 3.5–5.1)
SODIUM: 134 meq/L — AB (ref 135–145)
Total Bilirubin: 0.4 mg/dL (ref 0.2–1.2)
Total Protein: 7.8 g/dL (ref 6.0–8.3)

## 2016-12-10 LAB — SEDIMENTATION RATE: SED RATE: 54 mm/h — AB (ref 0–30)

## 2016-12-10 MED ORDER — DICYCLOMINE HCL 10 MG PO CAPS: 10.0000 mg | ORAL_CAPSULE | Freq: Three times a day (TID) | ORAL | 2 refills | Status: DC

## 2016-12-10 NOTE — Progress Notes (Signed)
Subjective:    Patient ID: [name], female    DOB: 1940-03-08, 76 y.o.   MRN: [id number]  HPI Mayuri is a pleasant 76 year old white female, known remotely to Dr. Fuller Plan who is referred today by Dr. Silvio Pate for evaluation of abdominal pain and diarrhea. She was last seen here in 2009 when she had screening colonoscopy which showed sigmoid diverticulosis and otherwise was negative. Patient has history of hypertension, GERD, adult-onset diabetes mellitus, fibromyalgia, history of iron deficiency, and morbid obesity Her current symptoms started about 2 weeks ago with diarrhea, generally having 4-5 bowel movements each morning and sometimes additional bowel movements throughout the day after eating. She says whatever she eats is going right through. Her appetite has been decreased, denies any nausea or vomiting but has had some queasiness. She's complaining of abdominal discomfort in her lower abdomen bilaterally and some cramping. This seems to be worse after eating. No fever or chills. No recent antibiotics, no changes in medications supplements her diet. She's also been having some heartburn and indigestion just over the past couple of weeks, was given a prescription for Prilosec but didn't want to take it because she was afraid it may cause Alzheimer's. She's not had any imaging or labs to date for her acute symptoms.  Review of Systems  Pertinent positive and negative review of systems were noted in the above HPI section.  All other review of systems was otherwise negative.  Outpatient Encounter Prescriptions as of 12/10/2016  Medication Sig  . aspirin 81 MG tablet Take 81 mg by mouth daily.    . celecoxib (CELEBREX) 200 MG capsule TAKE ONE CAPSULE BY MOUTH EVERY DAY  . doxazosin (CARDURA) 4 MG tablet TAKE 1/2 TABLET TWICE DAILY  . FeFum-FePoly-FA-B Cmp-C-Biot (INTEGRA PLUS) CAPS Take 1 capsule by mouth every morning.  . gabapentin (NEURONTIN) 100 MG capsule TAKE 1 CAPSULE TWICE DAILY  .  glipiZIDE (GLUCOTROL XL) 10 MG 24 hr tablet Take 1 tablet (10 mg total) by mouth 2 (two) times daily.  Marland Kitchen losartan-hydrochlorothiazide (HYZAAR) 100-25 MG tablet TAKE 1 TABLET EVERY DAY  . metFORMIN (GLUCOPHAGE) 1000 MG tablet TAKE 1 TABLET TWICE DAILY WITH MEALS  . omeprazole (PRILOSEC) 20 MG capsule Take 1 capsule (20 mg total) by mouth daily.  . Potassium 75 MG TABS Take 1 tablet by mouth daily.    Marland Kitchen tiZANidine (ZANAFLEX) 4 MG tablet TAKE 1 TABLET AT BEDTIME  . traMADol (ULTRAM) 50 MG tablet Take 1 tablet (50 mg total) by mouth 3 (three) times daily as needed.  . Venlafaxine HCl 75 MG TB24 Take 1 tablet (75 mg total) by mouth daily.  Marland Kitchen dicyclomine (BENTYL) 10 MG capsule Take 1 capsule (10 mg total) by mouth 3 (three) times daily before meals.   No facility-administered encounter medications on file as of 12/10/2016.    Allergies  Allergen Reactions  . Diphenhydramine Hcl Other (See Comments)    hallucinations   . Fluoxetine Hcl     REACTION: raised blood pressure  . Lisinopril     REACTION: dizzy  . Sertraline Hcl     REACTION: dizziness   Patient Active Problem List   Diagnosis Date Noted  . Morbid obesity ([place]) 07/13/2016  . Right hand pain 07/12/2015  . Advance directive discussed with patient 07/12/2015  . Iron deficiency anemia 07/18/2014  . Type 2 diabetes, uncontrolled, with neuropathy ([place]) 02/22/2012  . Routine general medical examination at a health care facility 08/25/2011  . Osteoarthritis, knee 08/25/2011  .  Fibromyalgia 02/25/2011  . Obesity 02/25/2011  . Diabetic polyneuropathy ([place]) 02/25/2011  . UTERINE PROLAPSE 02/25/2010  . GERD 02/13/2009  . CARCINOMA, BASAL CELL 05/18/2008  . ALLERGIC RHINITIS 05/18/2008  . Episodic mood disorder ([place]) 06/20/2007  . Essential hypertension, benign 10/27/2006   Social History   Social History  . Marital status: Divorced    Spouse name: N/A  . Number of children: 3  . Years of education: N/A   Occupational History    . Homemaker Retired   Social History Main Topics  . Smoking status: Never Smoker  . Smokeless tobacco: Never Used  . Alcohol use No  . Drug use: No  . Sexual activity: Not on file   Other Topics Concern  . Not on file   Social History Narrative   No living will   No health care POA but would want son, [name], to make decisions about her care if needed   Would accept resuscitation attempts   Probably wouldn't want tube feeds if cognitively unaware    Ms. Moskowitz's family history includes Coronary artery disease in her father; Diabetes in her other; Heart attack in her father; Kidney cancer in her mother; Liver cancer in her brother; Lung cancer in her brother.      Objective:    Vitals:   12/10/16 1334  BP: 126/72  Pulse: 86    Physical Exam well-developed elderly white female in no acute distress, accompanied by her daughter both pleasant blood pressure 126/72 pulse 86, height 5 foot 3, weight 203, BMI 35.1. HEENT ;nontraumatic normocephalic EOMI PERRLA sclera anicteric, Cardiovascular; regular rate and rhythm with S1-S2 no murmur or gallop, Pulmonary; clear bilaterally, Abdomen; obese, soft, she has bilateral lower and mid quadrant abdominal tenderness no guarding or rebound no palpable mass or hepatosplenomegaly bowel sounds are active, Rectal; exam not done, Extremities; no clubbing cyanosis or edema skin warm and dry, Neuropsych; mood and affect appropriate       Assessment & Plan:   #52 76 year old white female with 2 week history of acute illness with diarrhea, abdominal cramping, lower abdominal discomfort and queasiness. Rule out acute infectious gastroenteritis, rule out possible C. difficile colitis, rule out other inflammatory colitis #2 history of diverticulosis #3 morbid obesity Number for fibromyalgia #5 adult-onset diabetes mellitus #6 GERD #7 hypertension  Plan; CBC with differential, CMET sedimentation rate, GI pathogen panel, stool for C. difficile  by PCR Patient is agreeable to take Prilosec 20 mg by mouth every morning test over the next couple of weeks. Patient is encouraged to push fluids, and try a bland liquid diet to start Start Bentyl 10 mg by mouth twice a day to  3 times a day as needed for diarrhea and abdominal cramping Further plans pending results of labs and stool studies.  Annett Boxwell S Sukhmani Fetherolf PA-C 12/10/2016   Cc: Venia Carbon, MD

## 2016-12-10 NOTE — Patient Instructions (Signed)
Please go to the basement level to have your labs drawn and stool studies.  You can get Over the Counter Prilosec 20 mg, take 1 tab in the morning for 14 days.   Soft bland diet.

## 2016-12-10 NOTE — Progress Notes (Signed)
Reviewed and agree with management plan.  Jeriah Corkum T. Bryceson Grape, MD FACG 

## 2016-12-14 ENCOUNTER — Other Ambulatory Visit: Payer: Medicare PPO

## 2016-12-14 DIAGNOSIS — R1084 Generalized abdominal pain: Secondary | ICD-10-CM | POA: Diagnosis not present

## 2016-12-14 DIAGNOSIS — R197 Diarrhea, unspecified: Secondary | ICD-10-CM | POA: Diagnosis not present

## 2016-12-14 DIAGNOSIS — R109 Unspecified abdominal pain: Secondary | ICD-10-CM

## 2016-12-15 LAB — CLOSTRIDIUM DIFFICILE BY PCR: CDIFFPCR: NOT DETECTED

## 2016-12-17 LAB — GASTROINTESTINAL PATHOGEN PANEL PCR
C. DIFFICILE TOX A/B, PCR: NOT DETECTED
CRYPTOSPORIDIUM, PCR: NOT DETECTED
Campylobacter, PCR: NOT DETECTED
E COLI (ETEC) LT/ST, PCR: NOT DETECTED
E COLI 0157, PCR: NOT DETECTED
E coli (STEC) stx1/stx2, PCR: NOT DETECTED
Giardia lamblia, PCR: NOT DETECTED
Norovirus, PCR: NOT DETECTED
Rotavirus A, PCR: NOT DETECTED
Salmonella, PCR: NOT DETECTED
Shigella, PCR: NOT DETECTED

## 2016-12-17 LAB — FECAL LACTOFERRIN, QUANT
FECAL LACTOFERRIN: POSITIVE — AB
MICRO NUMBER:: 81239803
SPECIMEN QUALITY:: ADEQUATE

## 2016-12-23 ENCOUNTER — Encounter: Payer: Medicare PPO | Admitting: Internal Medicine

## 2016-12-25 ENCOUNTER — Encounter: Payer: Self-pay | Admitting: Internal Medicine

## 2016-12-25 ENCOUNTER — Other Ambulatory Visit: Payer: Self-pay | Admitting: Internal Medicine

## 2016-12-25 ENCOUNTER — Ambulatory Visit (INDEPENDENT_AMBULATORY_CARE_PROVIDER_SITE_OTHER): Payer: Medicare PPO | Admitting: Internal Medicine

## 2016-12-25 VITALS — BP 126/78 | HR 64 | Temp 97.9°F | Ht 63.5 in | Wt 202.0 lb

## 2016-12-25 DIAGNOSIS — N183 Chronic kidney disease, stage 3 unspecified: Secondary | ICD-10-CM

## 2016-12-25 DIAGNOSIS — I10 Essential (primary) hypertension: Secondary | ICD-10-CM | POA: Diagnosis not present

## 2016-12-25 DIAGNOSIS — Z0000 Encounter for general adult medical examination without abnormal findings: Secondary | ICD-10-CM | POA: Diagnosis not present

## 2016-12-25 DIAGNOSIS — E1140 Type 2 diabetes mellitus with diabetic neuropathy, unspecified: Secondary | ICD-10-CM

## 2016-12-25 DIAGNOSIS — Z23 Encounter for immunization: Secondary | ICD-10-CM | POA: Diagnosis not present

## 2016-12-25 DIAGNOSIS — N1832 Chronic kidney disease, stage 3b: Secondary | ICD-10-CM | POA: Insufficient documentation

## 2016-12-25 DIAGNOSIS — N1831 Chronic kidney disease, stage 3a: Secondary | ICD-10-CM | POA: Insufficient documentation

## 2016-12-25 DIAGNOSIS — E1165 Type 2 diabetes mellitus with hyperglycemia: Secondary | ICD-10-CM

## 2016-12-25 DIAGNOSIS — Z1211 Encounter for screening for malignant neoplasm of colon: Secondary | ICD-10-CM

## 2016-12-25 DIAGNOSIS — Z7189 Other specified counseling: Secondary | ICD-10-CM

## 2016-12-25 DIAGNOSIS — N184 Chronic kidney disease, stage 4 (severe): Secondary | ICD-10-CM | POA: Insufficient documentation

## 2016-12-25 DIAGNOSIS — E1122 Type 2 diabetes mellitus with diabetic chronic kidney disease: Secondary | ICD-10-CM | POA: Diagnosis not present

## 2016-12-25 DIAGNOSIS — IMO0002 Reserved for concepts with insufficient information to code with codable children: Secondary | ICD-10-CM

## 2016-12-25 MED ORDER — TRAMADOL HCL 50 MG PO TABS: 50.0000 mg | ORAL_TABLET | Freq: Three times a day (TID) | ORAL | 0 refills | Status: DC | PRN

## 2016-12-25 MED ORDER — TETANUS-DIPHTHERIA TOXOIDS TD 5-2 LFU IM INJ: 0.5000 mL | INJECTION | Freq: Once | INTRAMUSCULAR | 0 refills | Status: AC

## 2016-12-25 NOTE — Assessment & Plan Note (Signed)
Last test worse but ?prerenal Will recheck at next visit On [facility]

## 2016-12-25 NOTE — Patient Instructions (Signed)
Please get your tetanus booster at the pharmacy. 

## 2016-12-25 NOTE — Progress Notes (Signed)
Subjective:    Patient ID: [name], female    DOB: [birth year], 76 y.o.   MRN: [id number]  HPI Here for Medicare wellness and follow up of chronic health conditions Reviewed form and advanced directives Reviewed other doctors No alcohol or tobacco Doesn't exercise--discussed Vision is okay. No hearing problems No falls No depression or anhedonia Independent with instrumental ADLs No sig memory problems  Having abdominal pains  Diarrhea for 2 weeks Went to GI--but no diagnosis Trying medication before eating--bentyl---this has helped  Working harder on the proper eating Hopes her sugars are better--but not checking Some hand numbness--but not really in feet  No chest pian No palpitations Dizziness with first GI pill--not otherwise Some swelling in left foot  Current Outpatient Medications on File Prior to Visit  Medication Sig Dispense Refill  . aspirin 81 MG tablet Take 81 mg by mouth daily.      . celecoxib (CELEBREX) 200 MG capsule TAKE ONE CAPSULE BY MOUTH EVERY DAY 90 capsule 3  . dicyclomine (BENTYL) 10 MG capsule Take 1 capsule (10 mg total) by mouth 3 (three) times daily before meals. 90 capsule 2  . doxazosin (CARDURA) 4 MG tablet TAKE 1/2 TABLET TWICE DAILY 90 tablet 3  . FeFum-FePoly-FA-B Cmp-C-Biot (INTEGRA PLUS) CAPS Take 1 capsule by mouth every morning. 30 capsule 2  . gabapentin (NEURONTIN) 100 MG capsule TAKE 1 CAPSULE TWICE DAILY 180 capsule 3  . glipiZIDE (GLUCOTROL XL) 10 MG 24 hr tablet Take 1 tablet (10 mg total) by mouth 2 (two) times daily. 180 tablet 3  . losartan-hydrochlorothiazide (HYZAAR) 100-25 MG tablet TAKE 1 TABLET EVERY DAY 90 tablet 3  . metFORMIN (GLUCOPHAGE) 1000 MG tablet TAKE 1 TABLET TWICE DAILY WITH MEALS 180 tablet 3  . omeprazole (PRILOSEC) 20 MG capsule Take 1 capsule (20 mg total) by mouth daily. 90 capsule 3  . Potassium 75 MG TABS Take 1 tablet by mouth daily.      Marland Kitchen tiZANidine (ZANAFLEX) 4 MG tablet TAKE 1 TABLET AT  BEDTIME 90 tablet 3  . traMADol (ULTRAM) 50 MG tablet Take 1 tablet (50 mg total) by mouth 3 (three) times daily as needed. 60 tablet 0  . Venlafaxine HCl 75 MG TB24 Take 1 tablet (75 mg total) by mouth daily. 90 each 3   No current facility-administered medications on file prior to visit.     Allergies  Allergen Reactions  . Diphenhydramine Hcl Other (See Comments)    hallucinations   . Fluoxetine Hcl     REACTION: raised blood pressure  . Lisinopril     REACTION: dizzy  . Sertraline Hcl     REACTION: dizziness    Past Medical History:  Diagnosis Date  . Allergic rhinitis   . Anal fissure   . Arthritis   . Asthma   . Depression   . Diabetes mellitus    type 2  . Fibromyalgia   . GERD (gastroesophageal reflux disease)   . Hypertension   . Obesity   . Osteoarthritis     Past Surgical History:  Procedure Laterality Date  . ABDOMINAL HYSTERECTOMY  1970's  . CATARACT EXTRACTION, BILATERAL    . CHOLECYSTECTOMY  4/06  . TUBAL LIGATION  1970's  . VENTRAL HERNIA REPAIR  9/08   [facility]    Family History  Problem Relation Age of Onset  . Heart attack Father   . Coronary artery disease Father   . Kidney cancer Mother   . Lung cancer Brother   .  Liver cancer Brother   . Diabetes Other        Maternal side  . Hypertension Neg Hx   . Breast cancer Neg Hx   . Colon cancer Neg Hx     Social History   Socioeconomic History  . Marital status: Divorced    Spouse name: Not on file  . Number of children: 3  . Years of education: Not on file  . Highest education level: Not on file  Social Needs  . Financial resource strain: Not on file  . Food insecurity - worry: Not on file  . Food insecurity - inability: Not on file  . Transportation needs - medical: Not on file  . Transportation needs - non-medical: Not on file  Occupational History  . Occupation: Scientist, research (physical sciences): RETIRED  Tobacco Use  . Smoking status: Never Smoker  . Smokeless tobacco: Never Used    Substance and Sexual Activity  . Alcohol use: No    Alcohol/week: 0.0 oz  . Drug use: No  . Sexual activity: Not on file  Other Topics Concern  . Not on file  Social History Narrative   No living will   No health care POA but would want son, [name], to make decisions about her care if needed. Other 2 children are alternatives   Would accept resuscitation attempts   Probably wouldn't want tube feeds if cognitively unaware   Review of Systems Just feeling aching-- fibromyalgia? All joints ache when it gets cold--uses the tramadol sparingly Mostly dentures--hasn't been seeing dentist Wears seat belt Appetite back to normal--weight up and down but little change    Objective:   Physical Exam  Constitutional: She is oriented to person, place, and time. No distress.  HENT:  Mouth/Throat: Oropharynx is clear and moist. No oropharyngeal exudate.  Neck: No thyromegaly present.  Cardiovascular: Normal rate, regular rhythm, normal heart sounds and intact distal pulses. Exam reveals no gallop.  No murmur heard. Pulmonary/Chest: Effort normal and breath sounds normal. No respiratory distress. She has no wheezes. She has no rales.  Abdominal: Soft. She exhibits no distension. There is no rebound and no guarding.  Slightly sensitive all over  Musculoskeletal: She exhibits no edema or tenderness.  Lymphadenopathy:    She has no cervical adenopathy.  Neurological: She is alert and oriented to person, place, and time.  President--- "Dwaine Deter, Bush" [phone] [id number] Recall 3/3  Fairly normal sensation in feet  Skin: No rash noted. No erythema.  No foot lesions          Assessment & Plan:

## 2016-12-25 NOTE — Progress Notes (Signed)
Hearing Screening   Method: Audiometry   125Hz  250Hz  500Hz  1000Hz  2000Hz  3000Hz  4000Hz  6000Hz  8000Hz   Right ear:   20 40 20  0    Left ear:   20 20 25  20       Visual Acuity Screening   Right eye Left eye Both eyes  Without correction: 20/50 20/70 20/50   With correction:

## 2016-12-25 NOTE — Assessment & Plan Note (Signed)
Has blank forms but hasn't done See social history

## 2016-12-25 NOTE — Assessment & Plan Note (Signed)
I have personally reviewed the Medicare Annual Wellness questionnaire and have noted 1. The patient's medical and social history 2. Their use of alcohol, tobacco or illicit drugs 3. Their current medications and supplements 4. The patient's functional ability including ADL's, fall risks, home safety risks and hearing or visual             impairment. 5. Diet and physical activities 6. Evidence for depression or mood disorders  The patients weight, height, BMI and visual acuity have been recorded in the chart I have made referrals, counseling and provided education to the patient based review of the above and I have provided the pt with a written personalized care plan for preventive services.  I have provided you with a copy of your personalized plan for preventive services. Please take the time to review along with your updated medication list.  Td Rx Will check FIT given the anemia (from CKD?) Discussed water exercises or something Flu vaccine

## 2016-12-25 NOTE — Assessment & Plan Note (Signed)
BP Readings from Last 3 Encounters:  12/25/16 126/78  12/10/16 126/72  07/13/16 132/84   Good control

## 2016-12-25 NOTE — Assessment & Plan Note (Signed)
Unclear if any better Too soon to check--will have back in 3 months Minimal sensory changes now

## 2017-01-07 ENCOUNTER — Other Ambulatory Visit (INDEPENDENT_AMBULATORY_CARE_PROVIDER_SITE_OTHER): Payer: Medicare PPO

## 2017-01-07 DIAGNOSIS — Z1211 Encounter for screening for malignant neoplasm of colon: Secondary | ICD-10-CM | POA: Diagnosis not present

## 2017-01-07 LAB — FECAL OCCULT BLOOD, IMMUNOCHEMICAL: Fecal Occult Bld: NEGATIVE

## 2017-01-13 ENCOUNTER — Other Ambulatory Visit: Payer: Self-pay | Admitting: Internal Medicine

## 2017-01-20 ENCOUNTER — Other Ambulatory Visit: Payer: Self-pay | Admitting: *Deleted

## 2017-01-20 MED ORDER — VENLAFAXINE HCL ER 75 MG PO TB24: 1.0000 | ORAL_TABLET | Freq: Every day | ORAL | 3 refills | Status: DC

## 2017-01-22 ENCOUNTER — Encounter: Payer: Self-pay | Admitting: Internal Medicine

## 2017-01-28 ENCOUNTER — Encounter: Payer: Self-pay | Admitting: Internal Medicine

## 2017-01-28 MED ORDER — VENLAFAXINE HCL 37.5 MG PO TABS: 37.5000 mg | ORAL_TABLET | Freq: Two times a day (BID) | ORAL | 1 refills | Status: DC

## 2017-01-28 NOTE — Telephone Encounter (Signed)
I am not sure what happened to the message that was sent last week. I did resend it to Dr Silvio Pate yesterday.

## 2017-01-28 NOTE — Addendum Note (Signed)
Addended by: [name] I on: 01/28/2017 01:04 PM   Modules accepted: Orders

## 2017-03-20 ENCOUNTER — Other Ambulatory Visit: Payer: Self-pay | Admitting: Internal Medicine

## 2017-03-29 ENCOUNTER — Encounter: Payer: Self-pay | Admitting: Internal Medicine

## 2017-03-29 ENCOUNTER — Ambulatory Visit: Payer: Medicare PPO | Admitting: Internal Medicine

## 2017-03-29 VITALS — BP 122/74 | HR 71 | Temp 98.0°F | Wt 202.0 lb

## 2017-03-29 DIAGNOSIS — I10 Essential (primary) hypertension: Secondary | ICD-10-CM | POA: Diagnosis not present

## 2017-03-29 DIAGNOSIS — E1165 Type 2 diabetes mellitus with hyperglycemia: Secondary | ICD-10-CM | POA: Diagnosis not present

## 2017-03-29 DIAGNOSIS — E1140 Type 2 diabetes mellitus with diabetic neuropathy, unspecified: Secondary | ICD-10-CM

## 2017-03-29 DIAGNOSIS — E1122 Type 2 diabetes mellitus with diabetic chronic kidney disease: Secondary | ICD-10-CM

## 2017-03-29 DIAGNOSIS — N183 Chronic kidney disease, stage 3 (moderate): Secondary | ICD-10-CM | POA: Diagnosis not present

## 2017-03-29 DIAGNOSIS — IMO0002 Reserved for concepts with insufficient information to code with codable children: Secondary | ICD-10-CM

## 2017-03-29 DIAGNOSIS — F39 Unspecified mood [affective] disorder: Secondary | ICD-10-CM | POA: Diagnosis not present

## 2017-03-29 LAB — RENAL FUNCTION PANEL
Albumin: 4 g/dL (ref 3.5–5.2)
BUN: 30 mg/dL — AB (ref 6–23)
CALCIUM: 9.4 mg/dL (ref 8.4–10.5)
CO2: 24 meq/L (ref 19–32)
CREATININE: 1.17 mg/dL (ref 0.40–1.20)
Chloride: 100 mEq/L (ref 96–112)
GFR: 47.85 mL/min — ABNORMAL LOW (ref >60.00)
GLUCOSE: 129 mg/dL — AB (ref 70–99)
Phosphorus: 3.7 mg/dL (ref 2.3–4.6)
Potassium: 4.3 mEq/L (ref 3.5–5.1)
Sodium: 133 mEq/L — ABNORMAL LOW (ref 135–145)

## 2017-03-29 LAB — HEMOGLOBIN A1C: HEMOGLOBIN A1C: 8.3 % — AB (ref 4.6–6.5)

## 2017-03-29 MED ORDER — VENLAFAXINE HCL 37.5 MG PO TABS: 37.5000 mg | ORAL_TABLET | Freq: Two times a day (BID) | ORAL | 11 refills | Status: DC

## 2017-03-29 NOTE — Assessment & Plan Note (Signed)
Not checking No weight gain Goal under 8% but will add another oral drug Celesta Gentile or other) if over 9%

## 2017-03-29 NOTE — Assessment & Plan Note (Signed)
BP Readings from Last 3 Encounters:  03/29/17 122/74  12/25/16 126/78  12/10/16 126/72   Good control

## 2017-03-29 NOTE — Assessment & Plan Note (Signed)
Creatinine up just last time If still abnormal, will have to stop celebrex

## 2017-03-29 NOTE — Progress Notes (Signed)
Subjective:    Patient ID: [name], female    DOB: 1940-06-23, 77 y.o.   MRN: [id number]  HPI Here for follow up of poorly controlled diabetes  Winter has been tough for her Doesn't exercise still Has been trying to be careful with her eating Weight is stable  Not checking sugars No hypoglycemic reactions Mild sensory changes in feet---especially if on them all day  Not able to get the long acting venlafaxine Now on the short acting--doing okay at just once a day  Current Outpatient Medications on File Prior to Visit  Medication Sig Dispense Refill  . aspirin 81 MG tablet Take 81 mg by mouth daily.      . celecoxib (CELEBREX) 200 MG capsule TAKE ONE CAPSULE BY MOUTH EVERY DAY 90 capsule 3  . doxazosin (CARDURA) 4 MG tablet TAKE 1/2 TABLET TWICE DAILY 90 tablet 3  . FeFum-FePoly-FA-B Cmp-C-Biot (INTEGRA PLUS) CAPS Take 1 capsule by mouth every morning. 30 capsule 2  . gabapentin (NEURONTIN) 100 MG capsule TAKE 1 CAPSULE TWICE DAILY 180 capsule 3  . glipiZIDE (GLUCOTROL XL) 10 MG 24 hr tablet Take 1 tablet (10 mg total) by mouth 2 (two) times daily. 180 tablet 3  . losartan-hydrochlorothiazide (HYZAAR) 100-25 MG tablet TAKE 1 TABLET EVERY DAY 90 tablet 3  . metFORMIN (GLUCOPHAGE) 1000 MG tablet TAKE 1 TABLET TWICE DAILY WITH MEALS 180 tablet 3  . omeprazole (PRILOSEC) 20 MG capsule Take 1 capsule (20 mg total) by mouth daily. 90 capsule 3  . Potassium 75 MG TABS Take 1 tablet by mouth daily.      Marland Kitchen tiZANidine (ZANAFLEX) 4 MG tablet TAKE 1 TABLET AT BEDTIME 90 tablet 3  . traMADol (ULTRAM) 50 MG tablet Take 1 tablet (50 mg total) 3 (three) times daily as needed by mouth. 60 tablet 0  . venlafaxine (EFFEXOR) 37.5 MG tablet Take 1 tablet (37.5 mg total) by mouth 2 (two) times daily. 60 tablet 1   No current facility-administered medications on file prior to visit.     Allergies  Allergen Reactions  . Diphenhydramine Hcl Other (See Comments)    hallucinations   .  Fluoxetine Hcl     REACTION: raised blood pressure  . Lisinopril     REACTION: dizzy  . Sertraline Hcl     REACTION: dizziness    Past Medical History:  Diagnosis Date  . Allergic rhinitis   . Anal fissure   . Arthritis   . Asthma   . Depression   . Diabetes mellitus    type 2  . Fibromyalgia   . GERD (gastroesophageal reflux disease)   . Hypertension   . Obesity   . Osteoarthritis     Past Surgical History:  Procedure Laterality Date  . ABDOMINAL HYSTERECTOMY  1970's  . CATARACT EXTRACTION, BILATERAL    . CHOLECYSTECTOMY  4/06  . TUBAL LIGATION  1970's  . VENTRAL HERNIA REPAIR  9/08   [facility]    Family History  Problem Relation Age of Onset  . Heart attack Father   . Coronary artery disease Father   . Kidney cancer Mother   . Lung cancer Brother   . Liver cancer Brother   . Diabetes Other        Maternal side  . Hypertension Neg Hx   . Breast cancer Neg Hx   . Colon cancer Neg Hx     Social History   Socioeconomic History  . Marital status: Divorced  Spouse name: Not on file  . Number of children: 3  . Years of education: Not on file  . Highest education level: Not on file  Social Needs  . Financial resource strain: Not on file  . Food insecurity - worry: Not on file  . Food insecurity - inability: Not on file  . Transportation needs - medical: Not on file  . Transportation needs - non-medical: Not on file  Occupational History  . Occupation: Scientist, research (physical sciences): RETIRED  Tobacco Use  . Smoking status: Never Smoker  . Smokeless tobacco: Never Used  Substance and Sexual Activity  . Alcohol use: No    Alcohol/week: 0.0 oz  . Drug use: No  . Sexual activity: Not on file  Other Topics Concern  . Not on file  Social History Narrative   No living will   No health care POA but would want son, [name], to make decisions about her care if needed. Other 2 children are alternatives   Would accept resuscitation attempts   Probably wouldn't want  tube feeds if cognitively unaware   Review of Systems General body aches Not sleeping well--has started melatonin and this is helping Still uses the celebrex daily    Objective:   Physical Exam  Constitutional: No distress.  Neck: No thyromegaly present.  Cardiovascular: Normal rate, regular rhythm, normal heart sounds and intact distal pulses. Exam reveals no gallop.  No murmur heard. Pulmonary/Chest: Effort normal and breath sounds normal. No respiratory distress. She has no wheezes. She has no rales.  Musculoskeletal: She exhibits no edema.  Lymphadenopathy:    She has no cervical adenopathy.  Skin:  No foot lesions  Psychiatric: She has a normal mood and affect. Her behavior is normal.          Assessment & Plan:

## 2017-03-29 NOTE — Assessment & Plan Note (Signed)
Doing okay on the short acting venlafaxine

## 2017-07-06 ENCOUNTER — Other Ambulatory Visit: Payer: Self-pay | Admitting: Internal Medicine

## 2017-07-21 ENCOUNTER — Telehealth: Payer: Self-pay | Admitting: Internal Medicine

## 2017-07-21 NOTE — Telephone Encounter (Signed)
Copied from [place] [id number]. Topic: Quick Communication - Rx Refill/Question >> Jul 21, 2017  1:10 PM [name] wrote: Medication: gabapentin (NEURONTIN) 100 MG capsule  Has the patient contacted their pharmacy? Yes.  The pharmacy called the office for this refill (Agent: If no, request that the patient contact the pharmacy for the refill.) (Agent: If yes, when and what did the pharmacy advise?)  Preferred Pharmacy (with phone number or street name): [facility], [place] [phone] (Phone) [phone] (Fax)      Agent: Please be advised that RX refills may take up to 3 business days. We ask that you follow-up with your pharmacy.

## 2017-07-22 ENCOUNTER — Other Ambulatory Visit: Payer: Self-pay | Admitting: *Deleted

## 2017-07-22 MED ORDER — GABAPENTIN 100 MG PO CAPS: 100.0000 mg | ORAL_CAPSULE | Freq: Two times a day (BID) | ORAL | 0 refills | Status: DC

## 2017-07-22 NOTE — Telephone Encounter (Signed)
Rx refilled with no additional- patient has follow up appt 09/27/17- to address diabetes management- can address more refills at appt.

## 2017-09-27 ENCOUNTER — Encounter: Payer: Self-pay | Admitting: Internal Medicine

## 2017-09-27 ENCOUNTER — Ambulatory Visit: Payer: Medicare PPO | Admitting: Internal Medicine

## 2017-09-27 VITALS — BP 124/84 | HR 94 | Temp 98.1°F | Ht 63.5 in | Wt 203.0 lb

## 2017-09-27 DIAGNOSIS — E1165 Type 2 diabetes mellitus with hyperglycemia: Secondary | ICD-10-CM

## 2017-09-27 DIAGNOSIS — IMO0002 Reserved for concepts with insufficient information to code with codable children: Secondary | ICD-10-CM

## 2017-09-27 DIAGNOSIS — E1140 Type 2 diabetes mellitus with diabetic neuropathy, unspecified: Secondary | ICD-10-CM | POA: Diagnosis not present

## 2017-09-27 DIAGNOSIS — N183 Chronic kidney disease, stage 3 (moderate): Secondary | ICD-10-CM

## 2017-09-27 DIAGNOSIS — M170 Bilateral primary osteoarthritis of knee: Secondary | ICD-10-CM

## 2017-09-27 DIAGNOSIS — E1122 Type 2 diabetes mellitus with diabetic chronic kidney disease: Secondary | ICD-10-CM | POA: Diagnosis not present

## 2017-09-27 LAB — RENAL FUNCTION PANEL
Albumin: 4 g/dL (ref 3.5–5.2)
BUN: 24 mg/dL — ABNORMAL HIGH (ref 6–23)
CALCIUM: 9.6 mg/dL (ref 8.4–10.5)
CO2: 23 meq/L (ref 19–32)
CREATININE: 1.41 mg/dL — AB (ref 0.40–1.20)
Chloride: 98 mEq/L (ref 96–112)
GFR: 38.53 mL/min — AB (ref >60.00)
Glucose, Bld: 306 mg/dL — ABNORMAL HIGH (ref 70–99)
POTASSIUM: 4.5 meq/L (ref 3.5–5.1)
Phosphorus: 5 mg/dL — ABNORMAL HIGH (ref 2.3–4.6)
Sodium: 133 mEq/L — ABNORMAL LOW (ref 135–145)

## 2017-09-27 LAB — LIPID PANEL
CHOL/HDL RATIO: 3
Cholesterol: 127 mg/dL (ref 0–200)
HDL: 44.4 mg/dL (ref >39.00)
LDL CALC: 43 mg/dL (ref 0–99)
NONHDL: 82.23
Triglycerides: 197 mg/dL — ABNORMAL HIGH (ref 0.0–149.0)
VLDL: 39.4 mg/dL (ref 0.0–40.0)

## 2017-09-27 LAB — POCT GLYCOSYLATED HEMOGLOBIN (HGB A1C): HEMOGLOBIN A1C: 8.7 % — AB (ref 4.0–5.6)

## 2017-09-27 LAB — HM DIABETES FOOT EXAM

## 2017-09-27 MED ORDER — METFORMIN HCL ER 750 MG PO TB24: 1500.0000 mg | ORAL_TABLET | Freq: Every day | ORAL | 3 refills | Status: DC

## 2017-09-27 MED ORDER — TRIAMCINOLONE ACETONIDE 0.1 % EX CREA: 1.0000 "application " | TOPICAL_CREAM | Freq: Two times a day (BID) | CUTANEOUS | 1 refills | Status: DC | PRN

## 2017-09-27 NOTE — Assessment & Plan Note (Signed)
BMI over 35 with diabetes, HTN, CKD 3 etc Discussed lifestyle changes Goal to lose 10# before next visit

## 2017-09-27 NOTE — Assessment & Plan Note (Signed)
Back on intermittent celebrex Will recheck labs and have her stop if any worse

## 2017-09-27 NOTE — Assessment & Plan Note (Signed)
celebrex helps but will have to stop if creatinine up Handicapped placard done

## 2017-09-27 NOTE — Progress Notes (Signed)
Subjective:    Patient ID: [name], female    DOB: 1940-03-08, 77 y.o.   MRN: [id number]  HPI Here for follow up of diabetes and other chronic health conditions Daughter [name] is with her  Doing okay  Still having diarrhea  Loose most mornings--incontinent at times Not checking sugars No hypoglycemic reactions  Appetite is fine Weight fairly stable BMI still a little over 35  Mood has been okay  Current Outpatient Medications on File Prior to Visit  Medication Sig Dispense Refill  . aspirin 81 MG tablet Take 81 mg by mouth daily.      . celecoxib (CELEBREX) 200 MG capsule TAKE ONE CAPSULE BY MOUTH EVERY DAY 90 capsule 3  . Cyanocobalamin (VITAMIN B12 PO) Take by mouth.    . doxazosin (CARDURA) 4 MG tablet TAKE 1/2 TABLET TWICE DAILY 90 tablet 3  . FeFum-FePoly-FA-B Cmp-C-Biot (INTEGRA PLUS) CAPS Take 1 capsule by mouth every morning. 30 capsule 2  . gabapentin (NEURONTIN) 100 MG capsule Take 1 capsule (100 mg total) by mouth 2 (two) times daily. 180 capsule 0  . glipiZIDE (GLUCOTROL XL) 10 MG 24 hr tablet TAKE 1 TABLET TWICE DAILY 180 tablet 3  . losartan-hydrochlorothiazide (HYZAAR) 100-25 MG tablet TAKE 1 TABLET EVERY DAY 90 tablet 3  . Melatonin 10 MG TABS Take by mouth.    . metFORMIN (GLUCOPHAGE) 1000 MG tablet TAKE 1 TABLET TWICE DAILY WITH MEALS 180 tablet 3  . Multiple Vitamin (MULTIVITAMIN) tablet Take 1 tablet by mouth daily.    Marland Kitchen omeprazole (PRILOSEC) 20 MG capsule Take 1 capsule (20 mg total) by mouth daily. 90 capsule 3  . Potassium 75 MG TABS Take 1 tablet by mouth daily.      . Probiotic Product (PROBIOTIC PO) Take by mouth.    Marland Kitchen tiZANidine (ZANAFLEX) 4 MG tablet TAKE 1 TABLET AT BEDTIME 90 tablet 3  . venlafaxine (EFFEXOR) 37.5 MG tablet Take 1 tablet (37.5 mg total) by mouth 2 (two) times daily. 60 tablet 11   No current facility-administered medications on file prior to visit.     Allergies  Allergen Reactions  . Diphenhydramine Hcl Other (See  Comments)    hallucinations   . Fluoxetine Hcl     REACTION: raised blood pressure  . Lisinopril     REACTION: dizzy  . Sertraline Hcl     REACTION: dizziness    Past Medical History:  Diagnosis Date  . Allergic rhinitis   . Anal fissure   . Arthritis   . Asthma   . Depression   . Diabetes mellitus    type 2  . Fibromyalgia   . GERD (gastroesophageal reflux disease)   . Hypertension   . Obesity   . Osteoarthritis     Past Surgical History:  Procedure Laterality Date  . ABDOMINAL HYSTERECTOMY  1970's  . CATARACT EXTRACTION, BILATERAL    . CHOLECYSTECTOMY  4/06  . TUBAL LIGATION  1970's  . VENTRAL HERNIA REPAIR  9/08   [facility]    Family History  Problem Relation Age of Onset  . Heart attack Father   . Coronary artery disease Father   . Kidney cancer Mother   . Lung cancer Brother   . Liver cancer Brother   . Diabetes Other        Maternal side  . Hypertension Neg Hx   . Breast cancer Neg Hx   . Colon cancer Neg Hx     Social History   Socioeconomic  History  . Marital status: Divorced    Spouse name: Not on file  . Number of children: 3  . Years of education: Not on file  . Highest education level: Not on file  Occupational History  . Occupation: Scientist, research (physical sciences): RETIRED  Social Needs  . Financial resource strain: Not on file  . Food insecurity:    Worry: Not on file    Inability: Not on file  . Transportation needs:    Medical: Not on file    Non-medical: Not on file  Tobacco Use  . Smoking status: Never Smoker  . Smokeless tobacco: Never Used  Substance and Sexual Activity  . Alcohol use: No    Alcohol/week: 0.0 standard drinks  . Drug use: No  . Sexual activity: Not on file  Lifestyle  . Physical activity:    Days per week: Not on file    Minutes per session: Not on file  . Stress: Not on file  Relationships  . Social connections:    Talks on phone: Not on file    Gets together: Not on file    Attends religious service: Not  on file    Active member of club or organization: Not on file    Attends meetings of clubs or organizations: Not on file    Relationship status: Not on file  . Intimate partner violence:    Fear of current or ex partner: Not on file    Emotionally abused: Not on file    Physically abused: Not on file    Forced sexual activity: Not on file  Other Topics Concern  . Not on file  Social History Narrative   No living will   No health care POA but would want son, [name], to make decisions about her care if needed. Other 2 children are alternatives   Would accept resuscitation attempts   Probably wouldn't want tube feeds if cognitively unaware   Review of Systems Reduced celebrex to every other day Discussed the CKD 3--will need to recheck Not sleeping okay    Objective:   Physical Exam  Constitutional: She appears well-developed. No distress.  Neck: No thyromegaly present.  Cardiovascular: Normal rate, regular rhythm, normal heart sounds and intact distal pulses. Exam reveals no gallop.  No murmur heard. Respiratory: Effort normal and breath sounds normal. No respiratory distress. She has no wheezes. She has no rales.  Lymphadenopathy:    She has no cervical adenopathy.  Neurological:  Normal sensation in feet  Skin:  No foot lesions Irritated rash on posterior neck (looks like contact derm)  Psychiatric: She has a normal mood and affect. Her behavior is normal.           Assessment & Plan:

## 2017-09-27 NOTE — Assessment & Plan Note (Signed)
Lab Results  Component Value Date   HGBA1C 8.7 (A) 09/27/2017   Control worse Not tolerating the metformin ---will change to lower dose of the ER No sig neuropathy now  Discussed lifestyle improvement Plan to add canaglifozin or liraglutide next time if not any better

## 2017-09-28 NOTE — Telephone Encounter (Signed)
Patient said that her Internet is messed up and can not see her labs. Please call her with the results. Call [phone]

## 2017-10-01 NOTE — Telephone Encounter (Signed)
Spoke to pt

## 2017-11-22 ENCOUNTER — Other Ambulatory Visit: Payer: Self-pay | Admitting: Internal Medicine

## 2017-12-14 ENCOUNTER — Encounter: Payer: Self-pay | Admitting: Gastroenterology

## 2017-12-28 ENCOUNTER — Other Ambulatory Visit: Payer: Self-pay | Admitting: Internal Medicine

## 2018-01-21 ENCOUNTER — Telehealth: Payer: Self-pay

## 2018-01-21 NOTE — Telephone Encounter (Signed)
I will certainly discuss with her----but she is over 76 and should not be included in the metric

## 2018-01-21 NOTE — Telephone Encounter (Signed)
Please address statin therapy with patient per [facility]/Metric gap, when you are able to. Thank you

## 2018-01-24 NOTE — Telephone Encounter (Signed)
Noted. Will address with [facility]. Thank you

## 2018-01-28 ENCOUNTER — Other Ambulatory Visit: Payer: Self-pay | Admitting: Internal Medicine

## 2018-02-13 ENCOUNTER — Encounter: Payer: Self-pay | Admitting: Gastroenterology

## 2018-03-31 ENCOUNTER — Encounter: Payer: Medicare PPO | Admitting: Internal Medicine

## 2018-04-19 ENCOUNTER — Other Ambulatory Visit: Payer: Self-pay | Admitting: Internal Medicine

## 2018-05-11 ENCOUNTER — Telehealth: Payer: Self-pay | Admitting: Internal Medicine

## 2018-05-11 NOTE — Telephone Encounter (Signed)
I refilled her medication. We need to set her up for Webex or at least the Doxy.me for her diabetes. She will need to come for a fingerstick A1C. We can check with Dr Silvio Pate to see if he wants to do other labs, also once she is scheduled. Can you help me get her set up? Thanks

## 2018-05-11 NOTE — Telephone Encounter (Signed)
Dr. Silvio Pate Do you want any other labs done when patient comes to have her A1c checked?

## 2018-05-12 ENCOUNTER — Encounter: Payer: Medicare PPO | Admitting: Internal Medicine

## 2018-05-12 NOTE — Telephone Encounter (Signed)
Nothing else needed right now

## 2018-05-13 NOTE — Telephone Encounter (Signed)
Yes--I will just call her and note the video fail

## 2018-05-13 NOTE — Telephone Encounter (Signed)
I spoke to patient and she said she doesn't have video capability. Can you do a phone call?

## 2018-05-18 NOTE — Telephone Encounter (Signed)
I reached patient after several attempts.  I asked patient about scheduling the appointment and she was very upset.  She said she just received very bad news.  I asked her to call me back to schedule the appointment.

## 2018-06-02 ENCOUNTER — Other Ambulatory Visit: Payer: Self-pay | Admitting: Internal Medicine

## 2018-06-03 ENCOUNTER — Telehealth: Payer: Self-pay | Admitting: Internal Medicine

## 2018-06-03 NOTE — Telephone Encounter (Signed)
Pharmacy requests refill on patients losartan/hctz 100/25mg   Last refilled: #90, 3 refills on 07/06/17 Last OV:09/27/17 Next OV: None on record, however Morey [facility]mingbird has been trying to reach out to patient to get her set up for a phone visit as she is due for follow up. [facility]   Will pend for Dr. Silvio Pate and Morey Hummingbird.  Hopefully we can get her set up soon.  Appears there was some distress when carrie reached out to her and she was to call back but hasn't.  Need to get patient schedule so we can fill her meds.

## 2018-06-03 NOTE — Telephone Encounter (Signed)
Pharmacy requests refill on patients Gabapentin  Last Refill: #180, 0refills on 11/23/17 LAST OV: 09/27/17 NEXT OV:  Needs phone visit with Dr. Rolland Porter Mail order   Patient was upset by attempt by Morey Hummingbird to set up phone visit.  Was to call back to schedule phone visit and has not since 05/11/18  Will forward to Dr. Silvio Pate an copy [place].

## 2018-06-04 NOTE — Telephone Encounter (Signed)
She needs follow up due to the diabetes. See if you can explain and get the lab work, etc We can see her in office if she prefers

## 2018-06-06 NOTE — Telephone Encounter (Signed)
I spoke to patient and she's caring for her daughter.  Her daughter is terminal.  Patient said she never knows when she'll have a free minute. Please advise.

## 2018-06-06 NOTE — Telephone Encounter (Signed)
Spoke to pt

## 2018-06-06 NOTE — Telephone Encounter (Signed)
I did renew her BP med for a year and the gabapentin for 3 months. Hopefully she can get in within the 3 months Sorry to hear about her daughter

## 2018-06-08 NOTE — Telephone Encounter (Signed)
See previous note. Discusses appt with pt.

## 2018-07-06 ENCOUNTER — Other Ambulatory Visit: Payer: Self-pay | Admitting: Internal Medicine

## 2018-07-07 NOTE — Telephone Encounter (Signed)
Last filled 04-21-18 #90 Last OV 09-27-17 No Future OV [employer]

## 2018-07-09 ENCOUNTER — Other Ambulatory Visit: Payer: Self-pay | Admitting: Internal Medicine

## 2018-07-22 ENCOUNTER — Other Ambulatory Visit: Payer: Self-pay | Admitting: Internal Medicine

## 2018-09-15 ENCOUNTER — Ambulatory Visit (INDEPENDENT_AMBULATORY_CARE_PROVIDER_SITE_OTHER): Payer: Medicare PPO | Admitting: Internal Medicine

## 2018-09-15 ENCOUNTER — Encounter: Payer: Self-pay | Admitting: Internal Medicine

## 2018-09-15 DIAGNOSIS — J0110 Acute frontal sinusitis, unspecified: Secondary | ICD-10-CM | POA: Diagnosis not present

## 2018-09-15 MED ORDER — AMOXICILLIN 500 MG PO TABS: 1000.0000 mg | ORAL_TABLET | Freq: Two times a day (BID) | ORAL | 0 refills | Status: AC

## 2018-09-15 NOTE — Progress Notes (Signed)
Subjective:    Patient ID: [name], female    DOB: 07-31-1940, 78 y.o.   MRN: [id number]  HPI  Virtual visit for respiratory symptoms Identification done Reviewed billing and she gave consent I am in my office and she is in her home  Having facial pain--maxillary and frontal Frontal headache Pain in neck Sore throat Has been going on since 3 weeks ago--will come on and then improve, then relapse  No fever Slight cough and PND only Has had past sinus problems Some left ear pain and tinnitus---that is better No SOB  Current Outpatient Medications on File Prior to Visit  Medication Sig Dispense Refill  . aspirin 81 MG tablet Take 81 mg by mouth daily.      . Cyanocobalamin (VITAMIN B12 PO) Take by mouth.    . doxazosin (CARDURA) 4 MG tablet TAKE 1/2 TABLET TWICE DAILY 90 tablet 3  . FeFum-FePoly-FA-B Cmp-C-Biot (INTEGRA PLUS) CAPS Take 1 capsule by mouth every morning. 30 capsule 2  . gabapentin (NEURONTIN) 100 MG capsule TAKE 1 CAPSULE TWICE DAILY 180 capsule 0  . glipiZIDE (GLUCOTROL XL) 10 MG 24 hr tablet TAKE 1 TABLET TWICE DAILY 180 tablet 0  . losartan-hydrochlorothiazide (HYZAAR) 100-25 MG tablet TAKE 1 TABLET EVERY DAY 90 tablet 3  . metFORMIN (GLUCOPHAGE-XR) 750 MG 24 hr tablet TAKE 2 TABLETS (1,500 MG TOTAL) BY MOUTH DAILY WITH BREAKFAST. 180 tablet 0  . Multiple Vitamin (MULTIVITAMIN) tablet Take 1 tablet by mouth daily.    . Potassium 75 MG TABS Take 1 tablet by mouth daily.      Marland Kitchen tiZANidine (ZANAFLEX) 4 MG tablet TAKE 1 TABLET AT BEDTIME 90 tablet 0  . venlafaxine (EFFEXOR) 37.5 MG tablet Take 1 tablet by mouth twice daily 60 tablet 1   No current facility-administered medications on file prior to visit.     Allergies  Allergen Reactions  . Diphenhydramine Hcl Other (See Comments)    hallucinations   . Fluoxetine Hcl     REACTION: raised blood pressure  . Lisinopril     REACTION: dizzy  . Sertraline Hcl     REACTION: dizziness    Past Medical  History:  Diagnosis Date  . Allergic rhinitis   . Anal fissure   . Arthritis   . Asthma   . Depression   . Diabetes mellitus    type 2  . Diverticulosis   . Fibromyalgia   . GERD (gastroesophageal reflux disease)   . Hypertension   . Obesity   . Osteoarthritis     Past Surgical History:  Procedure Laterality Date  . ABDOMINAL HYSTERECTOMY  1970's  . CATARACT EXTRACTION, BILATERAL    . CHOLECYSTECTOMY  4/06  . TUBAL LIGATION  1970's  . VENTRAL HERNIA REPAIR  9/08   [facility]    Family History  Problem Relation Age of Onset  . Heart attack Father   . Coronary artery disease Father   . Kidney cancer Mother   . Lung cancer Brother   . Liver cancer Brother   . Diabetes Other        Maternal side  . Brain cancer Daughter   . Hypertension Neg Hx   . Breast cancer Neg Hx   . Colon cancer Neg Hx     Social History   Socioeconomic History  . Marital status: Divorced    Spouse name: Not on file  . Number of children: 3  . Years of education: Not on file  . Highest  education level: Not on file  Occupational History  . Occupation: Scientist, research (physical sciences): RETIRED  Social Needs  . Financial resource strain: Not on file  . Food insecurity    Worry: Not on file    Inability: Not on file  . Transportation needs    Medical: Not on file    Non-medical: Not on file  Tobacco Use  . Smoking status: Never Smoker  . Smokeless tobacco: Never Used  Substance and Sexual Activity  . Alcohol use: No    Alcohol/week: 0.0 standard drinks  . Drug use: No  . Sexual activity: Not on file  Lifestyle  . Physical activity    Days per week: Not on file    Minutes per session: Not on file  . Stress: Not on file  Relationships  . Social Herbalist on phone: Not on file    Gets together: Not on file    Attends religious service: Not on file    Active member of club or organization: Not on file    Attends meetings of clubs or organizations: Not on file    Relationship  status: Not on file  . Intimate partner violence    Fear of current or ex partner: Not on file    Emotionally abused: Not on file    Physically abused: Not on file    Forced sexual activity: Not on file  Other Topics Concern  . Not on file  Social History Narrative   No living will   No health care POA but would want son, [name], to make decisions about her care if needed. Other 2 children are alternatives   Would accept resuscitation attempts   Probably wouldn't want tube feeds if cognitively unaware   Review of Systems  Has been staying in--and mask when rarely out Daughter recently died--stress with this     Objective:   Physical Exam  Constitutional: She appears well-developed. No distress.  HENT:  Some frontotemporal tenderness  Respiratory: Effort normal. No respiratory distress.           Assessment & Plan:

## 2018-09-15 NOTE — Assessment & Plan Note (Signed)
She has had symptoms for 3 weeks on and off Not consistent with COVID--but possible Discussed testing--will hold off Will try empiric therapy with amoxil--and may need to go to urgent care if ongoing symptoms

## 2018-09-16 ENCOUNTER — Telehealth: Payer: Self-pay | Admitting: Internal Medicine

## 2018-09-16 NOTE — Telephone Encounter (Signed)
I left a message for patient to return my call.  Dr.Letvak is requesting:     Please set patint up for in office diabetes follow up in a month or so

## 2018-10-19 ENCOUNTER — Other Ambulatory Visit: Payer: Self-pay | Admitting: Internal Medicine

## 2018-10-19 NOTE — Telephone Encounter (Signed)
Zanaflex last filled 07/07/2018 #90 and Gabapentin #180 last filled 06/04/2018... please advise if okay to refill

## 2018-10-28 ENCOUNTER — Encounter: Payer: Self-pay | Admitting: Internal Medicine

## 2018-10-28 ENCOUNTER — Ambulatory Visit (INDEPENDENT_AMBULATORY_CARE_PROVIDER_SITE_OTHER): Payer: Medicare PPO | Admitting: Internal Medicine

## 2018-10-28 ENCOUNTER — Other Ambulatory Visit: Payer: Self-pay

## 2018-10-28 VITALS — BP 138/82 | HR 91 | Temp 98.3°F | Ht 64.0 in | Wt 202.0 lb

## 2018-10-28 DIAGNOSIS — E1140 Type 2 diabetes mellitus with diabetic neuropathy, unspecified: Secondary | ICD-10-CM | POA: Diagnosis not present

## 2018-10-28 DIAGNOSIS — IMO0002 Reserved for concepts with insufficient information to code with codable children: Secondary | ICD-10-CM

## 2018-10-28 DIAGNOSIS — E1122 Type 2 diabetes mellitus with diabetic chronic kidney disease: Secondary | ICD-10-CM | POA: Diagnosis not present

## 2018-10-28 DIAGNOSIS — Z23 Encounter for immunization: Secondary | ICD-10-CM | POA: Diagnosis not present

## 2018-10-28 DIAGNOSIS — N183 Chronic kidney disease, stage 3 (moderate): Secondary | ICD-10-CM | POA: Diagnosis not present

## 2018-10-28 DIAGNOSIS — I10 Essential (primary) hypertension: Secondary | ICD-10-CM | POA: Diagnosis not present

## 2018-10-28 DIAGNOSIS — E1165 Type 2 diabetes mellitus with hyperglycemia: Secondary | ICD-10-CM

## 2018-10-28 DIAGNOSIS — F39 Unspecified mood [affective] disorder: Secondary | ICD-10-CM

## 2018-10-28 LAB — T4, FREE: Free T4: 0.89 ng/dL (ref 0.60–1.60)

## 2018-10-28 LAB — CBC
HCT: 32.6 % — ABNORMAL LOW (ref 36.0–46.0)
Hemoglobin: 11.1 g/dL — ABNORMAL LOW (ref 12.0–15.0)
MCHC: 34.2 g/dL (ref 30.0–36.0)
MCV: 94.5 fl (ref 78.0–100.0)
Platelets: 161 10*3/uL (ref 150.0–400.0)
RBC: 3.45 Mil/uL — ABNORMAL LOW (ref 3.87–5.11)
RDW: 13.6 % (ref 11.5–15.5)
WBC: 7.9 10*3/uL (ref 4.0–10.5)

## 2018-10-28 LAB — RENAL FUNCTION PANEL
Albumin: 3.8 g/dL (ref 3.5–5.2)
BUN: 30 mg/dL — ABNORMAL HIGH (ref 6–23)
CO2: 26 mEq/L (ref 19–32)
Calcium: 9.6 mg/dL (ref 8.4–10.5)
Chloride: 98 mEq/L (ref 96–112)
Creatinine, Ser: 1.27 mg/dL — ABNORMAL HIGH (ref 0.40–1.20)
GFR: 40.79 mL/min — ABNORMAL LOW (ref >60.00)
Glucose, Bld: 253 mg/dL — ABNORMAL HIGH (ref 70–99)
Phosphorus: 3.7 mg/dL (ref 2.3–4.6)
Potassium: 5.3 mEq/L — ABNORMAL HIGH (ref 3.5–5.1)
Sodium: 135 mEq/L (ref 135–145)

## 2018-10-28 LAB — HEPATIC FUNCTION PANEL
ALT: 20 U/L (ref 0–35)
AST: 19 U/L (ref 0–37)
Albumin: 3.8 g/dL (ref 3.5–5.2)
Alkaline Phosphatase: 81 U/L (ref 39–117)
Bilirubin, Direct: 0.1 mg/dL (ref 0.0–0.3)
Total Bilirubin: 0.4 mg/dL (ref 0.2–1.2)
Total Protein: 7.3 g/dL (ref 6.0–8.3)

## 2018-10-28 LAB — LIPID PANEL
Cholesterol: 151 mg/dL (ref 0–200)
HDL: 44.8 mg/dL (ref >39.00)
LDL Cholesterol: 68 mg/dL (ref 0–99)
NonHDL: 106.26
Total CHOL/HDL Ratio: 3
Triglycerides: 193 mg/dL — ABNORMAL HIGH (ref 0.0–149.0)
VLDL: 38.6 mg/dL (ref 0.0–40.0)

## 2018-10-28 LAB — VITAMIN D 25 HYDROXY (VIT D DEFICIENCY, FRACTURES): VITD: 20.17 ng/mL — ABNORMAL LOW (ref 30.00–100.00)

## 2018-10-28 LAB — HEMOGLOBIN A1C: Hgb A1c MFr Bld: 11.7 % — ABNORMAL HIGH (ref 4.6–6.5)

## 2018-10-28 NOTE — Assessment & Plan Note (Signed)
Hopefully not worse If over 9%, need to consider more meds Not excited about a shot

## 2018-10-28 NOTE — Addendum Note (Signed)
Addended by: Pilar Grammes on: 10/28/2018 01:48 PM   Modules accepted: Orders

## 2018-10-28 NOTE — Assessment & Plan Note (Signed)
BP Readings from Last 3 Encounters:  10/28/18 138/82  09/27/17 124/84  03/29/17 122/74   Good control Due for labs

## 2018-10-28 NOTE — Assessment & Plan Note (Signed)
Is on ARB If worse, will set up with nephrology

## 2018-10-28 NOTE — Assessment & Plan Note (Signed)
Having a hard time with daughter's death Chronic issues as well Okay with the med

## 2018-10-28 NOTE — Progress Notes (Signed)
Subjective:    Patient ID: [name], female    DOB: 1940/10/22, 78 y.o.   MRN: [id number]  HPI Here for follow up of diabetes and other chronic health conditions  Has had a hard time in the past few months Daughter died in 16-Aug-2022 Rapid decline from glioblastoma Also stress with COVID isolation Son does most of her errands --he is living with her temporarily  Not checking sugars  Tries to eat properly Foot pain from neuropathy is not as bad-- gabapentin at bedtime  Known CKD-- GFR ~40  Weight is the same Discussed the BMI of 35 with other problems  Ongoing arthritis  Uses glucosamine Off celebrex--except very rarely  No chest pain No SOB No dizziness or syncope  Current Outpatient Medications on File Prior to Visit  Medication Sig Dispense Refill  . aspirin 81 MG tablet Take 81 mg by mouth daily.      . Cyanocobalamin (VITAMIN B12 PO) Take by mouth.    . doxazosin (CARDURA) 4 MG tablet TAKE 1/2 TABLET TWICE DAILY 90 tablet 3  . FeFum-FePoly-FA-B Cmp-C-Biot (INTEGRA PLUS) CAPS Take 1 capsule by mouth every morning. 30 capsule 2  . gabapentin (NEURONTIN) 100 MG capsule TAKE 1 CAPSULE TWICE DAILY 180 capsule 3  . glipiZIDE (GLUCOTROL XL) 10 MG 24 hr tablet TAKE 1 TABLET TWICE DAILY 180 tablet 0  . losartan-hydrochlorothiazide (HYZAAR) 100-25 MG tablet TAKE 1 TABLET EVERY DAY 90 tablet 3  . metFORMIN (GLUCOPHAGE-XR) 750 MG 24 hr tablet TAKE 2 TABLETS EVERY DAY WITH BREAKFAST 180 tablet 0  . Multiple Vitamin (MULTIVITAMIN) tablet Take 1 tablet by mouth daily.    . Potassium 75 MG TABS Take 1 tablet by mouth daily.      Marland Kitchen tiZANidine (ZANAFLEX) 4 MG tablet TAKE 1 TABLET AT BEDTIME 90 tablet 0  . venlafaxine (EFFEXOR) 37.5 MG tablet Take 1 tablet by mouth twice daily 60 tablet 1   No current facility-administered medications on file prior to visit.     Allergies  Allergen Reactions  . Diphenhydramine Hcl Other (See Comments)    hallucinations   . Fluoxetine Hcl    REACTION: raised blood pressure  . Lisinopril     REACTION: dizzy  . Sertraline Hcl     REACTION: dizziness    Past Medical History:  Diagnosis Date  . Allergic rhinitis   . Anal fissure   . Arthritis   . Asthma   . Depression   . Diabetes mellitus    type 2  . Diverticulosis   . Fibromyalgia   . GERD (gastroesophageal reflux disease)   . Hypertension   . Obesity   . Osteoarthritis     Past Surgical History:  Procedure Laterality Date  . ABDOMINAL HYSTERECTOMY  1970's  . CATARACT EXTRACTION, BILATERAL    . CHOLECYSTECTOMY  4/06  . TUBAL LIGATION  1970's  . VENTRAL HERNIA REPAIR  9/08   [facility]    Family History  Problem Relation Age of Onset  . Heart attack Father   . Coronary artery disease Father   . Kidney cancer Mother   . Lung cancer Brother   . Liver cancer Brother   . Diabetes Other        Maternal side  . Brain cancer Daughter   . Hypertension Neg Hx   . Breast cancer Neg Hx   . Colon cancer Neg Hx     Social History   Socioeconomic History  . Marital status: Divorced  Spouse name: Not on file  . Number of children: 3  . Years of education: Not on file  . Highest education level: Not on file  Occupational History  . Occupation: Scientist, research (physical sciences): RETIRED  Social Needs  . Financial resource strain: Not on file  . Food insecurity    Worry: Not on file    Inability: Not on file  . Transportation needs    Medical: Not on file    Non-medical: Not on file  Tobacco Use  . Smoking status: Never Smoker  . Smokeless tobacco: Never Used  Substance and Sexual Activity  . Alcohol use: No    Alcohol/week: 0.0 standard drinks  . Drug use: No  . Sexual activity: Not on file  Lifestyle  . Physical activity    Days per week: Not on file    Minutes per session: Not on file  . Stress: Not on file  Relationships  . Social Herbalist on phone: Not on file    Gets together: Not on file    Attends religious service: Not on file     Active member of club or organization: Not on file    Attends meetings of clubs or organizations: Not on file    Relationship status: Not on file  . Intimate partner violence    Fear of current or ex partner: Not on file    Emotionally abused: Not on file    Physically abused: Not on file    Forced sexual activity: Not on file  Other Topics Concern  . Not on file  Social History Narrative   No living will   No health care POA but would want son, [name], to make decisions about her care if needed. Other 2 children are alternatives   Would accept resuscitation attempts   Probably wouldn't want tube feeds if cognitively unaware   Review of Systems Some right foot swelling a while back--this is better Still doesn't sleep well Does nap daily--this refreshes her Having some vaginal yeast---rare blood tinged (from outside irritation)---will set up appt with her gyn at [name])    Objective:   Physical Exam  Constitutional: She appears well-developed. No distress.  Neck: No thyromegaly present.  Cardiovascular: Normal rate, regular rhythm, normal heart sounds and intact distal pulses. Exam reveals no gallop.  No murmur heard. Respiratory: Effort normal and breath sounds normal. No respiratory distress. She has no wheezes. She has no rales.  GI: Soft. There is no abdominal tenderness.  Musculoskeletal:        General: No tenderness or edema.  Lymphadenopathy:    She has no cervical adenopathy.  Neurological:  Mildly decreased sensation in feet  Skin:  No foot lesions  Psychiatric:  Melancholy but no overt depression           Assessment & Plan:

## 2018-10-28 NOTE — Assessment & Plan Note (Signed)
BMI 35 with diabetes, HTN, arthritis Discussed dietary plan and fitness (she is really not compliant)

## 2018-10-31 ENCOUNTER — Other Ambulatory Visit: Payer: Self-pay | Admitting: Internal Medicine

## 2018-10-31 DIAGNOSIS — E1140 Type 2 diabetes mellitus with diabetic neuropathy, unspecified: Secondary | ICD-10-CM

## 2018-10-31 DIAGNOSIS — IMO0002 Reserved for concepts with insufficient information to code with codable children: Secondary | ICD-10-CM

## 2018-10-31 LAB — PARATHYROID HORMONE, INTACT (NO CA): PTH: 38 pg/mL (ref 14–64)

## 2018-10-31 MED ORDER — DAPAGLIFLOZIN PROPANEDIOL 5 MG PO TABS: 5.0000 mg | ORAL_TABLET | Freq: Every day | ORAL | 11 refills | Status: DC

## 2018-11-02 ENCOUNTER — Telehealth: Payer: Self-pay | Admitting: *Deleted

## 2018-11-02 NOTE — Telephone Encounter (Signed)
Please check with her pharmacist if there is a preferred drug in the dapagilflozin class

## 2018-11-02 NOTE — Telephone Encounter (Signed)
Patient left a voicemail stating that she recently was started on a new diabetic medication by Dr. Silvio Pate. Patient stated that the new medication is too expensive and will cost her $220 per month and she can not afford that. Patient stated that the medication is a tier 4 with her insurance. Patient wants to know if she can be given a generic or something cheaper?

## 2018-11-03 MED ORDER — CANAGLIFLOZIN 100 MG PO TABS: 100.0000 mg | ORAL_TABLET | Freq: Every day | ORAL | 3 refills | Status: DC

## 2018-11-03 MED ORDER — VENLAFAXINE HCL 37.5 MG PO TABS: 37.5000 mg | ORAL_TABLET | Freq: Two times a day (BID) | ORAL | 3 refills | Status: DC

## 2018-11-03 NOTE — Telephone Encounter (Signed)
Looked up pt's formulary and [employer] is a Tier 3. Dr Silvio Pate said to send in [place] 100mg  daily for her after I verified it with the pt. Called pt and she had me send it to [facility]. I also sent her venlafaxine to [facility] also.

## 2019-01-24 ENCOUNTER — Other Ambulatory Visit: Payer: Self-pay | Admitting: Internal Medicine

## 2019-01-24 NOTE — Telephone Encounter (Signed)
Last filled 10-24-18 #90 Last OV 10-28-18 Next OV 05-03-19 [facility]

## 2019-01-28 ENCOUNTER — Other Ambulatory Visit: Payer: Self-pay

## 2019-01-28 MED ORDER — DOXAZOSIN MESYLATE 4 MG PO TABS: 2.0000 mg | ORAL_TABLET | Freq: Two times a day (BID) | ORAL | 3 refills | Status: DC

## 2019-01-31 ENCOUNTER — Other Ambulatory Visit (INDEPENDENT_AMBULATORY_CARE_PROVIDER_SITE_OTHER): Payer: Medicare PPO

## 2019-01-31 DIAGNOSIS — E1165 Type 2 diabetes mellitus with hyperglycemia: Secondary | ICD-10-CM | POA: Diagnosis not present

## 2019-01-31 DIAGNOSIS — E1140 Type 2 diabetes mellitus with diabetic neuropathy, unspecified: Secondary | ICD-10-CM | POA: Diagnosis not present

## 2019-01-31 DIAGNOSIS — IMO0002 Reserved for concepts with insufficient information to code with codable children: Secondary | ICD-10-CM

## 2019-01-31 LAB — HEMOGLOBIN A1C: Hgb A1c MFr Bld: 10.3 % — ABNORMAL HIGH (ref 4.6–6.5)

## 2019-02-01 ENCOUNTER — Telehealth: Payer: Self-pay

## 2019-02-01 NOTE — Telephone Encounter (Signed)
Per Dr Silvio Pate: A1c is much better but I think we should start insulin--or have her see a diabetes specialist. See if she is willing to do this

## 2019-02-10 ENCOUNTER — Other Ambulatory Visit: Payer: Self-pay | Admitting: Internal Medicine

## 2019-04-25 ENCOUNTER — Other Ambulatory Visit: Payer: Self-pay | Admitting: Internal Medicine

## 2019-04-25 NOTE — Telephone Encounter (Signed)
Last written 01-25-19 Last OV 10-28-18 Next OV 05-03-19 [facility]

## 2019-05-03 ENCOUNTER — Ambulatory Visit (INDEPENDENT_AMBULATORY_CARE_PROVIDER_SITE_OTHER): Payer: Medicare PPO | Admitting: Internal Medicine

## 2019-05-03 ENCOUNTER — Encounter: Payer: Self-pay | Admitting: Internal Medicine

## 2019-05-03 ENCOUNTER — Other Ambulatory Visit: Payer: Self-pay

## 2019-05-03 VITALS — BP 120/62 | HR 96 | Temp 97.7°F | Ht 63.0 in | Wt 196.0 lb

## 2019-05-03 DIAGNOSIS — E1165 Type 2 diabetes mellitus with hyperglycemia: Secondary | ICD-10-CM | POA: Diagnosis not present

## 2019-05-03 DIAGNOSIS — Z7189 Other specified counseling: Secondary | ICD-10-CM | POA: Diagnosis not present

## 2019-05-03 DIAGNOSIS — E1140 Type 2 diabetes mellitus with diabetic neuropathy, unspecified: Secondary | ICD-10-CM

## 2019-05-03 DIAGNOSIS — I10 Essential (primary) hypertension: Secondary | ICD-10-CM

## 2019-05-03 DIAGNOSIS — N183 Chronic kidney disease, stage 3 unspecified: Secondary | ICD-10-CM | POA: Diagnosis not present

## 2019-05-03 DIAGNOSIS — E1122 Type 2 diabetes mellitus with diabetic chronic kidney disease: Secondary | ICD-10-CM

## 2019-05-03 DIAGNOSIS — F39 Unspecified mood [affective] disorder: Secondary | ICD-10-CM

## 2019-05-03 DIAGNOSIS — IMO0002 Reserved for concepts with insufficient information to code with codable children: Secondary | ICD-10-CM

## 2019-05-03 DIAGNOSIS — Z0000 Encounter for general adult medical examination without abnormal findings: Secondary | ICD-10-CM

## 2019-05-03 LAB — POCT GLYCOSYLATED HEMOGLOBIN (HGB A1C): Hemoglobin A1C: 10.8 % — AB (ref 4.0–5.6)

## 2019-05-03 LAB — HM DIABETES FOOT EXAM

## 2019-05-03 MED ORDER — FLUCONAZOLE 150 MG PO TABS: 150.0000 mg | ORAL_TABLET | ORAL | 3 refills | Status: DC

## 2019-05-03 NOTE — Progress Notes (Signed)
Subjective:    Patient ID: [name], female    DOB: [birth year], 79 y.o.   MRN: [id number]  HPI Here for Medicare wellness visit and follow up of chronic health conditions This visit occurred during the SARS-CoV-2 public health emergency.  Safety protocols were in place, including screening questions prior to the visit, additional usage of staff PPE, and extensive cleaning of exam room while observing appropriate contact time as indicated for disinfecting solutions.   Reviewed form and advanced directives Reviewed other doctors No alcohol or tobacco No exercise--blames her feet and knees Vision is okay---due for eye exam Mild hearing issues--not a functional problem No falls No depression or anhedonia Independent with instrumental ADLs--son does vacuum (son lives with her) No sig memory issues---mostly just names  Not checking sugars Having some vaginal yeast with the invokana Doesn't think she can do injections Ongoing foot numbness and pain CKD ---GFR ~40 Feels she eats healthy most days  No chest pain No SOB No palpitations Occasional dizziness upon first arising---no syncope No edema  Mood is okay Still on venlafaxine Not really depressed--is mostly on it for fibromyalgia  Current Outpatient Medications on File Prior to Visit  Medication Sig Dispense Refill  . aspirin 81 MG tablet Take 81 mg by mouth daily.      . canagliflozin (INVOKANA) 100 MG TABS tablet Take 1 tablet (100 mg total) by mouth daily before breakfast. 90 tablet 3  . Cyanocobalamin (VITAMIN B12 PO) Take by mouth.    . doxazosin (CARDURA) 4 MG tablet Take 0.5 tablets (2 mg total) by mouth 2 (two) times daily. 90 tablet 3  . FeFum-FePoly-FA-B Cmp-C-Biot (INTEGRA PLUS) CAPS Take 1 capsule by mouth every morning. 30 capsule 2  . gabapentin (NEURONTIN) 100 MG capsule TAKE 1 CAPSULE TWICE DAILY 180 capsule 3  . glipiZIDE (GLUCOTROL XL) 10 MG 24 hr tablet TAKE 1 TABLET TWICE DAILY 180 tablet 3  .  Glucosamine-Chondroitin (COSAMIN DS PO) Take by mouth.    . losartan-hydrochlorothiazide (HYZAAR) 100-25 MG tablet TAKE 1 TABLET EVERY DAY 90 tablet 3  . metFORMIN (GLUCOPHAGE-XR) 750 MG 24 hr tablet TAKE 2 TABLETS EVERY DAY WITH BREAKFAST 180 tablet 3  . Multiple Vitamin (MULTIVITAMIN) capsule Take 1 capsule by mouth daily.    . Multiple Vitamin (MULTIVITAMIN) tablet Take 1 tablet by mouth daily.    Marland Kitchen OVER THE COUNTER MEDICATION Knock Out All Natural Sleep Aid    . tiZANidine (ZANAFLEX) 4 MG tablet TAKE 1 TABLET AT BEDTIME 90 tablet 0  . venlafaxine (EFFEXOR) 37.5 MG tablet Take 1 tablet (37.5 mg total) by mouth 2 (two) times daily. 180 tablet 3   No current facility-administered medications on file prior to visit.    Allergies  Allergen Reactions  . Diphenhydramine Hcl Other (See Comments)    hallucinations   . Fluoxetine Hcl     REACTION: raised blood pressure  . Lisinopril     REACTION: dizzy  . Sertraline Hcl     REACTION: dizziness    Past Medical History:  Diagnosis Date  . Allergic rhinitis   . Anal fissure   . Arthritis   . Asthma   . Depression   . Diabetes mellitus    type 2  . Diverticulosis   . Fibromyalgia   . GERD (gastroesophageal reflux disease)   . Hypertension   . Obesity   . Osteoarthritis     Past Surgical History:  Procedure Laterality Date  . ABDOMINAL HYSTERECTOMY  1970's  .  CATARACT EXTRACTION, BILATERAL    . CHOLECYSTECTOMY  4/06  . TUBAL LIGATION  1970's  . VENTRAL HERNIA REPAIR  9/08   [facility]    Family History  Problem Relation Age of Onset  . Heart attack Father   . Coronary artery disease Father   . Kidney cancer Mother   . Lung cancer Brother   . Liver cancer Brother   . Diabetes Other        Maternal side  . Brain cancer Daughter   . Hypertension Neg Hx   . Breast cancer Neg Hx   . Colon cancer Neg Hx     Social History   Socioeconomic History  . Marital status: Divorced    Spouse name: Not on file  . Number of  children: 3  . Years of education: Not on file  . Highest education level: Not on file  Occupational History  . Occupation: Scientist, research (physical sciences): RETIRED  Tobacco Use  . Smoking status: Never Smoker  . Smokeless tobacco: Never Used  Substance and Sexual Activity  . Alcohol use: No    Alcohol/week: 0.0 standard drinks  . Drug use: No  . Sexual activity: Not on file  Other Topics Concern  . Not on file  Social History Narrative   No living will   No health care POA but would want son, [name], to make decisions about her care if needed. Other child are alternatives   Would accept resuscitation attempts   Probably wouldn't want tube feeds if cognitively unaware   Social Determinants of Health   Financial Resource Strain:   . Difficulty of Paying Living Expenses:   Food Insecurity:   . Worried About Charity fundraiser in the Last Year:   . Arboriculturist in the Last Year:   Transportation Needs:   . Film/video editor (Medical):   Marland Kitchen Lack of Transportation (Non-Medical):   Physical Activity:   . Days of Exercise per Week:   . Minutes of Exercise per Session:   Stress:   . Feeling of Stress :   Social Connections:   . Frequency of Communication with Friends and Family:   . Frequency of Social Gatherings with Friends and Family:   . Attends Religious Services:   . Active Member of Clubs or Organizations:   . Attends Archivist Meetings:   Marland Kitchen Marital Status:   Intimate Partner Violence:   . Fear of Current or Ex-Partner:   . Emotionally Abused:   Marland Kitchen Physically Abused:   . Sexually Abused:    Review of Systems Appetite is okay Weight down a little Doesn't sleep well--taking OTC "knock out"---melatonin and herbs Wears seat belt Mostly dentures--doesn't see dentist Has some dry spots on face--stable small hyperpigmented spot on left temple No heartburn or dysphagia Bowels are fine--no blood Urinary frequency--no pain. Urgency but no incontinence Knee,  foot, slight back pain    Objective:   Physical Exam  Constitutional: She is oriented to person, place, and time. She appears well-developed. No distress.  HENT:  No oral lesions Upper full and lower partial plates  Neck: No thyromegaly present.  Cardiovascular: Normal rate, regular rhythm, normal heart sounds and intact distal pulses. Exam reveals no gallop.  No murmur heard. Respiratory: Effort normal and breath sounds normal. No respiratory distress. She has no wheezes. She has no rales.  GI: Soft. There is no abdominal tenderness.  Musculoskeletal:        General: No  tenderness or edema.  Lymphadenopathy:    She has no cervical adenopathy.  Neurological: She is alert and oriented to person, place, and time.  President--- "Biden, Trump, Obama" 100-94- "I'm not good at math" D-l-o-r-w Recall 2/3 (pen, class, desk)  Decreased sensation in feet  Skin:  No foot lesions  Psychiatric: She has a normal mood and affect. Her behavior is normal.           Assessment & Plan:

## 2019-05-03 NOTE — Assessment & Plan Note (Signed)
Is on ARB °

## 2019-05-03 NOTE — Assessment & Plan Note (Signed)
See social history 

## 2019-05-03 NOTE — Assessment & Plan Note (Signed)
BP Readings from Last 3 Encounters:  05/03/19 120/62  10/28/18 138/82  09/27/17 124/84   Good control

## 2019-05-03 NOTE — Assessment & Plan Note (Signed)
Lab Results  Component Value Date   HGBA1C 10.8 (A) 05/03/2019   Worse than before Discussed alternatives---she needs consultation with endocrinologist Fluconazole weekly for the yeast from invokana

## 2019-05-03 NOTE — Assessment & Plan Note (Signed)
Mostly anxiety Related to fibromyalgia also Is on the venlafaxine

## 2019-05-03 NOTE — Progress Notes (Signed)
Hearing Screening   Method: Audiometry   125Hz  250Hz  500Hz  1000Hz  2000Hz  3000Hz  4000Hz  6000Hz  8000Hz   Right ear:   40 40 40  0    Left ear:   25 25 25   40      Visual Acuity Screening   Right eye Left eye Both eyes  Without correction:     With correction: 20/40 20/50 20/30

## 2019-05-03 NOTE — Assessment & Plan Note (Signed)
I have personally reviewed the Medicare Annual Wellness questionnaire and have noted 1. The patient's medical and social history 2. Their use of alcohol, tobacco or illicit drugs 3. Their current medications and supplements 4. The patient's functional ability including ADL's, fall risks, home safety risks and hearing or visual             impairment. 5. Diet and physical activities 6. Evidence for depression or mood disorders  The patients weight, height, BMI and visual acuity have been recorded in the chart I have made referrals, counseling and provided education to the patient based review of the above and I have provided the pt with a written personalized care plan for preventive services.  I have provided you with a copy of your personalized plan for preventive services. Please take the time to review along with your updated medication list.  Discussed cancer screening--will hold off Getting COVID vaccine--then will get second shingrix later Yearly flu vaccine in the fall Discussed lifestyle Td if any injury

## 2019-06-28 ENCOUNTER — Other Ambulatory Visit: Payer: Self-pay | Admitting: Internal Medicine

## 2019-08-02 DIAGNOSIS — E785 Hyperlipidemia, unspecified: Secondary | ICD-10-CM | POA: Diagnosis not present

## 2019-08-02 DIAGNOSIS — E1142 Type 2 diabetes mellitus with diabetic polyneuropathy: Secondary | ICD-10-CM | POA: Diagnosis not present

## 2019-08-02 DIAGNOSIS — I152 Hypertension secondary to endocrine disorders: Secondary | ICD-10-CM | POA: Diagnosis not present

## 2019-08-02 DIAGNOSIS — E1159 Type 2 diabetes mellitus with other circulatory complications: Secondary | ICD-10-CM | POA: Diagnosis not present

## 2019-08-07 ENCOUNTER — Other Ambulatory Visit: Payer: Self-pay | Admitting: Internal Medicine

## 2019-08-17 DIAGNOSIS — M7918 Myalgia, other site: Secondary | ICD-10-CM | POA: Diagnosis not present

## 2019-11-02 ENCOUNTER — Telehealth: Payer: Self-pay

## 2019-11-02 NOTE — Telephone Encounter (Signed)
[facility] - Client TELEPHONE ADVICE RECORD [facility] Patient Name: [name] Gender: Female DOB: 05/05/1951 Age: 79 Y 91 M 27 D Return Phone Number: [phone] (Primary), [phone] (Secondary) Address: City/State/Zip: [place] [address] Client [facility] - Client Client Site [facility] - Day Physician [name]- MD Contact Type Call Who Is Calling Patient / Member / Family / Caregiver Call Type Triage / Clinical Relationship To Patient Self Return Phone Number [phone] (Primary) Chief Complaint Hives Reason for Call Symptom or clinical information Initial Comment Caller states she has a widespread rash and some swelling behind her ear due to taking Oxcarbazepine. Her medication was recently increased and swelling has worsened. Translation No Nurse Assessment Nurse: Vallery Sa, RN, Cathy Date/Time (Eastern Time): 11/02/2019 9:25:47 AM Confirm and document reason for call. If symptomatic, describe symptoms. ---[name] states that her Oxcarbazepine was increased several weeks ago and that she developed widespread Hives about 8-10 days ago. No severe breathing or swallowing difficulty. No wheezing. No fever. She developed a red bump behind her ear last night. No pain. Alert and responsive. Does the patient have any new or worsening symptoms? ---Yes Will a triage be completed? ---Yes Related visit to physician within the last 2 weeks? ---Yes Does the PT have any chronic conditions? (i.e. diabetes, asthma, this includes High risk factors for pregnancy, etc.) ---Yes List chronic conditions. ---Asthma, High Blood Pressure, Headaches Is this a behavioral health or substance abuse call? ---No Guidelines Guideline Title Affirmed Question Affirmed Notes Nurse Date/Time (Eastern Time) Hives [1] MODERATESEVERE hives persist (i.e., hives interfere with normal activities or work) AND [2]  taking antihistamine (e.g., Benadryl, Claritin) > 24 hours Vallery Sa, RN, [name] 11/02/2019 9:30:19 AM PLEASE NOTE: All timestamps contained within this report are represented as [place] Standard Time. CONFIDENTIALTY NOTICE: This fax transmission is intended only for the addressee. It contains information that is legally privileged, confidential or otherwise protected from use or disclosure. If you are not the intended recipient, you are strictly prohibited from reviewing, disclosing, copying using or disseminating any of this information or taking any action in reliance on or regarding this information. If you have received this fax in error, please notify us immediately by telephone so that we can arrange for its return to [place]. Phone: [phone], Toll-Free: [phone], Fax: [phone] Page: 2 of 2 Call Id: [id number] Guidelines Guideline Title Affirmed Question Affirmed Notes Nurse Date/Time Eilene Ghazi Time) Lymph Nodes - Swollen [1] Single large node AND [2] size > 1 inch (2.5 cm) AND [3] no fever Vallery Sa, RN, [name] 11/02/2019 9:33:07 AM Disp. Time Eilene Ghazi Time) Disposition Final User 11/02/2019 9:32:34 AM See PCP within 24 Hours [place], RN, [name] 11/02/2019 9:34:59 AM See PCP within 24 Hours Yes Tr[place]ull, RN, [name] Caller Disagree/Comply Comply Caller Understands Yes PreDisposition Call Doctor Care Advice Given Per Guideline SEE PCP WITHIN 24 HOURS: * IF OFFICE WILL BE OPEN: You need to be examined within the next 24 hours. Call your doctor (or NP/PA) when the office opens and make an appointment. ANTIHISTAMINE MEDICINES FOR WIDESPREAD HIVES: * For widespread hives, take either cetirizine or loratadine. They can help reduce the rash, itching, and other allergic symptoms. * CETIRIZINE (REACTINE, ZYRTEC): The adult dose is 10 mg and you take it once a day. Cetirizine is available in the [place] as Zyrtec and in [place] as Reactine. * LORATADINE (ALAVERT, CLARITIN): The adult  dose is 10 mg and you take it once a day.  Loratadine is available in the [place] as Engineer, maintenance (IT); it is available in [place] as Claritin. * They are over-the-counter (OTC) antihistamine medicines. You can buy them at the drugstore. * Diphenhydramine (Benadryl) is a FIRST GENERATION ANTIHISTAMINE medicine. It causes more sleepiness than the newer second generation antihistamine medicines. The adult dosage of Benadryl is 25 to 50 mg by mouth and you can take it up to 4 times a day. ANTIHISTAMINE MEDICINES - EXTRA NOTES AND WARNINGS: * Antihistamine medicines can be used to treat allergic reactions, allergies, hay fever, hives, and itching. COOL BATH FOR ITCHING: * Take a cool bath for 10 minutes to relieve itching. (Caution: avoid any chill). * Rub very itchy areas with an ice cube for 10 minutes. PREVENTION - REMOVE ALLERGENS: * Take a bath or shower if triggered by pollens or animal contact. * Change clothes. CALL BACK IF: * You become worse CARE ADVICE given per Hives (Adult) guideline. SEE PCP WITHIN 24 HOURS: * IF OFFICE WILL BE OPEN: You need to be examined within the next 24 hours. Call your doctor (or NP/PA) when the office opens and make an appointment. PAIN MEDICINES: * ACETAMINOPHEN - REGULAR STRENGTH TYLENOL: Take 650 mg (two 325 mg pills) by mouth every 4 to 6 hours as needed. Each Regular Strength Tylenol pill has 325 mg of acetaminophen. The most you should take each day is 3,250 mg (10 pills a day). CALL BACK IF: * You become worse CARE ADVICE given per Lymph Nodes Swollen (Adult) guideline. Comments User: Berton Mount, RN Date/Time Eilene Ghazi Time): 11/02/2019 9:37:10 AM Oxcarbazepine side effects: Seek medical treatment if you have a serious drug reaction that can affect many parts of your body. Symptoms may include: skin rash, swollen glands. (Her next dose is due at 5pm. She will seek futher medications directions from MD.) Referrals REFERRED TO PCP OFFICE

## 2019-11-02 NOTE — Telephone Encounter (Signed)
Per appt note pt already has appt 11/03/19 at 12:30 with Dr Silvio Pate.

## 2019-11-02 NOTE — Telephone Encounter (Signed)
Okay  I will assess her at the visit and see if we need to change her medication

## 2019-11-03 ENCOUNTER — Encounter: Payer: Self-pay | Admitting: Internal Medicine

## 2019-11-03 ENCOUNTER — Other Ambulatory Visit: Payer: Self-pay

## 2019-11-03 ENCOUNTER — Ambulatory Visit: Payer: Medicare PPO | Admitting: Internal Medicine

## 2019-11-03 VITALS — BP 142/80 | HR 103 | Temp 97.4°F | Wt 196.8 lb

## 2019-11-03 DIAGNOSIS — Z23 Encounter for immunization: Secondary | ICD-10-CM | POA: Diagnosis not present

## 2019-11-03 DIAGNOSIS — E1165 Type 2 diabetes mellitus with hyperglycemia: Secondary | ICD-10-CM

## 2019-11-03 DIAGNOSIS — IMO0002 Reserved for concepts with insufficient information to code with codable children: Secondary | ICD-10-CM

## 2019-11-03 DIAGNOSIS — E1140 Type 2 diabetes mellitus with diabetic neuropathy, unspecified: Secondary | ICD-10-CM

## 2019-11-03 LAB — POCT GLYCOSYLATED HEMOGLOBIN (HGB A1C): Hemoglobin A1C: 9.7 % — AB (ref 4.0–5.6)

## 2019-11-03 MED ORDER — TIZANIDINE HCL 4 MG PO TABS: 4.0000 mg | ORAL_TABLET | Freq: Every day | ORAL | 0 refills | Status: DC

## 2019-11-03 MED ORDER — GABAPENTIN 100 MG PO CAPS: 200.0000 mg | ORAL_CAPSULE | Freq: Every day | ORAL | 0 refills | Status: DC

## 2019-11-03 NOTE — Assessment & Plan Note (Signed)
Lab Results  Component Value Date   HGBA1C 9.7 (A) 11/03/2019   Slightly better---but only on the semaglutide for a month Off the invokana now Still on glipizide and metformin (10 bid and 1500 in AM) Will leave changes to Dr Glenice Laine should try increasing semaglutide to 7mg --but she has had some diarrhea Will increase the gabapentin to 200mg  at bedtime--we can increase more if needed

## 2019-11-03 NOTE — Progress Notes (Signed)
Subjective:    Patient ID: [name], female    DOB: 1940-03-19, 79 y.o.   MRN: [id number]  HPI Here for follow up of uncontrolled diabetes This visit occurred during the SARS-CoV-2 public health emergency.  Safety protocols were in place, including screening questions prior to the visit, additional usage of staff PPE, and extensive cleaning of exam room while observing appropriate contact time as indicated for disinfecting solutions.   Had a visit with the endocrinologist Only started the semaglutide a month ago Did have some initial nausea--no problems since then Still on glipizide 10 bid and metformin 750 --two each morning Stopped the invokana due to cost  Not checking sugars---but she feels better Gabapentin is not clearly helping her neuropathy pain--only taking 100mg  at bedtime  Current Outpatient Medications on File Prior to Visit  Medication Sig Dispense Refill  . aspirin 81 MG tablet Take 81 mg by mouth daily.      . Cyanocobalamin (VITAMIN B12 PO) Take by mouth.    . doxazosin (CARDURA) 4 MG tablet Take 0.5 tablets (2 mg total) by mouth 2 (two) times daily. 90 tablet 3  . FeFum-FePoly-FA-B Cmp-C-Biot (INTEGRA PLUS) CAPS Take 1 capsule by mouth every morning. 30 capsule 2  . fluconazole (DIFLUCAN) 150 MG tablet Take 1 tablet (150 mg total) by mouth once a week. 12 tablet 3  . gabapentin (NEURONTIN) 100 MG capsule TAKE 1 CAPSULE TWICE DAILY 180 capsule 3  . glipiZIDE (GLUCOTROL XL) 10 MG 24 hr tablet TAKE 1 TABLET TWICE DAILY 180 tablet 3  . Glucosamine-Chondroitin (COSAMIN DS PO) Take by mouth.    . losartan-hydrochlorothiazide (HYZAAR) 100-25 MG tablet TAKE 1 TABLET EVERY DAY 90 tablet 3  . metFORMIN (GLUCOPHAGE-XR) 750 MG 24 hr tablet TAKE 2 TABLETS EVERY DAY WITH BREAKFAST 180 tablet 3  . Multiple Vitamin (MULTIVITAMIN) capsule Take 1 capsule by mouth daily.    . Multiple Vitamin (MULTIVITAMIN) tablet Take 1 tablet by mouth daily.    Marland Kitchen OVER THE COUNTER MEDICATION  Knock Out All Natural Sleep Aid    . Semaglutide (RYBELSUS) 3 MG TABS Take 3 mg by mouth daily.    Marland Kitchen tiZANidine (ZANAFLEX) 4 MG tablet TAKE 1 TABLET AT BEDTIME 90 tablet 0  . venlafaxine (EFFEXOR) 37.5 MG tablet Take 1 tablet (37.5 mg total) by mouth 2 (two) times daily. 180 tablet 3   No current facility-administered medications on file prior to visit.    Allergies  Allergen Reactions  . Diphenhydramine Hcl Other (See Comments)    hallucinations   . Fluoxetine Hcl     REACTION: raised blood pressure  . Lisinopril     REACTION: dizzy  . Sertraline Hcl     REACTION: dizziness    Past Medical History:  Diagnosis Date  . Allergic rhinitis   . Anal fissure   . Arthritis   . Asthma   . Depression   . Diabetes mellitus    type 2  . Diverticulosis   . Fibromyalgia   . GERD (gastroesophageal reflux disease)   . Hypertension   . Obesity   . Osteoarthritis     Past Surgical History:  Procedure Laterality Date  . ABDOMINAL HYSTERECTOMY  1970's  . CATARACT EXTRACTION, BILATERAL    . CHOLECYSTECTOMY  4/06  . TUBAL LIGATION  1970's  . VENTRAL HERNIA REPAIR  9/08   [facility]    Family History  Problem Relation Age of Onset  . Heart attack Father   . Coronary artery  disease Father   . Kidney cancer Mother   . Lung cancer Brother   . Liver cancer Brother   . Diabetes Other        Maternal side  . Brain cancer Daughter   . Hypertension Neg Hx   . Breast cancer Neg Hx   . Colon cancer Neg Hx     Social History   Socioeconomic History  . Marital status: Divorced    Spouse name: Not on file  . Number of children: 3  . Years of education: Not on file  . Highest education level: Not on file  Occupational History  . Occupation: Scientist, research (physical sciences): RETIRED  Tobacco Use  . Smoking status: Never Smoker  . Smokeless tobacco: Never Used  Vaping Use  . Vaping Use: Never used  Substance and Sexual Activity  . Alcohol use: No    Alcohol/week: 0.0 standard drinks  .  Drug use: No  . Sexual activity: Not on file  Other Topics Concern  . Not on file  Social History Narrative   No living will   No health care POA but would want son, [name], to make decisions about her care if needed. Other child are alternatives   Would accept resuscitation attempts   Probably wouldn't want tube feeds if cognitively unaware   Social Determinants of Health   Financial Resource Strain:   . Difficulty of Paying Living Expenses: Not on file  Food Insecurity:   . Worried About Charity fundraiser in the Last Year: Not on file  . Ran Out of Food in the Last Year: Not on file  Transportation Needs:   . Lack of Transportation (Medical): Not on file  . Lack of Transportation (Non-Medical): Not on file  Physical Activity:   . Days of Exercise per Week: Not on file  . Minutes of Exercise per Session: Not on file  Stress:   . Feeling of Stress : Not on file  Social Connections:   . Frequency of Communication with Friends and Family: Not on file  . Frequency of Social Gatherings with Friends and Family: Not on file  . Attends Religious Services: Not on file  . Active Member of Clubs or Organizations: Not on file  . Attends Archivist Meetings: Not on file  . Marital Status: Not on file  Intimate Partner Violence:   . Fear of Current or Ex-Partner: Not on file  . Emotionally Abused: Not on file  . Physically Abused: Not on file  . Sexually Abused: Not on file   Review of Systems  Some recent right foot swelling Sleeps okay with OTC "Kncok out"--mostly melatonin Appetite is still fine Weight is stable Had lots of diarrhea---but before the semaglutide. Still loose but not as bad     Objective:   Physical Exam Constitutional:      Appearance: Normal appearance.  Cardiovascular:     Rate and Rhythm: Normal rate and regular rhythm.     Pulses: Normal pulses.     Heart sounds: No murmur heard.  No gallop.   Pulmonary:     Effort: Pulmonary effort is  normal.     Breath sounds: Normal breath sounds. No wheezing or rales.  Abdominal:     Palpations: Abdomen is soft.     Tenderness: There is no abdominal tenderness.  Musculoskeletal:     Cervical back: Neck supple.     Comments: Calf fullness but no true edema  Lymphadenopathy:  Cervical: No cervical adenopathy.  Skin:    Comments: No foot lesions  Neurological:     Mental Status: She is alert.  Psychiatric:        Mood and Affect: Mood normal.        Behavior: Behavior normal.            Assessment & Plan:

## 2019-11-07 ENCOUNTER — Encounter: Payer: Self-pay | Admitting: Internal Medicine

## 2019-11-07 ENCOUNTER — Other Ambulatory Visit: Payer: Self-pay

## 2019-11-07 ENCOUNTER — Ambulatory Visit (INDEPENDENT_AMBULATORY_CARE_PROVIDER_SITE_OTHER): Payer: Medicare PPO | Admitting: Internal Medicine

## 2019-11-07 DIAGNOSIS — M1712 Unilateral primary osteoarthritis, left knee: Secondary | ICD-10-CM

## 2019-11-07 MED ORDER — METHYLPREDNISOLONE ACETATE 40 MG/ML IJ SUSP
40.0000 mg | Freq: Once | INTRAMUSCULAR | Status: AC
  Administered 2019-11-07: 40 mg via INTRA_ARTICULAR

## 2019-11-07 MED ORDER — METHYLPREDNISOLONE ACETATE 40 MG/ML IJ SUSP
80.0000 mg | Freq: Once | INTRAMUSCULAR | Status: AC
  Administered 2019-11-07: 80 mg via INTRA_ARTICULAR

## 2019-11-07 NOTE — Assessment & Plan Note (Signed)
Pros and cons of cortisone injection discussed I will only do one today--due to the poor diabetes control---left is worse Verbal consent  Sterile prep Ethyl chloride then 2cc 1% lidocaine topical 40 mg depomedrol and 4cc 1% lidocaine instilled easily Tolerated well Discussed home care  Consider doing the right knee when sugars are better

## 2019-11-07 NOTE — Addendum Note (Signed)
Addended by: Pilar Grammes on: 11/07/2019 01:12 PM   Modules accepted: Orders

## 2019-11-07 NOTE — Progress Notes (Signed)
   Subjective:    Patient ID: [name], female    DOB: Jun 24, 1940, 79 y.o.   MRN: [id number]  HPI Here for left knee injection This visit occurred during the SARS-CoV-2 public health emergency.  Safety protocols were in place, including screening questions prior to the visit, additional usage of staff PPE, and extensive cleaning of exam room while observing appropriate contact time as indicated for disinfecting solutions.      Review of Systems     Objective:   Physical Exam Musculoskeletal:     Comments: No significant left knee effusion            Assessment & Plan:

## 2019-11-22 DIAGNOSIS — E119 Type 2 diabetes mellitus without complications: Secondary | ICD-10-CM | POA: Diagnosis not present

## 2019-11-22 DIAGNOSIS — I152 Hypertension secondary to endocrine disorders: Secondary | ICD-10-CM | POA: Diagnosis not present

## 2019-11-22 DIAGNOSIS — E1159 Type 2 diabetes mellitus with other circulatory complications: Secondary | ICD-10-CM | POA: Diagnosis not present

## 2019-11-22 DIAGNOSIS — E1169 Type 2 diabetes mellitus with other specified complication: Secondary | ICD-10-CM | POA: Diagnosis not present

## 2019-11-22 DIAGNOSIS — E1142 Type 2 diabetes mellitus with diabetic polyneuropathy: Secondary | ICD-10-CM | POA: Diagnosis not present

## 2019-11-22 DIAGNOSIS — E785 Hyperlipidemia, unspecified: Secondary | ICD-10-CM | POA: Diagnosis not present

## 2019-12-12 ENCOUNTER — Other Ambulatory Visit: Payer: Self-pay | Admitting: Internal Medicine

## 2020-01-17 ENCOUNTER — Other Ambulatory Visit: Payer: Self-pay | Admitting: Internal Medicine

## 2020-01-18 NOTE — Telephone Encounter (Signed)
Last filled 11-03-19 #90/0 Last OV 11-07-19 Next OV 05-07-20 [facility] Mail Order

## 2020-02-05 ENCOUNTER — Other Ambulatory Visit: Payer: Self-pay | Admitting: Family Medicine

## 2020-02-05 ENCOUNTER — Encounter: Payer: Self-pay | Admitting: Family Medicine

## 2020-02-05 ENCOUNTER — Other Ambulatory Visit: Payer: Self-pay

## 2020-02-05 ENCOUNTER — Telehealth (INDEPENDENT_AMBULATORY_CARE_PROVIDER_SITE_OTHER): Payer: Medicare PPO | Admitting: Family Medicine

## 2020-02-05 ENCOUNTER — Other Ambulatory Visit (INDEPENDENT_AMBULATORY_CARE_PROVIDER_SITE_OTHER): Payer: Medicare PPO

## 2020-02-05 VITALS — Ht 63.0 in

## 2020-02-05 DIAGNOSIS — R059 Cough, unspecified: Secondary | ICD-10-CM | POA: Diagnosis not present

## 2020-02-05 DIAGNOSIS — R0989 Other specified symptoms and signs involving the circulatory and respiratory systems: Secondary | ICD-10-CM | POA: Diagnosis not present

## 2020-02-05 DIAGNOSIS — R439 Unspecified disturbances of smell and taste: Secondary | ICD-10-CM

## 2020-02-05 DIAGNOSIS — J3489 Other specified disorders of nose and nasal sinuses: Secondary | ICD-10-CM

## 2020-02-05 MED ORDER — DOXYCYCLINE HYCLATE 100 MG PO TABS: 100.0000 mg | ORAL_TABLET | Freq: Two times a day (BID) | ORAL | 0 refills | Status: AC

## 2020-02-05 MED ORDER — GUAIFENESIN-CODEINE 100-10 MG/5ML PO SOLN: 5.0000 mL | Freq: Four times a day (QID) | ORAL | 0 refills | Status: AC | PRN

## 2020-02-05 NOTE — Progress Notes (Signed)
Appointment scheduled today at 3:00 pm for Covid testing.

## 2020-02-05 NOTE — Progress Notes (Signed)
Check for covid testing

## 2020-02-05 NOTE — Progress Notes (Signed)
Cannon Arreola T. Jeffory Snelgrove, MD Primary Care and Sports Medicine [facility] [place], [address] Phone: [phone]   FAX: [phone]  BEMNET OKERSTROM - 79 y.o. female   MRN [id number]   Date of Birth: 1940-11-12  Visit Date: 02/05/2020   PCP: Venia Carbon, MD   Referred by: Venia Carbon, MD Chief Complaint  Patient presents with   Sinusitis   Cough   Nasal Congestion   Watery Eyes   Headache   Loss of Smell and Taste   Virtual Visit via Video Note:  I connected with  [name] on 02/05/2020 11:00 AM EST by a video enabled telemedicine application and verified that I am speaking with the correct person using two identifiers.   Location patient: home computer, tablet, or smartphone Location provider: work or home office Consent: Verbal consent directly obtained from [name]. Persons participating in the virtual visit: patient, provider  I discussed the limitations of evaluation and management by telemedicine and the availability of in person appointments. The patient expressed understanding and agreed to proceed.  History of Present Illness:  Has a lot of sinus drainage.  Bad cough.  Coughing up a lot of sputum.  HA and eye pain.  Drainage.  She has no known sick contacts.  No nausea, vomiting, diarrhea.  Her son is also sick.  No fever.  Has been feeling sick for about 3 days.  Can't taste or smell much.  She did do home Covid testing less than 24 hours after onset of symptoms.   Immunization History  Administered Date(s) Administered   Fluad Quad(high Dose 65+) 10/28/2018, 11/03/2019   Influenza Split 12/25/2010   Influenza Whole 12/13/2006, 11/18/2007, 11/13/2008, 10/17/2009   Influenza,inj,Quad PF,6+ Mos 12/09/2012, 12/05/2013, 01/09/2015, 01/13/2016, 12/25/2016   Influenza-Unspecified 12/10/2017   PFIZER SARS-COV-2 Vaccination 05/05/2019, 05/26/2019   Pneumococcal Conjugate-13  12/05/2013   Pneumococcal Polysaccharide-23 07/12/2015   Td 04/08/2005   Zoster 08/27/2010, 07/25/2014     Review of Systems as above: See pertinent positives and pertinent negatives per HPI No acute distress verbally   Observations/Objective/Exam:  An attempt was made to discern vital signs over the phone and per patient if applicable and possible.   General:    Alert, Oriented, appears well and in no acute distress  Pulmonary:     On inspection no signs of respiratory distress.  Psych / Neurological:     Pleasant and cooperative.  Assessment and Plan:    ICD-10-CM   1. Cough  R05.9   2. Chest congestion  R09.89   3. Sinus pain  J34.89   4. Decreased taste and smell  R43.9    With loss of taste and smell, worried that she could have COVID-19.  Thankfully she has been immunized.  Gave her some Robitussin-AC to help with her nighttime cough.  I did 79 with other medical problems, I am going to give her some doxycycline as well.  If she does a COVID-19, then there is no reason to take this.  I discussed the assessment and treatment plan with the patient. The patient was provided an opportunity to ask questions and all were answered. The patient agreed with the plan and demonstrated an understanding of the instructions.   The patient was advised to call back or seek an in-person evaluation if the symptoms worsen or if the condition fails to improve as anticipated.  Follow-up: prn unless noted otherwise below No  follow-ups on file.  Meds ordered this encounter  Medications   doxycycline (VIBRA-TABS) 100 MG tablet    Sig: Take 1 tablet (100 mg total) by mouth 2 (two) times daily for 10 days.    Dispense:  20 tablet    Refill:  0   guaiFENesin-codeine 100-10 MG/5ML syrup    Sig: Take 5 mLs by mouth every 6 (six) hours as needed for up to 5 days for cough.    Dispense:  100 mL    Refill:  0   No orders of the defined types were placed in this  encounter.   Signed,  Elpidio Galea. Yaritsa Savarino, MD

## 2020-02-07 LAB — SARS-COV-2, NAA 2 DAY TAT

## 2020-02-07 LAB — NOVEL CORONAVIRUS, NAA: SARS-CoV-2, NAA: NOT DETECTED

## 2020-03-26 DIAGNOSIS — E785 Hyperlipidemia, unspecified: Secondary | ICD-10-CM | POA: Diagnosis not present

## 2020-03-26 DIAGNOSIS — I152 Hypertension secondary to endocrine disorders: Secondary | ICD-10-CM | POA: Diagnosis not present

## 2020-03-26 DIAGNOSIS — E1142 Type 2 diabetes mellitus with diabetic polyneuropathy: Secondary | ICD-10-CM | POA: Diagnosis not present

## 2020-03-26 DIAGNOSIS — E1159 Type 2 diabetes mellitus with other circulatory complications: Secondary | ICD-10-CM | POA: Diagnosis not present

## 2020-03-26 DIAGNOSIS — E1169 Type 2 diabetes mellitus with other specified complication: Secondary | ICD-10-CM | POA: Diagnosis not present

## 2020-05-07 ENCOUNTER — Ambulatory Visit: Payer: Medicare PPO | Admitting: Internal Medicine

## 2020-05-07 ENCOUNTER — Encounter: Payer: Self-pay | Admitting: Internal Medicine

## 2020-05-07 ENCOUNTER — Other Ambulatory Visit: Payer: Self-pay

## 2020-05-07 ENCOUNTER — Ambulatory Visit (INDEPENDENT_AMBULATORY_CARE_PROVIDER_SITE_OTHER): Payer: Medicare PPO | Admitting: Internal Medicine

## 2020-05-07 VITALS — BP 128/80 | HR 92 | Temp 97.8°F | Ht 63.0 in | Wt 198.0 lb

## 2020-05-07 DIAGNOSIS — E1140 Type 2 diabetes mellitus with diabetic neuropathy, unspecified: Secondary | ICD-10-CM

## 2020-05-07 DIAGNOSIS — IMO0002 Reserved for concepts with insufficient information to code with codable children: Secondary | ICD-10-CM

## 2020-05-07 DIAGNOSIS — I10 Essential (primary) hypertension: Secondary | ICD-10-CM | POA: Diagnosis not present

## 2020-05-07 DIAGNOSIS — Z7189 Other specified counseling: Secondary | ICD-10-CM | POA: Diagnosis not present

## 2020-05-07 DIAGNOSIS — N1832 Chronic kidney disease, stage 3b: Secondary | ICD-10-CM | POA: Diagnosis not present

## 2020-05-07 DIAGNOSIS — F39 Unspecified mood [affective] disorder: Secondary | ICD-10-CM

## 2020-05-07 DIAGNOSIS — Z0000 Encounter for general adult medical examination without abnormal findings: Secondary | ICD-10-CM | POA: Diagnosis not present

## 2020-05-07 DIAGNOSIS — E1165 Type 2 diabetes mellitus with hyperglycemia: Secondary | ICD-10-CM

## 2020-05-07 LAB — VITAMIN D 25 HYDROXY (VIT D DEFICIENCY, FRACTURES): VITD: 53.88 ng/mL (ref 30.00–100.00)

## 2020-05-07 LAB — LIPID PANEL
Cholesterol: 136 mg/dL (ref 0–200)
HDL: 52.7 mg/dL (ref >39.00)
LDL Cholesterol: 65 mg/dL (ref 0–99)
NonHDL: 83.15
Total CHOL/HDL Ratio: 3
Triglycerides: 89 mg/dL (ref 0.0–149.0)
VLDL: 17.8 mg/dL (ref 0.0–40.0)

## 2020-05-07 LAB — HEPATIC FUNCTION PANEL
ALT: 21 U/L (ref 0–35)
AST: 21 U/L (ref 0–37)
Albumin: 3.8 g/dL (ref 3.5–5.2)
Alkaline Phosphatase: 76 U/L (ref 39–117)
Bilirubin, Direct: 0.1 mg/dL (ref 0.0–0.3)
Total Bilirubin: 0.4 mg/dL (ref 0.2–1.2)
Total Protein: 7.2 g/dL (ref 6.0–8.3)

## 2020-05-07 LAB — RENAL FUNCTION PANEL
Albumin: 3.8 g/dL (ref 3.5–5.2)
BUN: 19 mg/dL (ref 6–23)
CO2: 28 mEq/L (ref 19–32)
Calcium: 9 mg/dL (ref 8.4–10.5)
Chloride: 100 mEq/L (ref 96–112)
Creatinine, Ser: 1.17 mg/dL (ref 0.40–1.20)
GFR: 44.64 mL/min — ABNORMAL LOW (ref >60.00)
Glucose, Bld: 254 mg/dL — ABNORMAL HIGH (ref 70–99)
Phosphorus: 3.4 mg/dL (ref 2.3–4.6)
Potassium: 4.6 mEq/L (ref 3.5–5.1)
Sodium: 136 mEq/L (ref 135–145)

## 2020-05-07 LAB — CBC
HCT: 32 % — ABNORMAL LOW (ref 36.0–46.0)
Hemoglobin: 10.8 g/dL — ABNORMAL LOW (ref 12.0–15.0)
MCHC: 33.8 g/dL (ref 30.0–36.0)
MCV: 94.6 fl (ref 78.0–100.0)
Platelets: 163 10*3/uL (ref 150.0–400.0)
RBC: 3.38 Mil/uL — ABNORMAL LOW (ref 3.87–5.11)
RDW: 14.1 % (ref 11.5–15.5)
WBC: 6.3 10*3/uL (ref 4.0–10.5)

## 2020-05-07 LAB — HM DIABETES FOOT EXAM

## 2020-05-07 NOTE — Assessment & Plan Note (Signed)
Last A1c 8% On semaglutide, glipizide and metformin Much better  Mild neuropathy--takes gabapentin at night (100mg )

## 2020-05-07 NOTE — Progress Notes (Signed)
Subjective:    Patient ID: [name], female    DOB: [birth year], 80 y.o.   MRN: [id number]  HPI Here for Medicare wellness visit and follow up of chronic health conditions This visit occurred during the SARS-CoV-2 public health emergency.  Safety protocols were in place, including screening questions prior to the visit, additional usage of staff PPE, and extensive cleaning of exam room while observing appropriate contact time as indicated for disinfecting solutions.   Reviewed form and advanced directives Reviewed other doctors No alcohol or tobacco Still doesn't exercise--discussed Vision okay--has appt next month Hearing is fine No falls No depression Independent with instrumental ADLs---son does the vacuuming Mild memory issues--nothing worrisome  Diabetes control is much better A1c down to 8% On semaglutide --but not losing weight on it Low dose gabapentin is some help---just at bedtime  Ongoing knee pain No meds Still is limiting for her Past glucosamine/chondroitin---does help some  Last GFR 40 Had to stop celebrex Is on losartan  No chest pain No SOB No dizziness or syncope Mild right ankle swelling--usually okay if not on it a lot  No regular mood issues Continues on the venlafaxine--basically for fibromyalgia  Current Outpatient Medications on File Prior to Visit  Medication Sig Dispense Refill  . aspirin 81 MG tablet Take 81 mg by mouth daily.    . Cyanocobalamin (VITAMIN B12 PO) Take by mouth.    . doxazosin (CARDURA) 4 MG tablet TAKE 1/2 TABLET TWICE DAILY 90 tablet 3  . FeFum-FePoly-FA-B Cmp-C-Biot (INTEGRA PLUS) CAPS Take 1 capsule by mouth every morning. 30 capsule 2  . gabapentin (NEURONTIN) 100 MG capsule TAKE 1 CAPSULE TWICE DAILY 180 capsule 3  . glipiZIDE (GLUCOTROL XL) 10 MG 24 hr tablet TAKE 1 TABLET TWICE DAILY 180 tablet 3  . Glucosamine-Chondroitin (COSAMIN DS PO) Take by mouth.    . losartan-hydrochlorothiazide (HYZAAR) 100-25 MG  tablet TAKE 1 TABLET EVERY DAY 90 tablet 3  . metFORMIN (GLUCOPHAGE-XR) 750 MG 24 hr tablet TAKE 2 TABLETS EVERY DAY WITH BREAKFAST 180 tablet 3  . Multiple Vitamin (MULTIVITAMIN) tablet Take 1 tablet by mouth daily.    Marland Kitchen OVER THE COUNTER MEDICATION Knock Out All Natural Sleep Aid    . Semaglutide (RYBELSUS) 7 MG TABS Take 1 tablet by mouth daily.    Marland Kitchen tiZANidine (ZANAFLEX) 4 MG tablet TAKE 1 TABLET AT BEDTIME 90 tablet 1  . venlafaxine (EFFEXOR) 37.5 MG tablet TAKE 1 TABLET TWICE DAILY 180 tablet 3   No current facility-administered medications on file prior to visit.    Allergies  Allergen Reactions  . Diphenhydramine Hcl Other (See Comments)    hallucinations   . Fluoxetine Hcl     REACTION: raised blood pressure  . Lisinopril     REACTION: dizzy  . Sertraline Hcl     REACTION: dizziness    Past Medical History:  Diagnosis Date  . Allergic rhinitis   . Anal fissure   . Arthritis   . Asthma   . Depression   . Diabetes mellitus    type 2  . Diverticulosis   . Fibromyalgia   . GERD (gastroesophageal reflux disease)   . Hypertension   . Obesity   . Osteoarthritis     Past Surgical History:  Procedure Laterality Date  . ABDOMINAL HYSTERECTOMY  1970's  . CATARACT EXTRACTION, BILATERAL    . CHOLECYSTECTOMY  4/06  . TUBAL LIGATION  1970's  . VENTRAL HERNIA REPAIR  9/08   [facility]  Family History  Problem Relation Age of Onset  . Heart attack Father   . Coronary artery disease Father   . Kidney cancer Mother   . Lung cancer Brother   . Liver cancer Brother   . Diabetes Other        Maternal side  . Brain cancer Daughter   . Hypertension Neg Hx   . Breast cancer Neg Hx   . Colon cancer Neg Hx     Social History   Socioeconomic History  . Marital status: Divorced    Spouse name: Not on file  . Number of children: 3  . Years of education: Not on file  . Highest education level: Not on file  Occupational History  . Occupation: Scientist, research (physical sciences):  RETIRED  Tobacco Use  . Smoking status: Never Smoker  . Smokeless tobacco: Never Used  Vaping Use  . Vaping Use: Never used  Substance and Sexual Activity  . Alcohol use: No    Alcohol/week: 0.0 standard drinks  . Drug use: No  . Sexual activity: Not on file  Other Topics Concern  . Not on file  Social History Narrative   No living will   No health care POA but would want son, [name], to make decisions about her care if needed. Other child are alternatives   Would accept resuscitation attempts   Probably wouldn't want tube feeds if cognitively unaware   Social Determinants of Health   Financial Resource Strain: Not on file  Food Insecurity: Not on file  Transportation Needs: Not on file  Physical Activity: Not on file  Stress: Not on file  Social Connections: Not on file  Intimate Partner Violence: Not on file   Review of Systems Appetite is fine Some sleep problems---4 hours, then wakes up. Usually able to get back to sleep (chronic) Wears seat belt Only a few teeth---doesn't see the dentist No suspicious skin lesions---has 1 spot on trunk to be checked (crusty) No heartburn or dysphagia Bowels are fine--no blood No dysuria or hematuria. Some AM urgency---but no incontinence No other joint pains--just knees mainly     Objective:   Physical Exam Constitutional:      Appearance: Normal appearance.  HENT:     Mouth/Throat:     Comments: No lesions Eyes:     Conjunctiva/sclera: Conjunctivae normal.     Pupils: Pupils are equal, round, and reactive to light.  Cardiovascular:     Rate and Rhythm: Normal rate and regular rhythm.     Pulses: Normal pulses.     Heart sounds: No murmur heard. No gallop.   Pulmonary:     Effort: Pulmonary effort is normal.     Breath sounds: Normal breath sounds. No wheezing or rales.  Abdominal:     Palpations: Abdomen is soft.     Tenderness: There is no abdominal tenderness.  Musculoskeletal:     Cervical back: Neck supple.      Right lower leg: No edema.     Comments: Slight left lateral ankle swelling  Lymphadenopathy:     Cervical: No cervical adenopathy.  Skin:    Findings: No rash.     Comments: Irritated seb keratosis on upper left flank  Neurological:     Mental Status: She is alert and oriented to person, place, and time.     Comments: President--- "Biden, Trump, Clinton----Obama" 100- "I don't do math" D-l-r-o-w Recall 2/3  Mild decreased sensation in feet   Psychiatric:  Mood and Affect: Mood normal.        Behavior: Behavior normal.            Assessment & Plan:

## 2020-05-07 NOTE — Assessment & Plan Note (Signed)
BP Readings from Last 3 Encounters:  05/07/20 128/80  11/07/19 130/80  11/03/19 (!) 142/80   Controlled with doxazosin and losartan/HCTZ

## 2020-05-07 NOTE — Assessment & Plan Note (Signed)
Mood has been fine On the venlafaxine for fibromyalgia

## 2020-05-07 NOTE — Progress Notes (Signed)
Hearing Screening   Method: Audiometry   125Hz  250Hz  500Hz  1000Hz  2000Hz  3000Hz  4000Hz  6000Hz  8000Hz   Right ear:   20 25 25   0    Left ear:   20 20 20   0    Vision Screening Comments: Making appt for April 2022

## 2020-05-07 NOTE — Assessment & Plan Note (Signed)
I have personally reviewed the Medicare Annual Wellness questionnaire and have noted 1. The patient's medical and social history 2. Their use of alcohol, tobacco or illicit drugs 3. Their current medications and supplements 4. The patient's functional ability including ADL's, fall risks, home safety risks and hearing or visual             impairment. 5. Diet and physical activities 6. Evidence for depression or mood disorders  The patients weight, height, BMI and visual acuity have been recorded in the chart I have made referrals, counseling and provided education to the patient based review of the above and I have provided the pt with a written personalized care plan for preventive services.  I have provided you with a copy of your personalized plan for preventive services. Please take the time to review along with your updated medication list.  Done with cancer screening Needs the second shingrix at pharmacy Didn't get COVID booster---will get when it surges again Flu vaccine in the fall Discussed at least doing chair exercises

## 2020-05-07 NOTE — Assessment & Plan Note (Signed)
See social history 

## 2020-05-07 NOTE — Assessment & Plan Note (Signed)
Will recheck levels Is on losartan

## 2020-05-08 LAB — PARATHYROID HORMONE, INTACT (NO CA): PTH: 64 pg/mL (ref 16–77)

## 2020-05-23 ENCOUNTER — Other Ambulatory Visit: Payer: Self-pay | Admitting: Internal Medicine

## 2020-06-04 ENCOUNTER — Other Ambulatory Visit: Payer: Self-pay | Admitting: Internal Medicine

## 2020-06-04 NOTE — Telephone Encounter (Signed)
Last filled 01-18-20 #90/0 Last OV 05-07-20 Next OV 05-12-21 [facility] Mail Order

## 2020-08-15 DIAGNOSIS — E1142 Type 2 diabetes mellitus with diabetic polyneuropathy: Secondary | ICD-10-CM | POA: Diagnosis not present

## 2020-08-15 DIAGNOSIS — E1159 Type 2 diabetes mellitus with other circulatory complications: Secondary | ICD-10-CM | POA: Diagnosis not present

## 2020-08-15 DIAGNOSIS — I152 Hypertension secondary to endocrine disorders: Secondary | ICD-10-CM | POA: Diagnosis not present

## 2020-08-15 DIAGNOSIS — E785 Hyperlipidemia, unspecified: Secondary | ICD-10-CM | POA: Diagnosis not present

## 2020-08-15 DIAGNOSIS — E1169 Type 2 diabetes mellitus with other specified complication: Secondary | ICD-10-CM | POA: Diagnosis not present

## 2020-08-29 ENCOUNTER — Other Ambulatory Visit: Payer: Self-pay

## 2020-08-29 ENCOUNTER — Encounter: Payer: Self-pay | Admitting: Intensive Care

## 2020-08-29 ENCOUNTER — Emergency Department: Payer: Medicare PPO

## 2020-08-29 ENCOUNTER — Telehealth: Payer: Self-pay

## 2020-08-29 ENCOUNTER — Emergency Department
Admission: EM | Admit: 2020-08-29 | Discharge: 2020-08-29 | Disposition: A | Discharge status: Home / Self Care | Payer: Medicare PPO | Attending: Emergency Medicine | Admitting: Emergency Medicine

## 2020-08-29 DIAGNOSIS — R197 Diarrhea, unspecified: Secondary | ICD-10-CM | POA: Diagnosis present

## 2020-08-29 DIAGNOSIS — J189 Pneumonia, unspecified organism: Secondary | ICD-10-CM | POA: Diagnosis not present

## 2020-08-29 DIAGNOSIS — U071 COVID-19: Secondary | ICD-10-CM | POA: Diagnosis not present

## 2020-08-29 DIAGNOSIS — N1832 Chronic kidney disease, stage 3b: Secondary | ICD-10-CM | POA: Diagnosis not present

## 2020-08-29 DIAGNOSIS — E1140 Type 2 diabetes mellitus with diabetic neuropathy, unspecified: Secondary | ICD-10-CM | POA: Diagnosis not present

## 2020-08-29 DIAGNOSIS — I129 Hypertensive chronic kidney disease with stage 1 through stage 4 chronic kidney disease, or unspecified chronic kidney disease: Secondary | ICD-10-CM | POA: Insufficient documentation

## 2020-08-29 DIAGNOSIS — Z79899 Other long term (current) drug therapy: Secondary | ICD-10-CM | POA: Insufficient documentation

## 2020-08-29 DIAGNOSIS — R059 Cough, unspecified: Secondary | ICD-10-CM | POA: Diagnosis not present

## 2020-08-29 DIAGNOSIS — E1122 Type 2 diabetes mellitus with diabetic chronic kidney disease: Secondary | ICD-10-CM | POA: Insufficient documentation

## 2020-08-29 DIAGNOSIS — Z7982 Long term (current) use of aspirin: Secondary | ICD-10-CM | POA: Insufficient documentation

## 2020-08-29 DIAGNOSIS — Z7984 Long term (current) use of oral hypoglycemic drugs: Secondary | ICD-10-CM | POA: Insufficient documentation

## 2020-08-29 DIAGNOSIS — J45909 Unspecified asthma, uncomplicated: Secondary | ICD-10-CM | POA: Diagnosis not present

## 2020-08-29 LAB — CBC
HCT: 29.9 % — ABNORMAL LOW (ref 36.0–46.0)
Hemoglobin: 10.2 g/dL — ABNORMAL LOW (ref 12.0–15.0)
MCH: 32.8 pg (ref 26.0–34.0)
MCHC: 34.1 g/dL (ref 30.0–36.0)
MCV: 96.1 fL (ref 80.0–100.0)
Platelets: 131 10*3/uL — ABNORMAL LOW (ref 150–400)
RBC: 3.11 MIL/uL — ABNORMAL LOW (ref 3.87–5.11)
RDW: 13.6 % (ref 11.5–15.5)
WBC: 3.7 10*3/uL — ABNORMAL LOW (ref 4.0–10.5)
nRBC: 0 % (ref 0.0–0.2)

## 2020-08-29 LAB — BASIC METABOLIC PANEL
Anion gap: 10 (ref 5–15)
BUN: 21 mg/dL (ref 8–23)
CO2: 25 mmol/L (ref 22–32)
Calcium: 8.3 mg/dL — ABNORMAL LOW (ref 8.9–10.3)
Chloride: 98 mmol/L (ref 98–111)
Creatinine, Ser: 1.32 mg/dL — ABNORMAL HIGH (ref 0.44–1.00)
GFR, Estimated: 41 mL/min — ABNORMAL LOW (ref >60)
Glucose, Bld: 108 mg/dL — ABNORMAL HIGH (ref 70–99)
Potassium: 3.9 mmol/L (ref 3.5–5.1)
Sodium: 133 mmol/L — ABNORMAL LOW (ref 135–145)

## 2020-08-29 MED ORDER — NIRMATRELVIR/RITONAVIR (PAXLOVID) TABLET (RENAL DOSING): 2.0000 | ORAL_TABLET | Freq: Two times a day (BID) | ORAL | 0 refills | Status: DC

## 2020-08-29 MED ORDER — SODIUM CHLORIDE 0.9 % IV BOLUS
1000.0000 mL | Freq: Once | INTRAVENOUS | Status: AC
Start: 2020-08-29 — End: 2020-08-29
  Administered 2020-08-29: 1000 mL via INTRAVENOUS

## 2020-08-29 NOTE — Telephone Encounter (Signed)
Dr Silvio Pate has already done his box for today. I will need to send this to a provider here in the office.

## 2020-08-29 NOTE — ED Provider Notes (Signed)
[facility]-EMERGENCY DEPARTMENT  ____________________________________________  Time seen: Approximately 5:40 PM  I have reviewed the triage vital signs and the nursing notes.   HISTORY  Chief Complaint Cough, Nasal Congestion, and Weakness   Historian Patient     HPI [name] is a 80 y.o. female presents to the emergency department with diarrhea, nausea, chills, diminished appetite, cough, nasal congestion and malaise.  Patient had a positive at-home COVID test on 08/28/2020.  Her symptoms started on 717.  No chest pain, chest tightness or shortness of breath.  She denies recent travel.  No other alleviating measures have been attempted.   Past Medical History:  Diagnosis Date   Allergic rhinitis    Anal fissure    Arthritis    Asthma    Depression    Diabetes mellitus    type 2   Diverticulosis    Fibromyalgia    GERD (gastroesophageal reflux disease)    Hypertension    Obesity    Osteoarthritis      Immunizations up to date:  Yes.     Past Medical History:  Diagnosis Date   Allergic rhinitis    Anal fissure    Arthritis    Asthma    Depression    Diabetes mellitus    type 2   Diverticulosis    Fibromyalgia    GERD (gastroesophageal reflux disease)    Hypertension    Obesity    Osteoarthritis     Patient Active Problem List   Diagnosis Date Noted   Stage 3b chronic kidney disease ([place]) 12/25/2016   Right hand pain 07/12/2015   Advance directive discussed with patient 07/12/2015   Iron deficiency anemia 07/18/2014   Type 2 diabetes, uncontrolled, with neuropathy ([place]) 02/22/2012   Routine general medical examination at a health care facility 08/25/2011   Osteoarthritis, knee 08/25/2011   Fibromyalgia 02/25/2011   Obesity 02/25/2011   UTERINE PROLAPSE 02/25/2010   GERD 02/13/2009   CARCINOMA, BASAL CELL 05/18/2008   ALLERGIC RHINITIS 05/18/2008   Episodic mood disorder ([place]) 06/20/2007   Essential hypertension, benign 10/27/2006     Past Surgical History:  Procedure Laterality Date   ABDOMINAL HYSTERECTOMY  1970's   CATARACT EXTRACTION, BILATERAL     CHOLECYSTECTOMY  4/06   TUBAL LIGATION  1970's   VENTRAL HERNIA REPAIR  9/08   [place]    Prior to Admission medications   Medication Sig Start Date End Date Taking? Authorizing Provider  aspirin 81 MG tablet Take 81 mg by mouth daily.    [provider]  Cyanocobalamin (VITAMIN B12 PO) Take by mouth.    [provider]  doxazosin (CARDURA) 4 MG tablet TAKE 1/2 TABLET TWICE DAILY 12/12/19   [name] I, MD  FeFum-FePoly-FA-B Cmp-C-Biot (INTEGRA PLUS) CAPS Take 1 capsule by mouth every morning. 09/19/14   Curt Bears, MD  gabapentin (NEURONTIN) 100 MG capsule TAKE 1 CAPSULE TWICE DAILY 12/12/19   [name] I, MD  glipiZIDE (GLUCOTROL XL) 10 MG 24 hr tablet TAKE 1 TABLET TWICE DAILY 01/18/20   Venia Carbon, MD  Glucosamine-Chondroitin (COSAMIN DS PO) Take by mouth.    [provider]  losartan-hydrochlorothiazide (HYZAAR) 100-25 MG tablet TAKE 1 TABLET EVERY DAY 05/23/20   [name] I, MD  metFORMIN (GLUCOPHAGE-XR) 750 MG 24 hr tablet TAKE 2 TABLETS EVERY DAY WITH BREAKFAST 01/18/20   Venia Carbon, MD  Multiple Vitamin (MULTIVITAMIN) tablet Take 1 tablet by mouth daily.    [provider]  nirmatrelvir/ritonavir EUA, renal dosing, (PAXLOVID) TABS Take 2 tablets by mouth 2 (two) times daily for 5 days. Patient GFR is 45. Take nirmatrelvir (150 mg) one tablet twice daily for 5 days and ritonavir (100 mg) one tablet twice daily for 5 days. 08/29/20 09/03/20  Lannie Fields, PA-C  OVER THE COUNTER MEDICATION Knock Out All Natural Sleep Aid    [provider]  Semaglutide (RYBELSUS) 7 MG TABS Take 1 tablet by mouth daily. 11/22/19   [provider]  tiZANidine (ZANAFLEX) 4 MG tablet TAKE 1 TABLET AT BEDTIME 06/05/20   Venia Carbon, MD  venlafaxine (EFFEXOR) 37.5 MG tablet TAKE 1 TABLET  TWICE DAILY 12/12/19   Venia Carbon, MD    Allergies Diphenhydramine hcl, Fluoxetine hcl, Lisinopril, and Sertraline hcl  Family History  Problem Relation Age of Onset   Heart attack Father    Coronary artery disease Father    Kidney cancer Mother    Lung cancer Brother    Liver cancer Brother    Diabetes Other        Maternal side   Brain cancer Daughter    Hypertension Neg Hx    Breast cancer Neg Hx    Colon cancer Neg Hx     Social History Social History   Tobacco Use   Smoking status: Never   Smokeless tobacco: Never  Vaping Use   Vaping Use: Never used  Substance Use Topics   Alcohol use: No    Alcohol/week: 0.0 standard drinks   Drug use: No      Review of Systems  Constitutional: Patient has fever.  Eyes: No visual changes. No discharge ENT: Patient has congestion.  Cardiovascular: no chest pain. Respiratory: Patient has cough.  Gastrointestinal: No abdominal pain.  No nausea, no vomiting. Patient had diarrhea.  Genitourinary: Negative for dysuria. No hematuria Musculoskeletal: Patient has myalgias.  Skin: Negative for rash, abrasions, lacerations, ecchymosis. Neurological: Patient has headache, no focal weakness or numbness.     ____________________________________________   PHYSICAL EXAM:  VITAL SIGNS: ED Triage Vitals  Enc Vitals Group     BP 08/29/20 1658 (!) 154/78     Pulse Rate 08/29/20 1658 84     Resp 08/29/20 1658 20     Temp 08/29/20 1624 100.1 F (37.8 C)     Temp Source 08/29/20 1624 Oral     SpO2 08/29/20 1658 97 %     Weight 08/29/20 1625 195 lb (88.5 kg)     Height 08/29/20 1625 5\' 4"  (1.626 m)     Head Circumference --      Peak Flow --      Pain Score 08/29/20 1624 8     Pain Loc --      Pain Edu? --      Excl. in [place]? --      Constitutional: Alert and oriented. Patient is lying supine. Eyes: Conjunctivae are normal. PERRL. EOMI. Head: Atraumatic. ENT:      Ears: Tympanic membranes are mildly injected with  mild effusion bilaterally.       Nose: No congestion/rhinnorhea.      Mouth/Throat: Mucous membranes are moist. Posterior pharynx is mildly erythematous.  Hematological/Lymphatic/Immunilogical: No cervical lymphadenopathy.  Cardiovascular: Normal rate, regular rhythm. Normal S1 and S2.  Good peripheral circulation. Respiratory: Normal respiratory effort without tachypnea or retractions. Lungs CTAB. Good air entry to the bases with no decreased or absent breath sounds. Gastrointestinal: Bowel sounds 4 quadrants. Soft and nontender to palpation. No guarding  or rigidity. No palpable masses. No distention. No CVA tenderness. Musculoskeletal: Full range of motion to all extremities. No gross deformities appreciated. Neurologic:  Normal speech and language. No gross focal neurologic deficits are appreciated.  Skin:  Skin is warm, dry and intact. No rash noted. Psychiatric: Mood and affect are normal. Speech and behavior are normal. Patient exhibits appropriate insight and judgement.   ____________________________________________   LABS (all labs ordered are listed, but only abnormal results are displayed)  Labs Reviewed  BASIC METABOLIC PANEL - Abnormal; Notable for the following components:      Result Value   Sodium 133 (*)    Glucose, Bld 108 (*)    Creatinine, Ser 1.32 (*)    Calcium 8.3 (*)    GFR, Estimated 41 (*)    All other components within normal limits  CBC - Abnormal; Notable for the following components:   WBC 3.7 (*)    RBC 3.11 (*)    Hemoglobin 10.2 (*)    HCT 29.9 (*)    Platelets 131 (*)    All other components within normal limits  URINALYSIS, COMPLETE (UACMP) WITH MICROSCOPIC   ____________________________________________  EKG   ____________________________________________  RADIOLOGY   DG Chest 1 View  Result Date: 08/29/2020 CLINICAL DATA:  Cough with COVID. EXAM: CHEST  1 VIEW COMPARISON:  10/25/2006. FINDINGS: Low lung volumes. Mild interstitial and  airspace opacities in the left mid to lower lung. No visible pleural effusions or pneumothorax. Cardiomediastinal silhouette is within normal limits. Bilateral shoulder degenerative change. Dextrocurvature of the thoracic spine. IMPRESSION: Mild interstitial and airspace opacities in the left mid to lower lung, suspicious for pneumonia in this patient with COVID. Electronically Signed   By: Margaretha Sheffield MD   On: 08/29/2020 17:41    ____________________________________________    PROCEDURES  Procedure(s) performed:     Procedures     Medications  sodium chloride 0.9 % bolus 1,000 mL (0 mLs Intravenous Stopped 08/29/20 1918)     ____________________________________________   INITIAL IMPRESSION / ASSESSMENT AND PLAN / ED COURSE  Pertinent labs & imaging results that were available during my care of the patient were reviewed by me and considered in my medical decision making (see chart for details).  Clinical Course as of 08/29/20 2248  Thu Aug 29, 2020  1810 DG Chest 1 View [JW]    Clinical Course User Index [JW] Vallarie Mare M, [place]     Assessment and Plan:  COVID-22 80 year old female presents to the emergency department with fever, nausea, vomiting, diarrhea and generalized malaise since 7/17. Patient had low-grade fever at triage but vital signs were otherwise reassuring.  Patient was alert, active and nontoxic-appearing.  CBC showed mild leukopenia.  BMP showed mild hyponatremia, 133.  Patient's creatinine is baseline at 1.32.  Chest x-ray findings were concerning for COVID-pneumonia.  Patient was given supplemental fluids in the emergency department.  Ambulatory O2 sat showed 98%.  Patient reported that she felt significantly improved after fluids.  Patient was started on Paxlovid.  Rest and hydration were encouraged at home.  Recommended return to the emergency department for reevaluation if patient's symptoms worsen through time.  Patient has easy access to the  emergency department should her symptoms change.    ____________________________________________  FINAL CLINICAL IMPRESSION(S) / ED DIAGNOSES  Final diagnoses:  COVID-19      NEW MEDICATIONS STARTED DURING THIS VISIT:  ED Discharge Orders          Ordered    nirmatrelvir/ritonavir  EUA, renal dosing, (PAXLOVID) TABS  2 times daily,   Status:  Discontinued        08/29/20 1922    nirmatrelvir/ritonavir EUA, renal dosing, (PAXLOVID) TABS  2 times daily        08/29/20 1925                This chart was dictated using voice recognition software/Dragon. Despite best efforts to proofread, errors can occur which can change the meaning. Any change was purely unintentional.     Karren Cobble 08/29/20 2248    Blake Divine, MD 08/29/20 [phone]

## 2020-08-29 NOTE — ED Notes (Signed)
Patient ambulated to and from restroom in hallway with continuous O2 monitoring in place, oxygen 97-99% while ambulating. Patient denies shortness of breath.

## 2020-08-29 NOTE — Discharge Instructions (Addendum)
Please take Paxlovid as directed.

## 2020-08-29 NOTE — Telephone Encounter (Signed)
Agree with ER eval given has not kept anything down for 3 days and needing ASAP COVID treatment. Likely dehydrated and in need of IVF.

## 2020-08-29 NOTE — ED Triage Notes (Signed)
Patient presents with abdominal pain, chills, loss of appetite, cough, congestion, ears stopped up, and diarrhea. Positive covid at home test 08/28/20. Started feeling bad 08/25/20

## 2020-08-29 NOTE — Telephone Encounter (Signed)
Starting on 08/25/20 pt started with symptoms of covid, initially had fever but does not think fever now; pt can't lay flat, congestion and cough (was not sure if prod or dry) pt also started N&V&diarrhea (that was loose black stool but pt takes iron) on 08/26/20. Pt said for last 3 days cannot keep anything down. Pt cannot eat or drink;pt has dry mouth and dizziness. No abd pain. Pt is on Rybelsus,metformin and glipizide and pt cannot take her meds because the pills come out as whole pills with diarrhea. Pt also did home covid test on 08/25/20 which was +. Pt said the son that lives with her dx with covid last wk(cannot remember the day). Due to symptoms and end of window of time to start antiviral if provider thought necessary and possibility of being dehydrated pt said when her son got up from taking a nap she will go to [facility] ED for eval. Sending note to DR Everardo Beals inbox.

## 2020-08-30 ENCOUNTER — Other Ambulatory Visit: Payer: Self-pay

## 2020-08-30 DIAGNOSIS — N1832 Chronic kidney disease, stage 3b: Secondary | ICD-10-CM | POA: Diagnosis not present

## 2020-08-30 DIAGNOSIS — E1122 Type 2 diabetes mellitus with diabetic chronic kidney disease: Secondary | ICD-10-CM | POA: Diagnosis not present

## 2020-08-30 DIAGNOSIS — E1140 Type 2 diabetes mellitus with diabetic neuropathy, unspecified: Secondary | ICD-10-CM | POA: Diagnosis not present

## 2020-08-30 DIAGNOSIS — Z7982 Long term (current) use of aspirin: Secondary | ICD-10-CM | POA: Insufficient documentation

## 2020-08-30 DIAGNOSIS — R404 Transient alteration of awareness: Secondary | ICD-10-CM | POA: Diagnosis not present

## 2020-08-30 DIAGNOSIS — R112 Nausea with vomiting, unspecified: Secondary | ICD-10-CM | POA: Diagnosis not present

## 2020-08-30 DIAGNOSIS — R531 Weakness: Secondary | ICD-10-CM | POA: Diagnosis present

## 2020-08-30 DIAGNOSIS — E860 Dehydration: Secondary | ICD-10-CM | POA: Diagnosis not present

## 2020-08-30 DIAGNOSIS — Z85828 Personal history of other malignant neoplasm of skin: Secondary | ICD-10-CM | POA: Diagnosis not present

## 2020-08-30 DIAGNOSIS — R519 Headache, unspecified: Secondary | ICD-10-CM | POA: Diagnosis not present

## 2020-08-30 DIAGNOSIS — Z7984 Long term (current) use of oral hypoglycemic drugs: Secondary | ICD-10-CM | POA: Insufficient documentation

## 2020-08-30 DIAGNOSIS — Z79899 Other long term (current) drug therapy: Secondary | ICD-10-CM | POA: Insufficient documentation

## 2020-08-30 DIAGNOSIS — R0902 Hypoxemia: Secondary | ICD-10-CM | POA: Diagnosis not present

## 2020-08-30 DIAGNOSIS — J45909 Unspecified asthma, uncomplicated: Secondary | ICD-10-CM | POA: Insufficient documentation

## 2020-08-30 DIAGNOSIS — I129 Hypertensive chronic kidney disease with stage 1 through stage 4 chronic kidney disease, or unspecified chronic kidney disease: Secondary | ICD-10-CM | POA: Diagnosis not present

## 2020-08-30 DIAGNOSIS — U071 COVID-19: Secondary | ICD-10-CM | POA: Insufficient documentation

## 2020-08-30 DIAGNOSIS — R4182 Altered mental status, unspecified: Secondary | ICD-10-CM | POA: Diagnosis not present

## 2020-08-30 DIAGNOSIS — I10 Essential (primary) hypertension: Secondary | ICD-10-CM | POA: Diagnosis not present

## 2020-08-30 LAB — BASIC METABOLIC PANEL
Anion gap: 10 (ref 5–15)
BUN: 17 mg/dL (ref 8–23)
CO2: 22 mmol/L (ref 22–32)
Calcium: 8.1 mg/dL — ABNORMAL LOW (ref 8.9–10.3)
Chloride: 102 mmol/L (ref 98–111)
Creatinine, Ser: 1.2 mg/dL — ABNORMAL HIGH (ref 0.44–1.00)
GFR, Estimated: 46 mL/min — ABNORMAL LOW (ref >60)
Glucose, Bld: 66 mg/dL — ABNORMAL LOW (ref 70–99)
Potassium: 3.5 mmol/L (ref 3.5–5.1)
Sodium: 134 mmol/L — ABNORMAL LOW (ref 135–145)

## 2020-08-30 LAB — CBC
HCT: 31.6 % — ABNORMAL LOW (ref 36.0–46.0)
Hemoglobin: 10.5 g/dL — ABNORMAL LOW (ref 12.0–15.0)
MCH: 31.6 pg (ref 26.0–34.0)
MCHC: 33.2 g/dL (ref 30.0–36.0)
MCV: 95.2 fL (ref 80.0–100.0)
Platelets: 124 10*3/uL — ABNORMAL LOW (ref 150–400)
RBC: 3.32 MIL/uL — ABNORMAL LOW (ref 3.87–5.11)
RDW: 13.8 % (ref 11.5–15.5)
WBC: 3.8 10*3/uL — ABNORMAL LOW (ref 4.0–10.5)
nRBC: 0 % (ref 0.0–0.2)

## 2020-08-30 LAB — CBG MONITORING, ED: Glucose-Capillary: 90 mg/dL (ref 70–99)

## 2020-08-30 NOTE — ED Triage Notes (Addendum)
Pt states she tested positive for covid yesterday and was prescribed paxlovid, pt states she took it today and feels like she had a stroke. Pt able to move all extremities, a&o x4, speaking in clear complete sentences. When pt asked why she felt this way pt responds she just doesn't feel good. Pt able to tell me she recalls having an ekg done yesterday and that they had a hard time getting blood on her yesterday while she was here

## 2020-08-30 NOTE — Telephone Encounter (Signed)
Left message to call office to see how she is doing.

## 2020-08-31 ENCOUNTER — Emergency Department: Payer: Medicare PPO

## 2020-08-31 ENCOUNTER — Emergency Department
Admission: EM | Admit: 2020-08-31 | Discharge: 2020-08-31 | Disposition: A | Discharge status: Home / Self Care | Payer: Medicare PPO | Attending: Emergency Medicine | Admitting: Emergency Medicine

## 2020-08-31 DIAGNOSIS — R112 Nausea with vomiting, unspecified: Secondary | ICD-10-CM

## 2020-08-31 DIAGNOSIS — E860 Dehydration: Secondary | ICD-10-CM | POA: Diagnosis not present

## 2020-08-31 DIAGNOSIS — U071 COVID-19: Secondary | ICD-10-CM

## 2020-08-31 DIAGNOSIS — I10 Essential (primary) hypertension: Secondary | ICD-10-CM | POA: Diagnosis not present

## 2020-08-31 DIAGNOSIS — R197 Diarrhea, unspecified: Secondary | ICD-10-CM

## 2020-08-31 DIAGNOSIS — R519 Headache, unspecified: Secondary | ICD-10-CM | POA: Diagnosis not present

## 2020-08-31 LAB — BASIC METABOLIC PANEL
Anion gap: 10 (ref 5–15)
BUN: 20 mg/dL (ref 8–23)
CO2: 22 mmol/L (ref 22–32)
Calcium: 7.6 mg/dL — ABNORMAL LOW (ref 8.9–10.3)
Chloride: 98 mmol/L (ref 98–111)
Creatinine, Ser: 1.28 mg/dL — ABNORMAL HIGH (ref 0.44–1.00)
GFR, Estimated: 43 mL/min — ABNORMAL LOW (ref >60)
Glucose, Bld: 70 mg/dL (ref 70–99)
Potassium: 3.6 mmol/L (ref 3.5–5.1)
Sodium: 130 mmol/L — ABNORMAL LOW (ref 135–145)

## 2020-08-31 LAB — TSH: TSH: 1.631 u[IU]/mL (ref 0.350–4.500)

## 2020-08-31 LAB — MAGNESIUM: Magnesium: 1.4 mg/dL — ABNORMAL LOW (ref 1.7–2.4)

## 2020-08-31 LAB — TROPONIN I (HIGH SENSITIVITY)
Troponin I (High Sensitivity): 23 ng/L — ABNORMAL HIGH (ref <18)
Troponin I (High Sensitivity): 23 ng/L — ABNORMAL HIGH (ref <18)

## 2020-08-31 LAB — CBG MONITORING, ED
Glucose-Capillary: 72 mg/dL (ref 70–99)
Glucose-Capillary: 80 mg/dL (ref 70–99)

## 2020-08-31 MED ORDER — MAGNESIUM SULFATE 4 GM/100ML IV SOLN
4.0000 g | Freq: Once | INTRAVENOUS | Status: AC
  Administered 2020-08-31: 4 g via INTRAVENOUS
  Filled 2020-08-31: qty 100

## 2020-08-31 MED ORDER — ACETAMINOPHEN 500 MG PO TABS
1000.0000 mg | ORAL_TABLET | Freq: Once | ORAL | Status: AC
  Administered 2020-08-31: 1000 mg via ORAL
  Filled 2020-08-31: qty 2

## 2020-08-31 MED ORDER — ACETAMINOPHEN 325 MG PO TABS
650.0000 mg | ORAL_TABLET | Freq: Once | ORAL | Status: AC
  Administered 2020-08-31: 650 mg via ORAL
  Filled 2020-08-31: qty 2

## 2020-08-31 MED ORDER — ONDANSETRON 4 MG PO TBDP: 4.0000 mg | ORAL_TABLET | Freq: Three times a day (TID) | ORAL | 0 refills | Status: DC | PRN

## 2020-08-31 MED ORDER — ONDANSETRON 4 MG PO TBDP
4.0000 mg | ORAL_TABLET | Freq: Once | ORAL | Status: AC
  Administered 2020-08-31: 4 mg via ORAL
  Filled 2020-08-31: qty 1

## 2020-08-31 NOTE — ED Provider Notes (Signed)
[facility] Emergency Department Provider Note  ____________________________________________   Event Date/Time   First MD Initiated Contact with Patient 08/31/20 [phone]     (approximate)  I have reviewed the triage vital signs and the nursing notes.   HISTORY  Chief Complaint Weakness   HPI [name] is a 80 y.o. female with a past medical history of DM, diverticulosis, fibromyalgia, HTN, obesity, arthritis, and recent diagnosis of COVID on 7/20 on a home COVID test who presents for assessment of some ongoing weakness nonbloody emesis vomiting diarrhea and feeling like she could not move or speak after taking Paxil but prescribed she was seen on 7/21.  Nurses mild cough but no significant shortness of breath, chest pain, earache, sore throat but does endorse headache.  No rash, focal extremity weakness numbness or tingling or any other acute complaints.  She endorses very little appetite because it was a secondary to nausea.  She has not been taking any antiemetics.  No other acute concerns at this time.          Past Medical History:  Diagnosis Date   Allergic rhinitis    Anal fissure    Arthritis    Asthma    Depression    Diabetes mellitus    type 2   Diverticulosis    Fibromyalgia    GERD (gastroesophageal reflux disease)    Hypertension    Obesity    Osteoarthritis     Patient Active Problem List   Diagnosis Date Noted   Stage 3b chronic kidney disease ([place]) 12/25/2016   Right hand pain 07/12/2015   Advance directive discussed with patient 07/12/2015   Iron deficiency anemia 07/18/2014   Type 2 diabetes, uncontrolled, with neuropathy ([place]) 02/22/2012   Routine general medical examination at a health care facility 08/25/2011   Osteoarthritis, knee 08/25/2011   Fibromyalgia 02/25/2011   Obesity 02/25/2011   UTERINE PROLAPSE 02/25/2010   GERD 02/13/2009   CARCINOMA, BASAL CELL 05/18/2008   ALLERGIC RHINITIS 05/18/2008   Episodic  mood disorder ([place]) 06/20/2007   Essential hypertension, benign 10/27/2006    Past Surgical History:  Procedure Laterality Date   ABDOMINAL HYSTERECTOMY  1970's   CATARACT EXTRACTION, BILATERAL     CHOLECYSTECTOMY  4/06   TUBAL LIGATION  1970's   VENTRAL HERNIA REPAIR  9/08   [place]    Prior to Admission medications   Medication Sig Start Date End Date Taking? Authorizing Provider  aspirin 81 MG tablet Take 81 mg by mouth daily.    [provider]  Cyanocobalamin (VITAMIN B12 PO) Take by mouth.    [provider]  doxazosin (CARDURA) 4 MG tablet TAKE 1/2 TABLET TWICE DAILY 12/12/19   [name] I, MD  FeFum-FePoly-FA-B Cmp-C-Biot (INTEGRA PLUS) CAPS Take 1 capsule by mouth every morning. 09/19/14   Curt Bears, MD  gabapentin (NEURONTIN) 100 MG capsule TAKE 1 CAPSULE TWICE DAILY 12/12/19   [name] I, MD  glipiZIDE (GLUCOTROL XL) 10 MG 24 hr tablet TAKE 1 TABLET TWICE DAILY 01/18/20   Venia Carbon, MD  Glucosamine-Chondroitin (COSAMIN DS PO) Take by mouth.    [provider]  losartan-hydrochlorothiazide (HYZAAR) 100-25 MG tablet TAKE 1 TABLET EVERY DAY 05/23/20   [name] I, MD  metFORMIN (GLUCOPHAGE-XR) 750 MG 24 hr tablet TAKE 2 TABLETS EVERY DAY WITH BREAKFAST 01/18/20   Venia Carbon, MD  Multiple Vitamin (MULTIVITAMIN) tablet Take 1 tablet by mouth daily.    [provider]  nirmatrelvir/ritonavir EUA, renal dosing, (PAXLOVID) TABS Take 2 tablets by mouth 2 (two) times daily for 5 days. Patient GFR is 45. Take nirmatrelvir (150 mg) one tablet twice daily for 5 days and ritonavir (100 mg) one tablet twice daily for 5 days. 08/29/20 09/03/20  Lannie Fields, PA-C  OVER THE COUNTER MEDICATION Knock Out All Natural Sleep Aid    [provider]  Semaglutide (RYBELSUS) 7 MG TABS Take 1 tablet by mouth daily. 11/22/19   [provider]  tiZANidine (ZANAFLEX) 4 MG tablet TAKE 1 TABLET AT BEDTIME 06/05/20    Venia Carbon, MD  venlafaxine (EFFEXOR) 37.5 MG tablet TAKE 1 TABLET TWICE DAILY 12/12/19   Venia Carbon, MD    Allergies Diphenhydramine hcl, Fluoxetine hcl, Lisinopril, and Sertraline hcl  Family History  Problem Relation Age of Onset   Heart attack Father    Coronary artery disease Father    Kidney cancer Mother    Lung cancer Brother    Liver cancer Brother    Diabetes Other        Maternal side   Brain cancer Daughter    Hypertension Neg Hx    Breast cancer Neg Hx    Colon cancer Neg Hx     Social History Social History   Tobacco Use   Smoking status: Never   Smokeless tobacco: Never  Vaping Use   Vaping Use: Never used  Substance Use Topics   Alcohol use: No    Alcohol/week: 0.0 standard drinks   Drug use: No    Review of Systems  Review of Systems  Constitutional:  Positive for chills, fever and malaise/fatigue.  HENT:  Negative for sore throat.   Eyes:  Negative for pain.  Respiratory:  Positive for cough (mild). Negative for stridor.   Cardiovascular:  Negative for chest pain.  Gastrointestinal:  Positive for diarrhea, nausea and vomiting.  Genitourinary:  Negative for dysuria.  Musculoskeletal:  Positive for myalgias.  Skin:  Negative for rash.  Neurological:  Negative for seizures, loss of consciousness and headaches.  Psychiatric/Behavioral:  Negative for suicidal ideas.   All other systems reviewed and are negative.    ____________________________________________   PHYSICAL EXAM:  VITAL SIGNS: ED Triage Vitals  Enc Vitals Group     BP 08/30/20 1957 (!) 161/68     Pulse Rate 08/30/20 1949 94     Resp 08/30/20 1949 18     Temp 08/30/20 1949 99.2 F (37.3 C)     Temp Source 08/30/20 2308 Oral     SpO2 08/30/20 1949 98 %     Weight 08/30/20 1945 195 lb (88.5 kg)     Height 08/30/20 1945 '5\' 4"'$  (1.626 m)     Head Circumference --      Peak Flow --      Pain Score 08/30/20 1945 10     Pain Loc --      Pain Edu? --      Excl. in  [place]? --    Vitals:   08/31/20 1230 08/31/20 1300  BP: 120/64 130/67  Pulse: 73 72  Resp: 19 18  Temp:    SpO2: 96% 97%   Physical Exam Vitals and nursing note reviewed.  Constitutional:      General: She is not in acute distress.    Appearance: She is well-developed.  HENT:     Head: Normocephalic and atraumatic.     Right Ear: External ear normal.     Left Ear:  External ear normal.     Nose: Nose normal.     Mouth/Throat:     Mouth: Mucous membranes are dry.  Eyes:     Conjunctiva/sclera: Conjunctivae normal.  Cardiovascular:     Rate and Rhythm: Normal rate and regular rhythm.     Heart sounds: No murmur heard. Pulmonary:     Effort: Pulmonary effort is normal. No respiratory distress.     Breath sounds: Normal breath sounds.  Abdominal:     Palpations: Abdomen is soft.     Tenderness: There is no abdominal tenderness.  Musculoskeletal:     Cervical back: Neck supple.  Skin:    General: Skin is warm and dry.     Capillary Refill: Capillary refill takes 2 to 3 seconds.  Neurological:     Mental Status: She is alert and oriented to person, place, and time.  Psychiatric:        Mood and Affect: Mood normal.    Cranial nerves II through XII grossly intact.  No pronator drift.  No finger dysmetria.  Symmetric 5/5 strength of all extremities.  Sensation intact to light touch in all extremities.  Unremarkable unassisted gait.  ____________________________________________   LABS (all labs ordered are listed, but only abnormal results are displayed)  Labs Reviewed  BASIC METABOLIC PANEL - Abnormal; Notable for the following components:      Result Value   Sodium 134 (*)    Glucose, Bld 66 (*)    Creatinine, Ser 1.20 (*)    Calcium 8.1 (*)    GFR, Estimated 46 (*)    All other components within normal limits  CBC - Abnormal; Notable for the following components:   WBC 3.8 (*)    RBC 3.32 (*)    Hemoglobin 10.5 (*)    HCT 31.6 (*)    Platelets 124 (*)    All  other components within normal limits  MAGNESIUM - Abnormal; Notable for the following components:   Magnesium 1.4 (*)    All other components within normal limits  BASIC METABOLIC PANEL - Abnormal; Notable for the following components:   Sodium 130 (*)    Creatinine, Ser 1.28 (*)    Calcium 7.6 (*)    GFR, Estimated 43 (*)    All other components within normal limits  TROPONIN I (HIGH SENSITIVITY) - Abnormal; Notable for the following components:   Troponin I (High Sensitivity) 23 (*)    All other components within normal limits  TROPONIN I (HIGH SENSITIVITY) - Abnormal; Notable for the following components:   Troponin I (High Sensitivity) 23 (*)    All other components within normal limits  TSH  URINALYSIS, COMPLETE (UACMP) WITH MICROSCOPIC  CBG MONITORING, ED  CBG MONITORING, ED  CBG MONITORING, ED  CBG MONITORING, ED   ____________________________________________  EKG  She will ECG with fair amount of artifact.  Repeat shows sinus rhythm with a ventricular of 72 without evidence of ischemia or significant arrhythmia.  Left axis deviation. ____________________________________________  RADIOLOGY  ED MD interpretation: CT head is unremarkable for months of acute intracranial process including mass-effect, subacute CVA or other clear acute process.  Official radiology report(s): CT Head Wo Contrast  Result Date: 08/31/2020 CLINICAL DATA:  Headache with intracranial hemorrhage suspected. Generalized unwell feeling. EXAM: CT HEAD WITHOUT CONTRAST TECHNIQUE: Contiguous axial images were obtained from the base of the skull through the vertex without intravenous contrast. COMPARISON:  06/29/2014 FINDINGS: Brain: No evidence of acute infarction, hemorrhage, hydrocephalus, extra-axial collection or  mass lesion/mass effect. Vascular: No hyperdense vessel or unexpected calcification. Skull: Normal. Negative for fracture or focal lesion. Sinuses/Orbits: Scleral band on the right. Bilateral  cataract resection. IMPRESSION: No acute finding or change from 2016. Electronically Signed   By: Monte Fantasia M.D.   On: 08/31/2020 09:38    ____________________________________________   PROCEDURES  Procedure(s) performed (including Critical Care):  .1-3 Lead EKG Interpretation  Date/Time: 08/31/2020 1:07 PM Performed by: Lucrezia Starch, MD Authorized by: Lucrezia Starch, MD     Interpretation: normal     ECG rate assessment: normal     Rhythm: sinus rhythm     Ectopy: none     Conduction: normal     ____________________________________________   INITIAL IMPRESSION / ASSESSMENT AND PLAN / ED COURSE      Patient presents with above to history exam for assessment of generalized weakness and some persistent GI symptoms after recent normal diagnosis of COVID-19.  She was prescribed Paxil but when she was seen in the ED 2 days ago.  Since then she states she does not really keep much of anything down and has had some vomiting diarrhea.  On arrival she is afebrile hemodynamically stable.  Suspect symptoms likely related to ongoing COVID infection.  She has not been taking any antiemetics.  She has no new cough or shortness of breath to suggest acute bacterial pneumonia on top of possible COVID-pneumonia.  She states her cough is minimal and review of x-ray 2 days ago shows some mild patchiness bilaterally likely representing COVID pneumonia.  She was ambulated and did not have any evidence of hypoxia given absence of tachycardia tachypnea or new chest pain or shortness of breath Evalose patient for acute PE.  BMP shows no significant electrolyte or metabolic derangements this morning.  TSH is unremarkable.  Magnesium is 1.4.  This was repleted.  Troponin is minimally elevated 23 suspect likely representing mild demand in the setting of stress from COVID possibly related to low mag.  Magnesium repleted.  Repeat troponin is stable.  Patient tolerating p.o. after Zofran.  Given stable  vitals without evidence of hypoxia and patient tolerating p.o. I think she is stable for discharge with outpatient follow-up.  Advised her to discontinue Paxil but given concern for possible GI side effects and write Rx for Zofran.  Discharged stable condition.  Strict return precautions advised and discussed.      ____________________________________________   FINAL CLINICAL IMPRESSION(S) / ED DIAGNOSES  Final diagnoses:  COVID  Hypomagnesemia  Mild dehydration  Nausea vomiting and diarrhea    Medications  magnesium sulfate IVPB 4 g 100 mL (4 g Intravenous New Bag/Given 08/31/20 1203)  acetaminophen (TYLENOL) tablet 650 mg (650 mg Oral Given 08/31/20 0211)  ondansetron (ZOFRAN-ODT) disintegrating tablet 4 mg (4 mg Oral Given 08/31/20 0915)  acetaminophen (TYLENOL) tablet 1,000 mg (1,000 mg Oral Given 08/31/20 0915)     ED Discharge Orders     None        Note:  This document was prepared using Dragon voice recognition software and may include unintentional dictation errors.    Lucrezia Starch, MD 08/31/20 [phone]

## 2020-08-31 NOTE — ED Notes (Signed)
sats down to 95% on RA during ambulation

## 2020-08-31 NOTE — ED Notes (Signed)
Pt given cup of OJ

## 2020-08-31 NOTE — ED Notes (Signed)
Electronic signature pad not available- computer in room broken. Verbal consent for discharge obtained from pt at this time.

## 2020-09-03 NOTE — Addendum Note (Signed)
Addended by: Pilar Grammes on: 09/03/2020 10:53 AM   Modules accepted: Orders

## 2020-09-03 NOTE — Telephone Encounter (Signed)
Spoke to pt. She said she does not feel "right" She could not take the Paxlovid. She said broth causes diarrhea so she stopped. She had fluids at the last ER Visit 08-30-20. She is able to keep fluids down. Her son is sick also. Hard to get up and get food. She is asking what to do about her medications as she has not been on any of them since she has been sick. I suggested she slowly start them back as she is feeling better.She cannot check her blood sugar. She ate some crackers. I suggested to try to keep eating bland foods as tolerated. Said she may need to go to go to the ER for more fluids but she said she would not go back.

## 2020-09-11 ENCOUNTER — Telehealth: Payer: Self-pay

## 2020-09-11 NOTE — Telephone Encounter (Signed)
Pt son [name] and pt on the phone; pt is having a lot of watery diarrhea; cannot tell me how many watery diarrhea stools pt has had in last 12 hours; cannot tell me how many times pt has vomited and when the last time pt vomited. ED doctor gave pt paxlovid and pt took one paxlovid. And BS bottomed out and started with diarrhea after 2nd ED visit on 08/31/20 and pt does not want to take anymore paxlovid.  [name] said did home covid test that was + 8 - 9 days ago. Pt is not eating or drinking well. Has not cked BS in not sure how long. Pt does not have thermometer but is wrapped up in a blanket freezing right now but does not think she has fever. [name] said he has tried to encourage pt to drink water and gingerale but said pt was drinking several cans of diet pepsi a day; [name] said pt had stopped the diet pepsi but not sure when. Ondansetron is not helping n&V. [name] said pt has not taken [place] AT ED ON 08/31/20. [name] does not have way to ck temp, BS or BP. Pt does have abd pain and dizziness; I am not sure about dry mouth. [name] first said pt wanted to go to [facility] ED and when I recommended that pt go back for eval and possible IV fluids pt son said he would wait and see how she feels and pt said if she did not feel better in the morning she would go to ED. ED precautions once again given and pt and pts son voiced understanding. Will send note to Dr Silvio Pate and Larene Beach CMA.

## 2020-09-12 NOTE — Telephone Encounter (Signed)
Spoke to pt's son. Advised him of Dr Alla German note. He said he got a glucometer, thermometer, and blood pressure cuff. Said he has her drinking water, pedialyte, and eating mashe potatoes. Hoping not to have to take her to the ER, again. I advised there was not anything we would be able to do for her in the office. If her symptoms continue, the ER is where she needs to go.

## 2020-09-25 ENCOUNTER — Telehealth: Payer: Self-pay | Admitting: *Deleted

## 2020-09-25 NOTE — Telephone Encounter (Signed)
Patient called stating that she had covid last month. Patient stated that she still has not been able to eat much. Patient stated that she has only been able to eat jello and mashed potatoes. Patient stated that she is having diarrhea everyday and has to take imodium before very meal and after eating. Patient stated that she has not been checking her sugar because she does not know how to use the machine. Patient stated that she has not been taking any of her medications because it hurts her stomach. Patient stated that her skin is real dry. Patient stated that she is drinking lots of fluids but having diarrhea every day. Patient was advised that she may be dehydrated and need IV fluids. Patient was advised that she should go to the ER. Patient stated that she has been to the ER and they did not do much for her. Patient stated that she is not going to the ER. Patient wants to know if Dr. Silvio Pate will refer her to a specialist.

## 2020-09-26 ENCOUNTER — Ambulatory Visit: Payer: Medicare PPO | Admitting: Internal Medicine

## 2020-09-26 NOTE — Telephone Encounter (Signed)
Left message on verified vm.

## 2020-09-26 NOTE — Telephone Encounter (Signed)
Venia Carbon, MD to Pete Pelt, LPN  Pilar Grammes, CMA     10:51 AM  It does sound like she should go to the ER.  If nothing else, she should stop the imodium immediately.  I don't have anything else to day---and am out tomorrow. See if someone else can fit her in today or tomorrow     Pt called back and notified as instructed; pt voiced understanding and will go to ED tomorrow; pt said right now she is nauseated and does not feel like sitting up. Pt said she will go to [facility] ED on 09/27/20; pt said she does not feel like going anywhere today. Explained again why she needs to go to ED today since pt has not taken any med including diabetic meds since 09/19/20. Pt again said will wait until tomorrown to go to ED. Will send note to Dr Silvio Pate and Larene Beach CMA.

## 2020-09-27 DIAGNOSIS — I10 Essential (primary) hypertension: Secondary | ICD-10-CM | POA: Diagnosis not present

## 2020-09-27 DIAGNOSIS — R1032 Left lower quadrant pain: Secondary | ICD-10-CM | POA: Diagnosis not present

## 2020-09-27 DIAGNOSIS — E119 Type 2 diabetes mellitus without complications: Secondary | ICD-10-CM | POA: Diagnosis not present

## 2020-09-27 DIAGNOSIS — R197 Diarrhea, unspecified: Secondary | ICD-10-CM | POA: Diagnosis not present

## 2020-09-27 DIAGNOSIS — K5730 Diverticulosis of large intestine without perforation or abscess without bleeding: Secondary | ICD-10-CM | POA: Diagnosis not present

## 2020-09-27 DIAGNOSIS — U071 COVID-19: Secondary | ICD-10-CM | POA: Diagnosis not present

## 2020-09-27 NOTE — Telephone Encounter (Signed)
Spoke to pt. She said she is not any better. She said she will go to the ER later today.

## 2020-10-08 ENCOUNTER — Telehealth: Payer: Self-pay | Admitting: Internal Medicine

## 2020-10-08 DIAGNOSIS — K529 Noninfective gastroenteritis and colitis, unspecified: Secondary | ICD-10-CM

## 2020-10-08 NOTE — Telephone Encounter (Signed)
Please advise patient on what she should do next.

## 2020-10-08 NOTE — Telephone Encounter (Signed)
Pt called she is wanting to have a referral placed to [facility] for her diverticulitis  Also she wanted to know if there was a medication or something she can do to help her eat

## 2020-10-09 ENCOUNTER — Telehealth: Payer: Self-pay | Admitting: Internal Medicine

## 2020-10-09 NOTE — Addendum Note (Signed)
Addended by: [name] I on: 10/09/2020 01:18 PM   Modules accepted: Orders

## 2020-10-09 NOTE — Telephone Encounter (Signed)
Patient called and informed about Dr. Everardo Beals recommendations and information and the patient stated she would still like a referral to [facility] but she stated that she still would like to see Dr. Silvio Pate in which she was scheduled for the 7th of September.

## 2020-10-09 NOTE — Telephone Encounter (Signed)
Patient returned your call .Would like a call back

## 2020-10-09 NOTE — Telephone Encounter (Signed)
Let her know that I put in the referral but it could be some time till she hears about it

## 2020-10-09 NOTE — Telephone Encounter (Signed)
Called patient and she stated that her diverticulitis has been reoccurring everyday and has been having diarrhea every day for the last 6 weeks. Patient stated she has had Covid 2 times in the last 6 weeks. Please advise.

## 2020-10-09 NOTE — Telephone Encounter (Signed)
Tried calling patient she did not answer LVM for her to call back.

## 2020-10-09 NOTE — Telephone Encounter (Signed)
Called patient and completed this encounter in original note.

## 2020-10-09 NOTE — Telephone Encounter (Signed)
Patient was already informed that the referral process could take awhile.

## 2020-10-16 ENCOUNTER — Encounter: Payer: Self-pay | Admitting: Internal Medicine

## 2020-10-16 ENCOUNTER — Ambulatory Visit: Payer: Medicare PPO | Admitting: Internal Medicine

## 2020-10-16 ENCOUNTER — Other Ambulatory Visit: Payer: Self-pay

## 2020-10-16 VITALS — BP 128/80 | HR 82 | Temp 97.9°F | Ht 64.0 in | Wt 175.0 lb

## 2020-10-16 DIAGNOSIS — E1165 Type 2 diabetes mellitus with hyperglycemia: Secondary | ICD-10-CM

## 2020-10-16 DIAGNOSIS — R197 Diarrhea, unspecified: Secondary | ICD-10-CM

## 2020-10-16 DIAGNOSIS — IMO0002 Reserved for concepts with insufficient information to code with codable children: Secondary | ICD-10-CM

## 2020-10-16 DIAGNOSIS — E1140 Type 2 diabetes mellitus with diabetic neuropathy, unspecified: Secondary | ICD-10-CM | POA: Diagnosis not present

## 2020-10-16 DIAGNOSIS — E441 Mild protein-calorie malnutrition: Secondary | ICD-10-CM

## 2020-10-16 LAB — HEPATIC FUNCTION PANEL
ALT: 13 U/L (ref 0–35)
AST: 21 U/L (ref 0–37)
Albumin: 3.1 g/dL — ABNORMAL LOW (ref 3.5–5.2)
Alkaline Phosphatase: 62 U/L (ref 39–117)
Bilirubin, Direct: 0.3 mg/dL (ref 0.0–0.3)
Total Bilirubin: 0.8 mg/dL (ref 0.2–1.2)
Total Protein: 7 g/dL (ref 6.0–8.3)

## 2020-10-16 LAB — RENAL FUNCTION PANEL
Albumin: 3.1 g/dL — ABNORMAL LOW (ref 3.5–5.2)
BUN: 6 mg/dL (ref 6–23)
CO2: 28 mEq/L (ref 19–32)
Calcium: 8.5 mg/dL (ref 8.4–10.5)
Chloride: 98 mEq/L (ref 96–112)
Creatinine, Ser: 1.01 mg/dL (ref 0.40–1.20)
GFR: 53.09 mL/min — ABNORMAL LOW (ref >60.00)
Glucose, Bld: 234 mg/dL — ABNORMAL HIGH (ref 70–99)
Phosphorus: 3.1 mg/dL (ref 2.3–4.6)
Potassium: 3.7 mEq/L (ref 3.5–5.1)
Sodium: 136 mEq/L (ref 135–145)

## 2020-10-16 LAB — CBC
HCT: 33.6 % — ABNORMAL LOW (ref 36.0–46.0)
Hemoglobin: 11.3 g/dL — ABNORMAL LOW (ref 12.0–15.0)
MCHC: 33.6 g/dL (ref 30.0–36.0)
MCV: 93.9 fl (ref 78.0–100.0)
Platelets: 197 10*3/uL (ref 150.0–400.0)
RBC: 3.58 Mil/uL — ABNORMAL LOW (ref 3.87–5.11)
RDW: 13.8 % (ref 11.5–15.5)
WBC: 6.3 10*3/uL (ref 4.0–10.5)

## 2020-10-16 LAB — POCT GLYCOSYLATED HEMOGLOBIN (HGB A1C): Hemoglobin A1C: 7.8 % — AB (ref 4.0–5.6)

## 2020-10-16 MED ORDER — ACCU-CHEK SOFTCLIX LANCETS MISC: 3 refills | Status: AC

## 2020-10-16 MED ORDER — ACCU-CHEK SOFTCLIX LANCET DEV KIT: 1.0000 | PACK | Freq: Once | 0 refills | Status: AC

## 2020-10-16 MED ORDER — HYOSCYAMINE SULFATE 0.125 MG PO TABS
0.1250 mg | ORAL_TABLET | Freq: Three times a day (TID) | ORAL | 0 refills | Status: DC | PRN
Start: 2020-10-16 — End: 2022-11-05

## 2020-10-16 MED ORDER — ACCU-CHEK GUIDE W/DEVICE KIT: 1.0000 | PACK | Freq: Once | 0 refills | Status: AC

## 2020-10-16 MED ORDER — ONDANSETRON 4 MG PO TBDP: 4.0000 mg | ORAL_TABLET | Freq: Three times a day (TID) | ORAL | 0 refills | Status: DC | PRN

## 2020-10-16 MED ORDER — ACCU-CHEK GUIDE VI STRP: ORAL_STRIP | 3 refills | Status: AC

## 2020-10-16 NOTE — Assessment & Plan Note (Addendum)
Off all meds for a while now Will check A1c to give some idea about status  Lab Results  Component Value Date   HGBA1C 7.8 (A) 10/16/2020   A1c actually better Will hold off restarting any meds--and may want to reconsider the metformin

## 2020-10-16 NOTE — Progress Notes (Signed)
Subjective:    Patient ID: [name], female    DOB: August 25, 1940, 80 y.o.   MRN: [id number]  HPI Here due to persistent diarrhea Son [name] is here This visit occurred during the SARS-CoV-2 public health emergency.  Safety protocols were in place, including screening questions prior to the visit, additional usage of staff PPE, and extensive cleaning of exam room while observing appropriate contact time as indicated for disinfecting solutions.   Has been to ER 3 times First due to [place] in July at [facility] then back 2 days later Did have paxlovid--only took one dose (then had to stop due to GI problems) Had nausea, diarrhea and unable to eat---but has had GI issues for years (diarrhea several days a week)  Then went to [facility]-- 8/19 Diagnosed with cirrhosis (at least on the paper)--but nothing on the CT Given augmentin for possible diverticulitis---this made diarrhea, etc worse  Now with nausea---but no vomiting Has diarrhea after eating ---most meals Son has had her taking imodium before eating May still get diarrhea despite the imodium Now 1-2 imodium a day  Now not taking any of her medications Stopped rybelsus after COVID--and now off all meds for a while  Not checking sugars Drinking fluids--- but had been drinking lots of diet Pepsi--son working her off these  Current Outpatient Medications on File Prior to Visit  Medication Sig Dispense Refill   ondansetron (ZOFRAN ODT) 4 MG disintegrating tablet Take 1 tablet (4 mg total) by mouth every 8 (eight) hours as needed for nausea or vomiting. 20 tablet 0   aspirin 81 MG tablet Take 81 mg by mouth daily. (Patient not taking: Reported on 10/16/2020)     Cyanocobalamin (VITAMIN B12 PO) Take by mouth. (Patient not taking: Reported on 10/16/2020)     doxazosin (CARDURA) 4 MG tablet TAKE 1/2 TABLET TWICE DAILY (Patient not taking: Reported on 10/16/2020) 90 tablet 3   FeFum-FePoly-FA-B Cmp-C-Biot (INTEGRA PLUS) CAPS Take 1 capsule by mouth  every morning. (Patient not taking: Reported on 10/16/2020) 30 capsule 2   gabapentin (NEURONTIN) 100 MG capsule TAKE 1 CAPSULE TWICE DAILY (Patient not taking: Reported on 10/16/2020) 180 capsule 3   glipiZIDE (GLUCOTROL XL) 10 MG 24 hr tablet TAKE 1 TABLET TWICE DAILY (Patient not taking: Reported on 10/16/2020) 180 tablet 3   Glucosamine-Chondroitin (COSAMIN DS PO) Take by mouth. (Patient not taking: Reported on 10/16/2020)     losartan-hydrochlorothiazide (HYZAAR) 100-25 MG tablet TAKE 1 TABLET EVERY DAY (Patient not taking: Reported on 10/16/2020) 90 tablet 3   metFORMIN (GLUCOPHAGE-XR) 750 MG 24 hr tablet TAKE 2 TABLETS EVERY DAY WITH BREAKFAST (Patient not taking: Reported on 10/16/2020) 180 tablet 3   Multiple Vitamin (MULTIVITAMIN) tablet Take 1 tablet by mouth daily. (Patient not taking: Reported on 10/16/2020)     OVER THE COUNTER MEDICATION Knock Out All Natural Sleep Aid (Patient not taking: Reported on 10/16/2020)     tiZANidine (ZANAFLEX) 4 MG tablet TAKE 1 TABLET AT BEDTIME (Patient not taking: Reported on 10/16/2020) 90 tablet 1   venlafaxine (EFFEXOR) 37.5 MG tablet TAKE 1 TABLET TWICE DAILY (Patient not taking: Reported on 10/16/2020) 180 tablet 3   No current facility-administered medications on file prior to visit.    Allergies  Allergen Reactions   Diphenhydramine Hcl Other (See Comments)    hallucinations    Fluoxetine Hcl     REACTION: raised blood pressure   Lisinopril     REACTION: dizzy   Sertraline Hcl  REACTION: dizziness    Past Medical History:  Diagnosis Date   Allergic rhinitis    Anal fissure    Arthritis    Asthma    Depression    Diabetes mellitus    type 2   Diverticulosis    Fibromyalgia    GERD (gastroesophageal reflux disease)    Hypertension    Obesity    Osteoarthritis     Past Surgical History:  Procedure Laterality Date   ABDOMINAL HYSTERECTOMY  1970's   CATARACT EXTRACTION, BILATERAL     CHOLECYSTECTOMY  4/06   TUBAL LIGATION  1970's    VENTRAL HERNIA REPAIR  9/08   [place]    Family History  Problem Relation Age of Onset   Heart attack Father    Coronary artery disease Father    Kidney cancer Mother    Lung cancer Brother    Liver cancer Brother    Diabetes Other        Maternal side   Brain cancer Daughter    Hypertension Neg Hx    Breast cancer Neg Hx    Colon cancer Neg Hx     Social History   Socioeconomic History   Marital status: Divorced    Spouse name: Not on file   Number of children: 3   Years of education: Not on file   Highest education level: Not on file  Occupational History   Occupation: Scientist, research (physical sciences): RETIRED  Tobacco Use   Smoking status: Never   Smokeless tobacco: Never  Vaping Use   Vaping Use: Never used  Substance and Sexual Activity   Alcohol use: No    Alcohol/week: 0.0 standard drinks   Drug use: No   Sexual activity: Not on file  Other Topics Concern   Not on file  Social History Narrative   No living will   No health care POA but would want son, [name], to make decisions about her care if needed. Other child are alternatives   Would accept resuscitation attempts   Probably wouldn't want tube feeds if cognitively unaware   Social Determinants of Health   Financial Resource Strain: Not on file  Food Insecurity: Not on file  Transportation Needs: Not on file  Physical Activity: Not on file  Stress: Not on file  Social Connections: Not on file  Intimate Partner Violence: Not on file   Review of Systems No fever--but feels hot inside No blood in stools Does have some lower abdominal pain Back hurts bad Has lost 20# since COVID infection     Objective:   Physical Exam Constitutional:      Comments: Clear weight loss---looks ill but no distress  Cardiovascular:     Rate and Rhythm: Normal rate and regular rhythm.     Heart sounds: No murmur heard.   No gallop.  Pulmonary:     Effort: Pulmonary effort is normal.     Breath sounds: Normal breath  sounds. No wheezing or rales.  Abdominal:     General: Bowel sounds are normal.     Palpations: Abdomen is soft.     Tenderness: There is no abdominal tenderness.  Musculoskeletal:     Cervical back: Neck supple.  Lymphadenopathy:     Cervical: No cervical adenopathy.  Neurological:     Mental Status: She is alert.  Psychiatric:        Mood and Affect: Mood normal.           Assessment & Plan:

## 2020-10-16 NOTE — Assessment & Plan Note (Signed)
Quick weight loss of 20# Need to get her back with calorie intake (suggested boost/ensure, etc)

## 2020-10-16 NOTE — Assessment & Plan Note (Signed)
Long standing issues with IBS---now much worse since COVID Need to wean off the imodium Doesn't seem hypovolemic--but will check labs Try hyoscyamine instead--sparingly

## 2020-10-16 NOTE — Patient Instructions (Signed)
Please try boost or ensure along with food as tolerated to improve nutrition. Drink plenty of water Start testing sugars fasting in the morning---let me know if persistently over 200 in the morning

## 2020-10-16 NOTE — Addendum Note (Signed)
Addended by: Pilar Grammes on: 10/16/2020 02:53 PM   Modules accepted: Orders

## 2020-10-24 ENCOUNTER — Telehealth: Payer: Self-pay

## 2020-10-24 ENCOUNTER — Other Ambulatory Visit: Payer: Self-pay

## 2020-10-24 DIAGNOSIS — N1832 Chronic kidney disease, stage 3b: Secondary | ICD-10-CM | POA: Diagnosis not present

## 2020-10-24 DIAGNOSIS — M25551 Pain in right hip: Secondary | ICD-10-CM | POA: Insufficient documentation

## 2020-10-24 DIAGNOSIS — Z90710 Acquired absence of both cervix and uterus: Secondary | ICD-10-CM | POA: Diagnosis not present

## 2020-10-24 DIAGNOSIS — E119 Type 2 diabetes mellitus without complications: Secondary | ICD-10-CM | POA: Insufficient documentation

## 2020-10-24 DIAGNOSIS — I10 Essential (primary) hypertension: Secondary | ICD-10-CM | POA: Diagnosis not present

## 2020-10-24 DIAGNOSIS — R109 Unspecified abdominal pain: Secondary | ICD-10-CM | POA: Diagnosis not present

## 2020-10-24 DIAGNOSIS — M5137 Other intervertebral disc degeneration, lumbosacral region: Secondary | ICD-10-CM | POA: Diagnosis not present

## 2020-10-24 DIAGNOSIS — Z8616 Personal history of COVID-19: Secondary | ICD-10-CM | POA: Diagnosis not present

## 2020-10-24 DIAGNOSIS — R52 Pain, unspecified: Secondary | ICD-10-CM | POA: Diagnosis not present

## 2020-10-24 DIAGNOSIS — R110 Nausea: Secondary | ICD-10-CM | POA: Diagnosis not present

## 2020-10-24 DIAGNOSIS — Z7982 Long term (current) use of aspirin: Secondary | ICD-10-CM | POA: Diagnosis not present

## 2020-10-24 DIAGNOSIS — S3993XA Unspecified injury of pelvis, initial encounter: Secondary | ICD-10-CM | POA: Diagnosis not present

## 2020-10-24 DIAGNOSIS — R739 Hyperglycemia, unspecified: Secondary | ICD-10-CM | POA: Diagnosis not present

## 2020-10-24 DIAGNOSIS — Z7984 Long term (current) use of oral hypoglycemic drugs: Secondary | ICD-10-CM | POA: Insufficient documentation

## 2020-10-24 DIAGNOSIS — I129 Hypertensive chronic kidney disease with stage 1 through stage 4 chronic kidney disease, or unspecified chronic kidney disease: Secondary | ICD-10-CM | POA: Diagnosis not present

## 2020-10-24 DIAGNOSIS — J45909 Unspecified asthma, uncomplicated: Secondary | ICD-10-CM | POA: Insufficient documentation

## 2020-10-24 DIAGNOSIS — Z85828 Personal history of other malignant neoplasm of skin: Secondary | ICD-10-CM | POA: Insufficient documentation

## 2020-10-24 DIAGNOSIS — Z043 Encounter for examination and observation following other accident: Secondary | ICD-10-CM | POA: Diagnosis not present

## 2020-10-24 DIAGNOSIS — R197 Diarrhea, unspecified: Secondary | ICD-10-CM | POA: Insufficient documentation

## 2020-10-24 DIAGNOSIS — M549 Dorsalgia, unspecified: Secondary | ICD-10-CM | POA: Diagnosis not present

## 2020-10-24 DIAGNOSIS — M5136 Other intervertebral disc degeneration, lumbar region: Secondary | ICD-10-CM | POA: Diagnosis not present

## 2020-10-24 DIAGNOSIS — E876 Hypokalemia: Secondary | ICD-10-CM | POA: Insufficient documentation

## 2020-10-24 LAB — COMPREHENSIVE METABOLIC PANEL
ALT: 25 U/L (ref 0–44)
AST: 41 U/L (ref 15–41)
Albumin: 3.2 g/dL — ABNORMAL LOW (ref 3.5–5.0)
Alkaline Phosphatase: 66 U/L (ref 38–126)
Anion gap: 9 (ref 5–15)
BUN: 12 mg/dL (ref 8–23)
CO2: 27 mmol/L (ref 22–32)
Calcium: 8.9 mg/dL (ref 8.9–10.3)
Chloride: 98 mmol/L (ref 98–111)
Creatinine, Ser: 1.03 mg/dL — ABNORMAL HIGH (ref 0.44–1.00)
GFR, Estimated: 55 mL/min — ABNORMAL LOW (ref >60)
Glucose, Bld: 260 mg/dL — ABNORMAL HIGH (ref 70–99)
Potassium: 3.1 mmol/L — ABNORMAL LOW (ref 3.5–5.1)
Sodium: 134 mmol/L — ABNORMAL LOW (ref 135–145)
Total Bilirubin: 1.2 mg/dL (ref 0.3–1.2)
Total Protein: 7.3 g/dL (ref 6.5–8.1)

## 2020-10-24 LAB — CBC
HCT: 34.2 % — ABNORMAL LOW (ref 36.0–46.0)
Hemoglobin: 12 g/dL (ref 12.0–15.0)
MCH: 33 pg (ref 26.0–34.0)
MCHC: 35.1 g/dL (ref 30.0–36.0)
MCV: 94 fL (ref 80.0–100.0)
Platelets: 219 10*3/uL (ref 150–400)
RBC: 3.64 MIL/uL — ABNORMAL LOW (ref 3.87–5.11)
RDW: 13.6 % (ref 11.5–15.5)
WBC: 6 10*3/uL (ref 4.0–10.5)
nRBC: 0 % (ref 0.0–0.2)

## 2020-10-24 LAB — LIPASE, BLOOD: Lipase: 21 U/L (ref 11–51)

## 2020-10-24 LAB — CBG MONITORING, ED: Glucose-Capillary: 198 mg/dL — ABNORMAL HIGH (ref 70–99)

## 2020-10-24 NOTE — ED Triage Notes (Signed)
Pt arrives via [facility] with CC on diarrhea since 7/21. Pt had been taking Imodium and was seen by PCP - advised to d/c Imodium and was prescribed something else with no relief. Secondary CC of lower back pain that has persisted since mechanical fall on 9/7.

## 2020-10-24 NOTE — Telephone Encounter (Signed)
[facility] - Client TELEPHONE ADVICE RECORD [facility] Patient Name: [name] STEP HENS Gender: Female DOB: Aug 08, 1940 Age: 80 Y 87 M 25 D Return Phone Number: [phone] (Primary), [phone] (Secondary) Address: City/ State/ Zip: [place] [address] Client [facility] - Client Client Site [facility] - Day Physician [name]- MD Contact Type Call Who Is Calling Patient / Member / Family / Caregiver Call Type Triage / Clinical Caller Name MICHAEL Ehler Relationship To Patient Son Return Phone Number [phone] (Primary) Chief Complaint Back Pain - General Reason for Call Symptomatic / Request for Health Information Initial Comment Caller states patient fell out of the bed. She states she has back pain. She states she can not sleep because of the pain. Translation No Nurse Assessment Nurse: Raphael Gibney, RN, Vera Date/Time (Eastern Time): 10/24/2020 1:13:35 PM Confirm and document reason for call. If symptomatic, describe symptoms. ---Caller states mother fell out of bed Sept 3. back pain is getting worse. past 2-3 days, she is unable to sleep due to pain. Has pain in her lower back and her right hip. pain level 10. Does the patient have any new or worsening symptoms? ---Yes Will a triage be completed? ---Yes Related visit to physician within the last 2 weeks? ---No Does the PT have any chronic conditions? (i.e. diabetes, asthma, this includes High risk factors for pregnancy, etc.) ---Yes List chronic conditions. ---diabetes; diverticulitis Is this a behavioral health or substance abuse call? ---No Guidelines Guideline Title Affirmed Question Affirmed Notes Nurse Date/Time (Eastern Time) Back Injury [1] Unable to urinate (or only a few drops) > 4 hours AND [2] bladder feels very full (e.g., palpable bladder or strong urge to urinate) Raphael Gibney, RN, Vanita Ingles 10/24/2020  1:19:34 PM PLEASE NOTE: All timestamps contained within this report are represented as [place] Standard Time. CONFIDENTIALTY NOTICE: This fax transmission is intended only for the addressee. It contains information that is legally privileged, confidential or otherwise protected from use or disclosure. If you are not the intended recipient, you are strictly prohibited from reviewing, disclosing, copying using or disseminating any of this information or taking any action in reliance on or regarding this information. If you have received this fax in error, please notify us immediately by telephone so that we can arrange for its return to [place]. Phone: [phone], Toll-Free: [phone], Fax: [phone] Page: 2 of 2 Call Id: [id number] [place]. Time Eilene Ghazi Time) Disposition Final User 10/24/2020 1:24:46 PM Go to ED Now Yes Raphael Gibney, RN, Doreatha Lew Disagree/Comply Comply Caller Understands Yes PreDisposition Call Doctor Care Advice Given Per Guideline GO TO ED NOW: * You need to be seen in the Emergency Department. ANOTHER ADULT SHOULD DRIVE: * It is better and safer if another adult drives instead of you. CARE ADVICE given per Back Injury (Adult) guideline. Referrals [facility] - ED

## 2020-10-24 NOTE — Telephone Encounter (Signed)
I spoke with [name] (do not see DPR signed) and he cannot get pt out of home and does not have w/c but [name] is going to call EMTs to see if they can get pt out of home and if not [name] will call 911 and pt will go by ambulance to [facility] ED. Sending note to Dr Silvio Pate and Larene Beach CMA.

## 2020-10-24 NOTE — Telephone Encounter (Signed)
I will await the ER evaluation

## 2020-10-24 NOTE — ED Triage Notes (Signed)
First Nurse: Pt brought in by [facility] with c/o back pain for two weeks after the fall. She refused to come in then pain is just getting worse.

## 2020-10-25 ENCOUNTER — Telehealth: Payer: Self-pay

## 2020-10-25 ENCOUNTER — Emergency Department: Payer: Medicare PPO

## 2020-10-25 ENCOUNTER — Emergency Department
Admission: EM | Admit: 2020-10-25 | Discharge: 2020-10-25 | Disposition: A | Discharge status: Home / Self Care | Payer: Medicare PPO | Attending: Emergency Medicine | Admitting: Emergency Medicine

## 2020-10-25 DIAGNOSIS — M5136 Other intervertebral disc degeneration, lumbar region: Secondary | ICD-10-CM | POA: Diagnosis not present

## 2020-10-25 DIAGNOSIS — E876 Hypokalemia: Secondary | ICD-10-CM | POA: Diagnosis not present

## 2020-10-25 DIAGNOSIS — S3993XA Unspecified injury of pelvis, initial encounter: Secondary | ICD-10-CM | POA: Diagnosis not present

## 2020-10-25 DIAGNOSIS — R197 Diarrhea, unspecified: Secondary | ICD-10-CM

## 2020-10-25 DIAGNOSIS — W19XXXA Unspecified fall, initial encounter: Secondary | ICD-10-CM

## 2020-10-25 DIAGNOSIS — Z043 Encounter for examination and observation following other accident: Secondary | ICD-10-CM | POA: Diagnosis not present

## 2020-10-25 DIAGNOSIS — Z90710 Acquired absence of both cervix and uterus: Secondary | ICD-10-CM | POA: Diagnosis not present

## 2020-10-25 DIAGNOSIS — M5137 Other intervertebral disc degeneration, lumbosacral region: Secondary | ICD-10-CM | POA: Diagnosis not present

## 2020-10-25 LAB — URINALYSIS, COMPLETE (UACMP) WITH MICROSCOPIC
Bacteria, UA: NONE SEEN
Bilirubin Urine: NEGATIVE
Glucose, UA: 50 mg/dL — AB
Hgb urine dipstick: NEGATIVE
Ketones, ur: 5 mg/dL — AB
Leukocytes,Ua: NEGATIVE
Nitrite: NEGATIVE
Protein, ur: 100 mg/dL — AB
Specific Gravity, Urine: 1.012 (ref 1.005–1.030)
pH: 5 (ref 5.0–8.0)

## 2020-10-25 LAB — MAGNESIUM: Magnesium: 1.8 mg/dL (ref 1.7–2.4)

## 2020-10-25 MED ORDER — HYDROMORPHONE HCL 1 MG/ML IJ SOLN
0.5000 mg | Freq: Once | INTRAMUSCULAR | Status: AC
  Administered 2020-10-25: 0.5 mg via INTRAVENOUS
  Filled 2020-10-25: qty 1

## 2020-10-25 MED ORDER — POTASSIUM CHLORIDE CRYS ER 20 MEQ PO TBCR: 20.0000 meq | EXTENDED_RELEASE_TABLET | Freq: Two times a day (BID) | ORAL | 0 refills | Status: DC

## 2020-10-25 MED ORDER — POTASSIUM CHLORIDE 10 MEQ/100ML IV SOLN
10.0000 meq | INTRAVENOUS | Status: AC
Start: 2020-10-25 — End: 2020-10-25
  Administered 2020-10-25 (×2): 10 meq via INTRAVENOUS
  Filled 2020-10-25 (×2): qty 100

## 2020-10-25 MED ORDER — SODIUM CHLORIDE 0.9 % IV BOLUS
1000.0000 mL | Freq: Once | INTRAVENOUS | Status: AC
  Administered 2020-10-25: 1000 mL via INTRAVENOUS

## 2020-10-25 MED ORDER — IBUPROFEN 400 MG PO TABS
400.0000 mg | ORAL_TABLET | Freq: Once | ORAL | Status: AC
  Administered 2020-10-25: 400 mg via ORAL
  Filled 2020-10-25: qty 1

## 2020-10-25 NOTE — ED Notes (Signed)
Verbalized understanding discharge instructions, prescriptions, and follow-up. In no acute distress.   Son educated to Dietitian department or EMS for assistance getting Pt into the house.

## 2020-10-25 NOTE — Telephone Encounter (Signed)
Spoke to pt. She is doing okay. They want her to go to GI and ortho. Using a heating pad on her back.

## 2020-10-25 NOTE — ED Notes (Signed)
Pt states she is in severe pain and asked to talk to the Dr. Again.

## 2020-10-25 NOTE — ED Provider Notes (Addendum)
[facility]  ____________________________________________   Event Date/Time   First MD Initiated Contact with Patient 10/25/20 0041     (approximate)  I have reviewed the triage vital signs and the nursing notes.   HISTORY  Chief Complaint Diarrhea and Back Pain    HPI [name] is a 80 y.o. female with past medical history of diabetes, asthma, arthritis, CKD who presents with diarrhea.  Patient notes that she has had diarrhea since having COVID in July.  It is nonbloody.  She has multiple episodes per day.  She has associated abdominal cramping but no significant sustained pain.  She has had nausea as well but no vomiting.  She has seen her primary care physician for this, was initially taking Imodium but then was up and is now taking hyoscyamine, which is helping some.  She has lost about 20 pounds since July unintentionally.  She is tolerating p.o. but has diarrhea after everything she eats.  Patient also complains of right-sided lower back pain and hip pain since a fall several weeks ago.  She has not seen a provider for it.  She denies numbness or weakness in her lower extremities, bowel or bladder incontinence.         Past Medical History:  Diagnosis Date   Allergic rhinitis    Anal fissure    Arthritis    Asthma    Depression    Diabetes mellitus    type 2   Diverticulosis    Fibromyalgia    GERD (gastroesophageal reflux disease)    Hypertension    Obesity    Osteoarthritis     Patient Active Problem List   Diagnosis Date Noted   Diarrhea 10/16/2020   Malnutrition of mild degree ([place]) 10/16/2020   Stage 3b chronic kidney disease ([place]) 12/25/2016   Right hand pain 07/12/2015   Advance directive discussed with patient 07/12/2015   Iron deficiency anemia 07/18/2014   Type 2 diabetes, uncontrolled, with neuropathy ([place]) 02/22/2012   Routine general medical examination at a health care facility 08/25/2011   Osteoarthritis, knee  08/25/2011   Fibromyalgia 02/25/2011   Obesity 02/25/2011   UTERINE PROLAPSE 02/25/2010   GERD 02/13/2009   CARCINOMA, BASAL CELL 05/18/2008   ALLERGIC RHINITIS 05/18/2008   Episodic mood disorder ([place]) 06/20/2007   Essential hypertension, benign 10/27/2006    Past Surgical History:  Procedure Laterality Date   ABDOMINAL HYSTERECTOMY  1970's   CATARACT EXTRACTION, BILATERAL     CHOLECYSTECTOMY  4/06   TUBAL LIGATION  1970's   VENTRAL HERNIA REPAIR  9/08   [place]    Prior to Admission medications   Medication Sig Start Date End Date Taking? Authorizing Provider  potassium chloride SA (KLOR-CON) 20 MEQ tablet Take 1 tablet (20 mEq total) by mouth 2 (two) times daily for 5 days. 10/25/20 10/30/20 Yes Rada Hay, MD  Accu-Chek Softclix Lancets lancets Use to obtain blood sugar sample Dx Code E11.40 10/16/20   Venia Carbon, MD  aspirin 81 MG tablet Take 81 mg by mouth daily. Patient not taking: Reported on 10/16/2020    [provider]  Cyanocobalamin (VITAMIN B12 PO) Take by mouth. Patient not taking: Reported on 10/16/2020    [provider]  doxazosin (CARDURA) 4 MG tablet TAKE 1/2 TABLET TWICE DAILY Patient not taking: Reported on 10/16/2020 12/12/19   Venia Carbon, MD  FeFum-FePoly-FA-B Cmp-C-Biot (INTEGRA PLUS) CAPS Take 1 capsule by mouth every morning. Patient not taking: Reported on  10/16/2020 09/19/14   Curt Bears, MD  gabapentin (NEURONTIN) 100 MG capsule TAKE 1 CAPSULE TWICE DAILY Patient not taking: Reported on 10/16/2020 12/12/19   [name] I, MD  glipiZIDE (GLUCOTROL XL) 10 MG 24 hr tablet TAKE 1 TABLET TWICE DAILY Patient not taking: Reported on 10/16/2020 01/18/20   Venia Carbon, MD  Glucosamine-Chondroitin (COSAMIN DS PO) Take by mouth. Patient not taking: Reported on 10/16/2020    [provider]  glucose blood (ACCU-CHEK GUIDE) test strip Use to check blood sugar once daily Dx Code E11.40 10/16/20   Venia Carbon, MD   hyoscyamine (LEVSIN) 0.125 MG tablet Take 1 tablet (0.125 mg total) by mouth 3 (three) times daily as needed. Before meals. Don't use imodium also 10/16/20   Venia Carbon, MD  losartan-hydrochlorothiazide [facility]) 100-25 MG tablet TAKE 1 TABLET EVERY DAY Patient not taking: Reported on 10/16/2020 05/23/20   Venia Carbon, MD  metFORMIN (GLUCOPHAGE-XR) 750 MG 24 hr tablet TAKE 2 TABLETS EVERY DAY WITH BREAKFAST Patient not taking: Reported on 10/16/2020 01/18/20   Venia Carbon, MD  Multiple Vitamin (MULTIVITAMIN) tablet Take 1 tablet by mouth daily. Patient not taking: Reported on 10/16/2020    [provider]  ondansetron (ZOFRAN ODT) 4 MG disintegrating tablet Take 1 tablet (4 mg total) by mouth every 8 (eight) hours as needed for nausea or vomiting. 10/16/20   Venia Carbon, MD  OVER THE COUNTER MEDICATION Knock Out All Natural Sleep Aid Patient not taking: Reported on 10/16/2020    [provider]  tiZANidine (ZANAFLEX) 4 MG tablet TAKE 1 TABLET AT BEDTIME Patient not taking: Reported on 10/16/2020 06/05/20   Venia Carbon, MD  venlafaxine (EFFEXOR) 37.5 MG tablet TAKE 1 TABLET TWICE DAILY Patient not taking: Reported on 10/16/2020 12/12/19   Venia Carbon, MD    Allergies Diphenhydramine hcl, Fluoxetine hcl, Lisinopril, and Sertraline hcl  Family History  Problem Relation Age of Onset   Heart attack Father    Coronary artery disease Father    Kidney cancer Mother    Lung cancer Brother    Liver cancer Brother    Diabetes Other        Maternal side   Brain cancer Daughter    Hypertension Neg Hx    Breast cancer Neg Hx    Colon cancer Neg Hx     Social History Social History   Tobacco Use   Smoking status: Never   Smokeless tobacco: Never  Vaping Use   Vaping Use: Never used  Substance Use Topics   Alcohol use: No    Alcohol/week: 0.0 standard drinks   Drug use: No    Review of Systems   Review of Systems  Constitutional:  Negative for  chills and fever.  Respiratory:  Negative for shortness of breath.   Cardiovascular:  Negative for chest pain.  Gastrointestinal:  Positive for diarrhea and nausea. Negative for abdominal pain and vomiting.  Genitourinary:  Negative for difficulty urinating and dysuria.  Musculoskeletal:  Positive for arthralgias and myalgias.  Neurological:  Negative for weakness and numbness.  All other systems reviewed and are negative.  Physical Exam Updated Vital Signs BP (!) 136/56   Pulse 71   Temp 97.8 F (36.6 C) (Oral)   Resp 19   Ht '5\' 4"'$  (1.626 m)   Wt 79.4 kg   SpO2 100%   BMI 30.04 kg/m   Physical Exam Vitals and nursing note reviewed.  Constitutional:  General: She is not in acute distress.    Appearance: Normal appearance.  HENT:     Head: Normocephalic and atraumatic.     Mouth/Throat:     Mouth: Mucous membranes are dry.  Eyes:     General: No scleral icterus.    Conjunctiva/sclera: Conjunctivae normal.  Pulmonary:     Effort: Pulmonary effort is normal. No respiratory distress.     Breath sounds: No stridor.  Abdominal:     General: There is no distension.     Palpations: Abdomen is soft.     Tenderness: There is no abdominal tenderness. There is no guarding.  Musculoskeletal:        General: No deformity or signs of injury.     Cervical back: Normal range of motion.     Comments: Pain with palpation over the right SI joint, no midline tenderness Patient is able to range the bilateral hips 5 out of 5 strength with plantar flexion dorsiflexion bilateral lower extremities Incision is grossly intact in the bilateral lower extremities  Skin:    General: Skin is dry.     Coloration: Skin is not jaundiced or pale.  Neurological:     General: No focal deficit present.     Mental Status: She is alert and oriented to person, place, and time. Mental status is at baseline.  Psychiatric:        Mood and Affect: Mood normal.        Behavior: Behavior normal.      LABS (all labs ordered are listed, but only abnormal results are displayed)  Labs Reviewed  COMPREHENSIVE METABOLIC PANEL - Abnormal; Notable for the following components:      Result Value   Sodium 134 (*)    Potassium 3.1 (*)    Glucose, Bld 260 (*)    Creatinine, Ser 1.03 (*)    Albumin 3.2 (*)    GFR, Estimated 55 (*)    All other components within normal limits  CBC - Abnormal; Notable for the following components:   RBC 3.64 (*)    HCT 34.2 (*)    All other components within normal limits  URINALYSIS, COMPLETE (UACMP) WITH MICROSCOPIC - Abnormal; Notable for the following components:   Color, Urine YELLOW (*)    APPearance HAZY (*)    Glucose, UA 50 (*)    Ketones, ur 5 (*)    Protein, ur 100 (*)    All other components within normal limits  CBG MONITORING, ED - Abnormal; Notable for the following components:   Glucose-Capillary 198 (*)    All other components within normal limits  GASTROINTESTINAL PANEL BY PCR, STOOL (REPLACES STOOL CULTURE)  C DIFFICILE QUICK SCREEN W PCR REFLEX    LIPASE, BLOOD  MAGNESIUM   ____________________________________________  EKG  N/a ____________________________________________  RADIOLOGY Almeta Monas, personally viewed and evaluated these images (plain radiographs) as part of my medical decision making, as well as reviewing the written report by the radiologist.  ED MD interpretation: Reviewed the x-ray of the pelvis and hip which did not show any acute fracture    ____________________________________________   PROCEDURES  Procedure(s) performed (including Critical Care):  Procedures   ____________________________________________   INITIAL IMPRESSION / ASSESSMENT AND PLAN / ED COURSE     80 year old female presents with chief complaint of diarrhea.  This has been going on for several months and she has been following with her primary care provider for this.  She has no associate abdominal pain  fevers  or chills.  Her abdomen is benign.  Vital signs within normal limits.  She does appear somewhat dry on exam but her labs are reassuring, normal urine specific gravity, no AKI.  Potassium slightly low at 3.1 which we will replete.  Do not feel that she needs abdominal imaging at this time.  I had wanted to send a C. difficile and viral stool studies but the patient was not able to produce a sample while in the emergency department.  I will refer patient to GI for further evaluation.  Patient has a secondary complaint of low back/hip pain after a fall several weeks ago.  She has been ambulating on it but has ongoing discomfort.  She has no midline tenderness today is able to range the joint and is neurovascular intact.  I obtained an x-ray of the pelvis and hip which do not show acute fracture.    Patient was about to be discharged however her son notes that she has been having difficulty ambulating since the fall.  Patient reiterates that she has been unable to sleep due to pain and he thinks that the pain is making her diarrhea worse.  Given there was trauma with worsening pain will obtain a CT of the pelvis.  Will treat with Dilaudid.  Patient feeling better after dose of IV opiates.  CT of the pelvis is negative for acute fracture.  Patient will be discharged.  Referred to both GI and orthopedics.  Clinical Course as of 10/25/20 0739  Fri Oct 25, 2020  0249 Magnesium: 1.8 [KM]    Clinical Course User Index [KM] Rada Hay, MD     ____________________________________________   FINAL CLINICAL IMPRESSION(S) / ED DIAGNOSES  Final diagnoses:  Fall  Diarrhea, unspecified type  Hypokalemia     ED Discharge Orders          Ordered    potassium chloride SA (KLOR-CON) 20 MEQ tablet  2 times daily        10/25/20 0351             Note:  This document was prepared using Dragon voice recognition software and may include unintentional dictation errors.    Rada Hay,  MD 10/25/20 [id number]    Rada Hay, MD 10/25/20 [phone]

## 2020-10-25 NOTE — Discharge Instructions (Addendum)
Your blood work was reassuring today.  Your potassium was on the low side, please take the potassium supplementation for the next 5 days.  You should follow-up with your primary care provider for a repeat blood check in about a week.  Please also follow-up with a gastroenterologist for management of your chronic diarrhea.

## 2020-10-26 ENCOUNTER — Emergency Department: Payer: Medicare PPO

## 2020-10-26 ENCOUNTER — Encounter: Payer: Self-pay | Admitting: Emergency Medicine

## 2020-10-26 ENCOUNTER — Emergency Department
Admission: EM | Admit: 2020-10-26 | Discharge: 2020-10-27 | Disposition: A | Discharge status: Home / Self Care | Payer: Medicare PPO | Source: Home / Self Care | Attending: Student in an Organized Health Care Education/Training Program | Admitting: Student in an Organized Health Care Education/Training Program

## 2020-10-26 ENCOUNTER — Other Ambulatory Visit: Payer: Self-pay

## 2020-10-26 DIAGNOSIS — Z043 Encounter for examination and observation following other accident: Secondary | ICD-10-CM | POA: Diagnosis not present

## 2020-10-26 DIAGNOSIS — I6501 Occlusion and stenosis of right vertebral artery: Secondary | ICD-10-CM | POA: Diagnosis not present

## 2020-10-26 DIAGNOSIS — M1990 Unspecified osteoarthritis, unspecified site: Secondary | ICD-10-CM | POA: Diagnosis present

## 2020-10-26 DIAGNOSIS — R531 Weakness: Secondary | ICD-10-CM | POA: Insufficient documentation

## 2020-10-26 DIAGNOSIS — R279 Unspecified lack of coordination: Secondary | ICD-10-CM | POA: Diagnosis not present

## 2020-10-26 DIAGNOSIS — E1140 Type 2 diabetes mellitus with diabetic neuropathy, unspecified: Secondary | ICD-10-CM | POA: Diagnosis present

## 2020-10-26 DIAGNOSIS — K5732 Diverticulitis of large intestine without perforation or abscess without bleeding: Secondary | ICD-10-CM | POA: Diagnosis present

## 2020-10-26 DIAGNOSIS — R197 Diarrhea, unspecified: Secondary | ICD-10-CM | POA: Diagnosis not present

## 2020-10-26 DIAGNOSIS — M797 Fibromyalgia: Secondary | ICD-10-CM | POA: Diagnosis present

## 2020-10-26 DIAGNOSIS — S12000D Unspecified displaced fracture of first cervical vertebra, subsequent encounter for fracture with routine healing: Secondary | ICD-10-CM | POA: Diagnosis not present

## 2020-10-26 DIAGNOSIS — F32A Depression, unspecified: Secondary | ICD-10-CM | POA: Diagnosis not present

## 2020-10-26 DIAGNOSIS — N1832 Chronic kidney disease, stage 3b: Secondary | ICD-10-CM | POA: Insufficient documentation

## 2020-10-26 DIAGNOSIS — S0990XA Unspecified injury of head, initial encounter: Secondary | ICD-10-CM | POA: Diagnosis not present

## 2020-10-26 DIAGNOSIS — R2681 Unsteadiness on feet: Secondary | ICD-10-CM | POA: Diagnosis not present

## 2020-10-26 DIAGNOSIS — Z801 Family history of malignant neoplasm of trachea, bronchus and lung: Secondary | ICD-10-CM | POA: Diagnosis not present

## 2020-10-26 DIAGNOSIS — W19XXXA Unspecified fall, initial encounter: Secondary | ICD-10-CM | POA: Diagnosis not present

## 2020-10-26 DIAGNOSIS — N179 Acute kidney failure, unspecified: Secondary | ICD-10-CM | POA: Diagnosis not present

## 2020-10-26 DIAGNOSIS — Z743 Need for continuous supervision: Secondary | ICD-10-CM | POA: Diagnosis not present

## 2020-10-26 DIAGNOSIS — E1122 Type 2 diabetes mellitus with diabetic chronic kidney disease: Secondary | ICD-10-CM | POA: Diagnosis present

## 2020-10-26 DIAGNOSIS — K5792 Diverticulitis of intestine, part unspecified, without perforation or abscess without bleeding: Secondary | ICD-10-CM | POA: Insufficient documentation

## 2020-10-26 DIAGNOSIS — G4489 Other headache syndrome: Secondary | ICD-10-CM | POA: Diagnosis not present

## 2020-10-26 DIAGNOSIS — K219 Gastro-esophageal reflux disease without esophagitis: Secondary | ICD-10-CM | POA: Diagnosis present

## 2020-10-26 DIAGNOSIS — I6523 Occlusion and stenosis of bilateral carotid arteries: Secondary | ICD-10-CM | POA: Diagnosis not present

## 2020-10-26 DIAGNOSIS — S12040A Displaced lateral mass fracture of first cervical vertebra, initial encounter for closed fracture: Secondary | ICD-10-CM | POA: Diagnosis not present

## 2020-10-26 DIAGNOSIS — Z79899 Other long term (current) drug therapy: Secondary | ICD-10-CM | POA: Insufficient documentation

## 2020-10-26 DIAGNOSIS — R410 Disorientation, unspecified: Secondary | ICD-10-CM | POA: Diagnosis not present

## 2020-10-26 DIAGNOSIS — R110 Nausea: Secondary | ICD-10-CM | POA: Insufficient documentation

## 2020-10-26 DIAGNOSIS — M5459 Other low back pain: Secondary | ICD-10-CM | POA: Diagnosis not present

## 2020-10-26 DIAGNOSIS — I129 Hypertensive chronic kidney disease with stage 1 through stage 4 chronic kidney disease, or unspecified chronic kidney disease: Secondary | ICD-10-CM | POA: Insufficient documentation

## 2020-10-26 DIAGNOSIS — Z8249 Family history of ischemic heart disease and other diseases of the circulatory system: Secondary | ICD-10-CM | POA: Diagnosis not present

## 2020-10-26 DIAGNOSIS — J45909 Unspecified asthma, uncomplicated: Secondary | ICD-10-CM | POA: Insufficient documentation

## 2020-10-26 DIAGNOSIS — M546 Pain in thoracic spine: Secondary | ICD-10-CM | POA: Diagnosis not present

## 2020-10-26 DIAGNOSIS — Z20822 Contact with and (suspected) exposure to covid-19: Secondary | ICD-10-CM | POA: Diagnosis present

## 2020-10-26 DIAGNOSIS — G936 Cerebral edema: Secondary | ICD-10-CM | POA: Diagnosis present

## 2020-10-26 DIAGNOSIS — M545 Low back pain, unspecified: Secondary | ICD-10-CM

## 2020-10-26 DIAGNOSIS — Z8051 Family history of malignant neoplasm of kidney: Secondary | ICD-10-CM | POA: Diagnosis not present

## 2020-10-26 DIAGNOSIS — R109 Unspecified abdominal pain: Secondary | ICD-10-CM | POA: Diagnosis present

## 2020-10-26 DIAGNOSIS — N182 Chronic kidney disease, stage 2 (mild): Secondary | ICD-10-CM | POA: Diagnosis not present

## 2020-10-26 DIAGNOSIS — I672 Cerebral atherosclerosis: Secondary | ICD-10-CM | POA: Diagnosis not present

## 2020-10-26 DIAGNOSIS — Z741 Need for assistance with personal care: Secondary | ICD-10-CM | POA: Diagnosis not present

## 2020-10-26 DIAGNOSIS — M87812 Other osteonecrosis, left shoulder: Secondary | ICD-10-CM | POA: Diagnosis not present

## 2020-10-26 DIAGNOSIS — K5730 Diverticulosis of large intestine without perforation or abscess without bleeding: Secondary | ICD-10-CM | POA: Diagnosis not present

## 2020-10-26 DIAGNOSIS — D509 Iron deficiency anemia, unspecified: Secondary | ICD-10-CM | POA: Diagnosis present

## 2020-10-26 DIAGNOSIS — R0902 Hypoxemia: Secondary | ICD-10-CM | POA: Diagnosis not present

## 2020-10-26 DIAGNOSIS — S0101XA Laceration without foreign body of scalp, initial encounter: Secondary | ICD-10-CM | POA: Diagnosis not present

## 2020-10-26 DIAGNOSIS — Z7982 Long term (current) use of aspirin: Secondary | ICD-10-CM | POA: Insufficient documentation

## 2020-10-26 DIAGNOSIS — I6389 Other cerebral infarction: Secondary | ICD-10-CM | POA: Diagnosis not present

## 2020-10-26 DIAGNOSIS — E876 Hypokalemia: Secondary | ICD-10-CM | POA: Diagnosis not present

## 2020-10-26 DIAGNOSIS — G9389 Other specified disorders of brain: Secondary | ICD-10-CM | POA: Diagnosis not present

## 2020-10-26 DIAGNOSIS — Z7984 Long term (current) use of oral hypoglycemic drugs: Secondary | ICD-10-CM | POA: Insufficient documentation

## 2020-10-26 DIAGNOSIS — S12041A Nondisplaced lateral mass fracture of first cervical vertebra, initial encounter for closed fracture: Secondary | ICD-10-CM | POA: Diagnosis present

## 2020-10-26 DIAGNOSIS — R1110 Vomiting, unspecified: Secondary | ICD-10-CM | POA: Diagnosis not present

## 2020-10-26 DIAGNOSIS — E041 Nontoxic single thyroid nodule: Secondary | ICD-10-CM | POA: Diagnosis not present

## 2020-10-26 DIAGNOSIS — Z833 Family history of diabetes mellitus: Secondary | ICD-10-CM | POA: Diagnosis not present

## 2020-10-26 DIAGNOSIS — M542 Cervicalgia: Secondary | ICD-10-CM | POA: Diagnosis not present

## 2020-10-26 DIAGNOSIS — E785 Hyperlipidemia, unspecified: Secondary | ICD-10-CM | POA: Diagnosis present

## 2020-10-26 DIAGNOSIS — K5900 Constipation, unspecified: Secondary | ICD-10-CM | POA: Diagnosis not present

## 2020-10-26 DIAGNOSIS — S12001S Unspecified nondisplaced fracture of first cervical vertebra, sequela: Secondary | ICD-10-CM | POA: Diagnosis not present

## 2020-10-26 DIAGNOSIS — Z8616 Personal history of COVID-19: Secondary | ICD-10-CM | POA: Diagnosis not present

## 2020-10-26 DIAGNOSIS — Z8673 Personal history of transient ischemic attack (TIA), and cerebral infarction without residual deficits: Secondary | ICD-10-CM | POA: Diagnosis not present

## 2020-10-26 DIAGNOSIS — I639 Cerebral infarction, unspecified: Secondary | ICD-10-CM | POA: Diagnosis not present

## 2020-10-26 DIAGNOSIS — I618 Other nontraumatic intracerebral hemorrhage: Secondary | ICD-10-CM | POA: Diagnosis present

## 2020-10-26 DIAGNOSIS — N3001 Acute cystitis with hematuria: Secondary | ICD-10-CM | POA: Diagnosis not present

## 2020-10-26 DIAGNOSIS — S12001A Unspecified nondisplaced fracture of first cervical vertebra, initial encounter for closed fracture: Secondary | ICD-10-CM | POA: Diagnosis not present

## 2020-10-26 DIAGNOSIS — B9620 Unspecified Escherichia coli [E. coli] as the cause of diseases classified elsewhere: Secondary | ICD-10-CM | POA: Diagnosis present

## 2020-10-26 DIAGNOSIS — I10 Essential (primary) hypertension: Secondary | ICD-10-CM | POA: Diagnosis not present

## 2020-10-26 DIAGNOSIS — I1310 Hypertensive heart and chronic kidney disease without heart failure, with stage 1 through stage 4 chronic kidney disease, or unspecified chronic kidney disease: Secondary | ICD-10-CM | POA: Diagnosis present

## 2020-10-26 DIAGNOSIS — W050XXA Fall from non-moving wheelchair, initial encounter: Secondary | ICD-10-CM | POA: Diagnosis present

## 2020-10-26 DIAGNOSIS — S12091S Other nondisplaced fracture of first cervical vertebra, sequela: Secondary | ICD-10-CM | POA: Diagnosis not present

## 2020-10-26 DIAGNOSIS — R079 Chest pain, unspecified: Secondary | ICD-10-CM | POA: Diagnosis not present

## 2020-10-26 DIAGNOSIS — E860 Dehydration: Secondary | ICD-10-CM | POA: Diagnosis not present

## 2020-10-26 DIAGNOSIS — I517 Cardiomegaly: Secondary | ICD-10-CM | POA: Diagnosis not present

## 2020-10-26 DIAGNOSIS — M6281 Muscle weakness (generalized): Secondary | ICD-10-CM | POA: Diagnosis not present

## 2020-10-26 DIAGNOSIS — U071 COVID-19: Secondary | ICD-10-CM | POA: Diagnosis not present

## 2020-10-26 DIAGNOSIS — N390 Urinary tract infection, site not specified: Secondary | ICD-10-CM | POA: Diagnosis present

## 2020-10-26 DIAGNOSIS — I619 Nontraumatic intracerebral hemorrhage, unspecified: Secondary | ICD-10-CM | POA: Diagnosis not present

## 2020-10-26 DIAGNOSIS — G319 Degenerative disease of nervous system, unspecified: Secondary | ICD-10-CM | POA: Diagnosis not present

## 2020-10-26 DIAGNOSIS — D696 Thrombocytopenia, unspecified: Secondary | ICD-10-CM | POA: Diagnosis not present

## 2020-10-26 LAB — BASIC METABOLIC PANEL
Anion gap: 13 (ref 5–15)
BUN: 14 mg/dL (ref 8–23)
CO2: 23 mmol/L (ref 22–32)
Calcium: 9 mg/dL (ref 8.9–10.3)
Chloride: 100 mmol/L (ref 98–111)
Creatinine, Ser: 1.15 mg/dL — ABNORMAL HIGH (ref 0.44–1.00)
GFR, Estimated: 48 mL/min — ABNORMAL LOW (ref >60)
Glucose, Bld: 243 mg/dL — ABNORMAL HIGH (ref 70–99)
Potassium: 3.5 mmol/L (ref 3.5–5.1)
Sodium: 136 mmol/L (ref 135–145)

## 2020-10-26 LAB — CBC
HCT: 34.5 % — ABNORMAL LOW (ref 36.0–46.0)
Hemoglobin: 11.9 g/dL — ABNORMAL LOW (ref 12.0–15.0)
MCH: 32.7 pg (ref 26.0–34.0)
MCHC: 34.5 g/dL (ref 30.0–36.0)
MCV: 94.8 fL (ref 80.0–100.0)
Platelets: 244 10*3/uL (ref 150–400)
RBC: 3.64 MIL/uL — ABNORMAL LOW (ref 3.87–5.11)
RDW: 13.6 % (ref 11.5–15.5)
WBC: 7.8 10*3/uL (ref 4.0–10.5)
nRBC: 0 % (ref 0.0–0.2)

## 2020-10-26 LAB — HEPATIC FUNCTION PANEL
ALT: 26 U/L (ref 0–44)
AST: 35 U/L (ref 15–41)
Albumin: 3.3 g/dL — ABNORMAL LOW (ref 3.5–5.0)
Alkaline Phosphatase: 73 U/L (ref 38–126)
Bilirubin, Direct: 0.4 mg/dL — ABNORMAL HIGH (ref 0.0–0.2)
Indirect Bilirubin: 1.3 mg/dL — ABNORMAL HIGH (ref 0.3–0.9)
Total Bilirubin: 1.7 mg/dL — ABNORMAL HIGH (ref 0.3–1.2)
Total Protein: 7.3 g/dL (ref 6.5–8.1)

## 2020-10-26 LAB — TROPONIN I (HIGH SENSITIVITY): Troponin I (High Sensitivity): 18 ng/L — ABNORMAL HIGH (ref <18)

## 2020-10-26 LAB — MAGNESIUM: Magnesium: 1.8 mg/dL (ref 1.7–2.4)

## 2020-10-26 LAB — CBG MONITORING, ED: Glucose-Capillary: 236 mg/dL — ABNORMAL HIGH (ref 70–99)

## 2020-10-26 MED ORDER — LIDOCAINE 5 % EX PTCH
1.0000 | MEDICATED_PATCH | CUTANEOUS | Status: DC
  Administered 2020-10-27: 1 via TRANSDERMAL
  Filled 2020-10-26: qty 1

## 2020-10-26 MED ORDER — LIDOCAINE 5 % EX PTCH: 1.0000 | MEDICATED_PATCH | Freq: Two times a day (BID) | CUTANEOUS | 0 refills | Status: DC

## 2020-10-26 MED ORDER — IOHEXOL 350 MG/ML SOLN
75.0000 mL | Freq: Once | INTRAVENOUS | Status: AC | PRN
  Administered 2020-10-26: 75 mL via INTRAVENOUS

## 2020-10-26 MED ORDER — SODIUM CHLORIDE 0.9 % IV SOLN: Freq: Once | INTRAVENOUS | Status: AC

## 2020-10-26 MED ORDER — HYDROCODONE-ACETAMINOPHEN 5-325 MG PO TABS: 1.0000 | ORAL_TABLET | Freq: Every day | ORAL | 0 refills | Status: DC

## 2020-10-26 MED ORDER — OXYCODONE-ACETAMINOPHEN 5-325 MG PO TABS
1.0000 | ORAL_TABLET | Freq: Once | ORAL | Status: AC
  Administered 2020-10-26: 1 via ORAL
  Filled 2020-10-26: qty 1

## 2020-10-26 NOTE — ED Provider Notes (Signed)
[facility] Emergency Department Provider Note    Event Date/Time   First MD Initiated Contact with Patient 10/26/20 1802     (approximate)  I have reviewed the triage vital signs and the nursing notes.   HISTORY  Chief Complaint Weakness    HPI [name] is a 80 y.o. female below listed past medical history presents to the ER for evaluation of left-sided crampy abdominal pain associated with diarrhea nausea poor p.o. intake and feeling generalized weakness.  Was just seen for similar symptoms and sent.  Has not felt she is getting any better.  Has not taken any Imodium.  Has not filled the antiemetic medication that was prescribed.  Denies any chest pain or shortness of breath.  Symptoms have been progressively worsening over the past several months since she was sick with COVID.  Past Medical History:  Diagnosis Date   Allergic rhinitis    Anal fissure    Arthritis    Asthma    Depression    Diabetes mellitus    type 2   Diverticulosis    Fibromyalgia    GERD (gastroesophageal reflux disease)    Hypertension    Obesity    Osteoarthritis    Family History  Problem Relation Age of Onset   Heart attack Father    Coronary artery disease Father    Kidney cancer Mother    Lung cancer Brother    Liver cancer Brother    Diabetes Other        Maternal side   Brain cancer Daughter    Hypertension Neg Hx    Breast cancer Neg Hx    Colon cancer Neg Hx    Past Surgical History:  Procedure Laterality Date   ABDOMINAL HYSTERECTOMY  1970's   CATARACT EXTRACTION, BILATERAL     CHOLECYSTECTOMY  4/06   TUBAL LIGATION  1970's   VENTRAL HERNIA REPAIR  9/08   [place]   Patient Active Problem List   Diagnosis Date Noted   Diarrhea 10/16/2020   Malnutrition of mild degree ([place]) 10/16/2020   Stage 3b chronic kidney disease ([place]) 12/25/2016   Right hand pain 07/12/2015   Advance directive discussed with patient 07/12/2015   Iron deficiency  anemia 07/18/2014   Type 2 diabetes, uncontrolled, with neuropathy ([place]) 02/22/2012   Routine general medical examination at a health care facility 08/25/2011   Osteoarthritis, knee 08/25/2011   Fibromyalgia 02/25/2011   Obesity 02/25/2011   UTERINE PROLAPSE 02/25/2010   GERD 02/13/2009   CARCINOMA, BASAL CELL 05/18/2008   ALLERGIC RHINITIS 05/18/2008   Episodic mood disorder ([place]) 06/20/2007   Essential hypertension, benign 10/27/2006      Prior to Admission medications   Medication Sig Start Date End Date Taking? Authorizing Provider  HYDROcodone-acetaminophen (NORCO) 5-325 MG tablet Take 1 tablet by mouth at bedtime. 10/26/20  Yes Merlyn Lot, MD  lidocaine (LIDODERM) 5 % Place 1 patch onto the skin every 12 (twelve) hours. Remove & Discard patch within 12 hours or as directed by MD 10/26/20 10/26/21 Yes Merlyn Lot, MD  Accu-Chek Softclix Lancets lancets Use to obtain blood sugar sample Dx Code E11.40 10/16/20   Venia Carbon, MD  aspirin 81 MG tablet Take 81 mg by mouth daily. Patient not taking: Reported on 10/16/2020    [provider]  Cyanocobalamin (VITAMIN B12 PO) Take by mouth. Patient not taking: Reported on 10/16/2020    [provider]  doxazosin (CARDURA) 4 MG tablet TAKE 1/2  TABLET TWICE DAILY Patient not taking: Reported on 10/16/2020 12/12/19   [name] I, MD  FeFum-FePoly-FA-B Cmp-C-Biot (INTEGRA PLUS) CAPS Take 1 capsule by mouth every morning. Patient not taking: Reported on 10/16/2020 09/19/14   Curt Bears, MD  gabapentin (NEURONTIN) 100 MG capsule TAKE 1 CAPSULE TWICE DAILY Patient not taking: Reported on 10/16/2020 12/12/19   Venia Carbon, MD  glipiZIDE (GLUCOTROL XL) 10 MG 24 hr tablet TAKE 1 TABLET TWICE DAILY Patient not taking: Reported on 10/16/2020 01/18/20   Venia Carbon, MD  Glucosamine-Chondroitin (COSAMIN DS PO) Take by mouth. Patient not taking: Reported on 10/16/2020    [provider]  glucose blood  (ACCU-CHEK GUIDE) test strip Use to check blood sugar once daily Dx Code E11.40 10/16/20   Venia Carbon, MD  hyoscyamine (LEVSIN) 0.125 MG tablet Take 1 tablet (0.125 mg total) by mouth 3 (three) times daily as needed. Before meals. Don't use imodium also 10/16/20   Venia Carbon, MD  losartan-hydrochlorothiazide [facility]) 100-25 MG tablet TAKE 1 TABLET EVERY DAY Patient not taking: Reported on 10/16/2020 05/23/20   Venia Carbon, MD  metFORMIN (GLUCOPHAGE-XR) 750 MG 24 hr tablet TAKE 2 TABLETS EVERY DAY WITH BREAKFAST Patient not taking: Reported on 10/16/2020 01/18/20   Venia Carbon, MD  Multiple Vitamin (MULTIVITAMIN) tablet Take 1 tablet by mouth daily. Patient not taking: Reported on 10/16/2020    [provider]  ondansetron (ZOFRAN ODT) 4 MG disintegrating tablet Take 1 tablet (4 mg total) by mouth every 8 (eight) hours as needed for nausea or vomiting. 10/16/20   Venia Carbon, MD  OVER THE COUNTER MEDICATION Knock Out All Natural Sleep Aid Patient not taking: Reported on 10/16/2020    [provider]  potassium chloride SA (KLOR-CON) 20 MEQ tablet Take 1 tablet (20 mEq total) by mouth 2 (two) times daily for 5 days. 10/25/20 10/30/20  Lavonia Drafts, MD  tiZANidine (ZANAFLEX) 4 MG tablet TAKE 1 TABLET AT BEDTIME Patient not taking: Reported on 10/16/2020 06/05/20   Venia Carbon, MD  venlafaxine [facility]) 37.5 MG tablet TAKE 1 TABLET TWICE DAILY Patient not taking: Reported on 10/16/2020 12/12/19   Venia Carbon, MD    Allergies Diphenhydramine hcl, Fluoxetine hcl, Lisinopril, and Sertraline hcl    Social History Social History   Tobacco Use   Smoking status: Never   Smokeless tobacco: Never  Vaping Use   Vaping Use: Never used  Substance Use Topics   Alcohol use: No    Alcohol/week: 0.0 standard drinks   Drug use: No    Review of Systems Patient denies headaches, rhinorrhea, blurry vision, numbness, shortness of breath, chest pain, edema, cough,  abdominal pain, nausea, vomiting, diarrhea, dysuria, fevers, rashes or hallucinations unless otherwise stated above in HPI. ____________________________________________   PHYSICAL EXAM:  VITAL SIGNS: Vitals:   10/26/20 1805 10/26/20 2045  BP: (!) 183/88 (!) 178/78  Pulse: 83 88  Resp: 16 17  Temp:    SpO2: 99% 97%    Constitutional: Alert and oriented.  Eyes: Conjunctivae are normal.  Head: Atraumatic. Nose: No congestion/rhinnorhea. Mouth/Throat: Mucous membranes are moist.   Neck: No stridor. Painless ROM.  Cardiovascular: Normal rate, regular rhythm. Grossly normal heart sounds.  Good peripheral circulation. Respiratory: Normal respiratory effort.  No retractions. Lungs CTAB. Gastrointestinal: Soft with mild ttp in llq. No distention. No abdominal bruits. No CVA tenderness. Genitourinary:  Musculoskeletal: No lower extremity tenderness nor edema.  No joint effusions. Neurologic:  Normal speech  and language. No gross focal neurologic deficits are appreciated. No facial droop Skin:  Skin is warm, dry and intact. No rash noted. Psychiatric: Mood and affect are normal. Speech and behavior are normal.  ____________________________________________   LABS (all labs ordered are listed, but only abnormal results are displayed)  Results for orders placed or performed during the hospital encounter of 10/26/20 (from the past 24 hour(s))  Basic metabolic panel     Status: Abnormal   Collection Time: 10/26/20  4:12 PM  Result Value Ref Range   Sodium 136 135 - 145 mmol/L   Potassium 3.5 3.5 - 5.1 mmol/L   Chloride 100 98 - 111 mmol/L   CO2 23 22 - 32 mmol/L   Glucose, Bld 243 (H) 70 - 99 mg/dL   BUN 14 8 - 23 mg/dL   Creatinine, Ser 1.15 (H) 0.44 - 1.00 mg/dL   Calcium 9.0 8.9 - 10.3 mg/dL   GFR, Estimated 48 (L) >60 mL/min   Anion gap 13 5 - 15  CBC     Status: Abnormal   Collection Time: 10/26/20  4:12 PM  Result Value Ref Range   WBC 7.8 4.0 - 10.5 K/uL   RBC 3.64 (L)  3.87 - 5.11 MIL/uL   Hemoglobin 11.9 (L) 12.0 - 15.0 g/dL   HCT 34.5 (L) 36.0 - 46.0 %   MCV 94.8 80.0 - 100.0 fL   MCH 32.7 26.0 - 34.0 pg   MCHC 34.5 30.0 - 36.0 g/dL   RDW 13.6 11.5 - 15.5 %   Platelets 244 150 - 400 K/uL   nRBC 0.0 0.0 - 0.2 %  Troponin I (High Sensitivity)     Status: Abnormal   Collection Time: 10/26/20  4:21 PM  Result Value Ref Range   Troponin I (High Sensitivity) 18 (H) <18 ng/L  Hepatic function panel     Status: Abnormal   Collection Time: 10/26/20  4:21 PM  Result Value Ref Range   Total Protein 7.3 6.5 - 8.1 g/dL   Albumin 3.3 (L) 3.5 - 5.0 g/dL   AST 35 15 - 41 U/L   ALT 26 0 - 44 U/L   Alkaline Phosphatase 73 38 - 126 U/L   Total Bilirubin 1.7 (H) 0.3 - 1.2 mg/dL   Bilirubin, Direct 0.4 (H) 0.0 - 0.2 mg/dL   Indirect Bilirubin 1.3 (H) 0.3 - 0.9 mg/dL  CBG monitoring, ED     Status: Abnormal   Collection Time: 10/26/20  6:12 PM  Result Value Ref Range   Glucose-Capillary 236 (H) 70 - 99 mg/dL  Magnesium     Status: None   Collection Time: 10/26/20  7:12 PM  Result Value Ref Range   Magnesium 1.8 1.7 - 2.4 mg/dL   ____________________________________________  EKG My review and personal interpretation at Time: 16:15   Indication: weakness  Rate: 80  Rhythm: sinus Axis: left Other: pooor r wave progression, nos temi no depressions ____________________________________________  RADIOLOGY  I personally reviewed all radiographic images ordered to evaluate for the above acute complaints and reviewed radiology reports and findings.  These findings were personally discussed with the patient.  Please see medical record for radiology report.  ____________________________________________   PROCEDURES  Procedure(s) performed:  Procedures    Critical Care performed: no ____________________________________________   INITIAL IMPRESSION / ASSESSMENT AND PLAN / ED COURSE  Pertinent labs & imaging results that were available during my care of  the patient were reviewed by me and considered in my medical  decision making (see chart for details).   DDX: Dehydration, electrolyte abnormality, colitis, diverticulitis, food allergy, long COVID, fracture, contusion, radiculopathy, spinal stenosis, UTI  [name] is a 80 y.o. who presents to the ED with presentation as described above.  Patient in no acute distress afebrile hemodynamically stable with symptoms as described above and ongoing for several weeks but reportedly getting worse.  Blood work sent for above differential.  CT imaging will be ordered.  Clinical Course as of 10/26/20 2205  Sat Oct 26, 2020  2105 Additional conversation with family regarding patient's work-up possible diverticulitis.  They are very reluctant to start any new antibiotics as they feel that much of this was related to when she was putting on amoxicillin several weeks ago.  The symptoms seem to have been ongoing for several weeks even months.  Also complaining of back pain and having trouble sleeping at night and son is concerned that stress and may be some anxiety is contributing to the presentation.  Discussed work-up thus far have encouraged him to provide stool sample which she thinks that she can. [PR]  2201 Patient states that when she is up ambulating she feels like she is having increasing weakness in her legs denies any loss control bowel bladder.  Have recommended antibiotics regarding CT imaging for diverticulitis but patient feels like this has been ongoing for quite some time and is reluctant to take any antibiotics she is not able to provide any additional stool therefore have a lower suspicion for C. difficile.  Think that holding off on antibiotics at this point is reasonable given he illness  history and duration.  She is complaining of low back pain and said that she having difficulty walking.  Does not have any objective deficits will order MRI.  If MRI negative patient is stable and appropriate  for further work-up as an outpatient. [PR]    Clinical Course User Index [PR] Merlyn Lot, MD    The patient was evaluated in Emergency Department today for the symptoms described in the history of present illness. He/she was evaluated in the context of the global COVID-19 pandemic, which necessitated consideration that the patient might be at risk for infection with the SARS-CoV-2 virus that causes COVID-19. Institutional protocols and algorithms that pertain to the evaluation of patients at risk for COVID-19 are in a state of rapid change based on information released by regulatory bodies including the CDC and federal and state organizations. These policies and algorithms were followed during the patient's care in the ED.  As part of my medical decision making, I reviewed the following data within the [place] notes reviewed and incorporated, Labs reviewed, notes from prior ED visits and [place] Controlled Substance Database   ____________________________________________   FINAL CLINICAL IMPRESSION(S) / ED DIAGNOSES  Final diagnoses:  Midline low back pain, unspecified chronicity, unspecified whether sciatica present  Diarrhea, unspecified type      NEW MEDICATIONS STARTED DURING THIS VISIT:  New Prescriptions   HYDROCODONE-ACETAMINOPHEN (NORCO) 5-325 MG TABLET    Take 1 tablet by mouth at bedtime.   LIDOCAINE (LIDODERM) 5 %    Place 1 patch onto the skin every 12 (twelve) hours. Remove & Discard patch within 12 hours or as directed by MD     Note:  This document was prepared using Dragon voice recognition software and may include unintentional dictation errors.    Merlyn Lot, MD 10/26/20 2205

## 2020-10-26 NOTE — ED Notes (Signed)
Pt gone to MRI 

## 2020-10-26 NOTE — ED Notes (Signed)
Unable to obtain stool sample at this time. Attempted twice with bed pan and assisting patient.  Brief changed. No stool noted.

## 2020-10-26 NOTE — ED Triage Notes (Signed)
Pt via EMS from home. Pt c/o weakness, diarrhea, and emesis for over 2 months. States she was seen and discharged on Thursday. Pt states she is now weaker than normal also c/o lower back pain. Pt is A&Ox4 and NAD.

## 2020-10-26 NOTE — ED Triage Notes (Signed)
First nurse note: pt comes ems from home for diarrhea for two months since having covid. Was ambulatory on scene but is now stating that she cannot walk. CBG 245. 148/110. C/o stomach cramping.

## 2020-10-26 NOTE — ED Provider Notes (Signed)
Physical Exam  BP (!) 152/75   Pulse 80   Temp 98.1 F (36.7 C) (Oral)   Resp 15   Ht '5\' 4"'$  (1.626 m)   Wt 79.4 kg   SpO2 96%   BMI 30.04 kg/m   Physical Exam  ED Course/Procedures   Clinical Course as of 10/27/20 0109  Sat Oct 26, 2020  2105 Additional conversation with family regarding patient's work-up possible diverticulitis.  They are very reluctant to start any new antibiotics as they feel that much of this was related to when she was putting on amoxicillin several weeks ago.  The symptoms seem to have been ongoing for several weeks even months.  Also complaining of back pain and having trouble sleeping at night and son is concerned that stress and may be some anxiety is contributing to the presentation.  Discussed work-up thus far have encouraged him to provide stool sample which she thinks that she can. [PR]  2201 Patient states that when she is up ambulating she feels like she is having increasing weakness in her legs denies any loss control bowel bladder.  Have recommended antibiotics regarding CT imaging for diverticulitis but patient feels like this has been ongoing for quite some time and is reluctant to take any antibiotics she is not able to provide any additional stool therefore have a lower suspicion for C. difficile.  Think that holding off on antibiotics at this point is reasonable given he illness  history and duration.  She is complaining of low back pain and said that she having difficulty walking.  Does not have any objective deficits will order MRI.  If MRI negative patient is stable and appropriate for further work-up as an outpatient. [PR]    Clinical Course User Index [PR] Merlyn Lot, MD    Procedures  MDM  11:30 PM  Assumed care at shift change.  Patient here with generalized weakness.  Complaining of lower back pain.  MRI lumbar spine pending for further dispo.  12:50 AM  Pt's MRI of the lumbar spine shows degenerative, moderate edema within the  lower half of the L1 vertebral body which may be the source of her low back pain.  No spinal stenosis, cauda equina or other sign of surgical emergency.  Patient states that she is still feeling very weak all over.  She states that she is concerned that she will not be able to ambulate at home.  She has a walker for home.  Will attempt to ambulate here.  1:10 AM  Pt able to ambulate here using a walker without any difficulty.  States that she does not normally walk very far at home and reports that she was able to walk further here than she normally does.  I talked to her son by phone who is comfortable with her going home.  He will have her follow-up with her PCP but have also given neurosurgical outpatient follow-up.  Edema there seems likely degenerative in nature.  Will discharge with brief course of pain medication for home.  Patient lives with her son.  We again discussed the findings of diverticulitis on her CT scan.  Patient and son declined antibiotics at this time.  Low suspicion for C. difficile given she has not had any diarrhea here in the emergency department.  Discussed return precautions.  They are comfortable with plan.   At this time, I do not feel there is any life-threatening condition present. I have reviewed, interpreted and discussed all results (EKG, imaging,  lab, urine as appropriate) and exam findings with patient/family. I have reviewed nursing notes and appropriate previous records.  I feel the patient is safe to be discharged home without further emergent workup and can continue workup as an outpatient as needed. Discussed usual and customary return precautions. Patient/family verbalize understanding and are comfortable with this plan.  Outpatient follow-up has been provided as needed. All questions have been answered.      Ibrahem Volkman, Delice Bison, DO 10/27/20 0110

## 2020-10-27 MED ORDER — ONDANSETRON 4 MG PO TBDP: 4.0000 mg | ORAL_TABLET | Freq: Four times a day (QID) | ORAL | 0 refills | Status: DC | PRN

## 2020-10-27 MED ORDER — OXYCODONE-ACETAMINOPHEN 5-325 MG PO TABS: 1.0000 | ORAL_TABLET | Freq: Four times a day (QID) | ORAL | 0 refills | Status: DC | PRN

## 2020-10-27 NOTE — Discharge Instructions (Addendum)
You are being provided a prescription for opiates (also known as narcotics) for pain control.  Opiates can be addictive and should only be used when absolutely necessary for pain control when other alternatives do not work.  We recommend you only use them for the recommended amount of time and only as prescribed.  Please do not take with other sedative medications or alcohol.  Please do not drive, operate machinery, make important decisions while taking opiates.  Please note that these medications can be addictive and have high abuse potential.  Patients can become addicted to narcotics after only taking them for a few days.  Please keep these medications locked away from children, teenagers or any family members with history of substance abuse.  Narcotic pain medicine may also make you constipated.  You may use over-the-counter medications such as MiraLAX, Colace to prevent constipation.  If you become constipated you may use over-the-counter enemas as needed.  Itching and nausea are common side effects of narcotic pain medication.  If you develop uncontrolled vomiting or a rash, please stop these medications.   You did have some swelling of the first lumbar vertebra likely from degenerative changes.  Please follow-up with your primary care physician for further management.  Your CT scan also showed early signs of diverticulitis.  We have discussed the possibility of starting you on antibiotics which you have declined at this time.  If your abdominal pain, diarrhea worsens or you begin having fevers at 100.4 or higher, blood in your stool, please return to the emergency department.

## 2020-10-27 NOTE — ED Notes (Signed)
Pt ambulated to foot of bed and back to head of bed using a walker; pt states she does not walk far at home; pt assisted back in bed and EDP made aware of pt ambulation

## 2020-10-27 NOTE — ED Notes (Signed)
Pt back from MRI 

## 2020-10-29 ENCOUNTER — Emergency Department: Payer: Medicare PPO

## 2020-10-29 ENCOUNTER — Inpatient Hospital Stay
Admission: EM | Admit: 2020-10-29 | Discharge: 2020-11-07 | DRG: 551 | Disposition: A | Discharge status: Skilled Nursing Facility | Payer: Medicare PPO | Attending: Internal Medicine | Admitting: Internal Medicine

## 2020-10-29 ENCOUNTER — Other Ambulatory Visit: Payer: Self-pay

## 2020-10-29 ENCOUNTER — Encounter: Payer: Self-pay | Admitting: Emergency Medicine

## 2020-10-29 ENCOUNTER — Ambulatory Visit: Payer: Medicare PPO | Admitting: Gastroenterology

## 2020-10-29 DIAGNOSIS — K219 Gastro-esophageal reflux disease without esophagitis: Secondary | ICD-10-CM | POA: Diagnosis present

## 2020-10-29 DIAGNOSIS — J45909 Unspecified asthma, uncomplicated: Secondary | ICD-10-CM | POA: Diagnosis present

## 2020-10-29 DIAGNOSIS — I619 Nontraumatic intracerebral hemorrhage, unspecified: Secondary | ICD-10-CM | POA: Diagnosis present

## 2020-10-29 DIAGNOSIS — N184 Chronic kidney disease, stage 4 (severe): Secondary | ICD-10-CM | POA: Diagnosis present

## 2020-10-29 DIAGNOSIS — Z8249 Family history of ischemic heart disease and other diseases of the circulatory system: Secondary | ICD-10-CM | POA: Diagnosis not present

## 2020-10-29 DIAGNOSIS — Z85828 Personal history of other malignant neoplasm of skin: Secondary | ICD-10-CM

## 2020-10-29 DIAGNOSIS — E785 Hyperlipidemia, unspecified: Secondary | ICD-10-CM | POA: Diagnosis present

## 2020-10-29 DIAGNOSIS — R109 Unspecified abdominal pain: Secondary | ICD-10-CM | POA: Diagnosis present

## 2020-10-29 DIAGNOSIS — N1832 Chronic kidney disease, stage 3b: Secondary | ICD-10-CM | POA: Diagnosis present

## 2020-10-29 DIAGNOSIS — S12041A Nondisplaced lateral mass fracture of first cervical vertebra, initial encounter for closed fracture: Secondary | ICD-10-CM | POA: Diagnosis present

## 2020-10-29 DIAGNOSIS — I6389 Other cerebral infarction: Secondary | ICD-10-CM | POA: Diagnosis not present

## 2020-10-29 DIAGNOSIS — D696 Thrombocytopenia, unspecified: Secondary | ICD-10-CM | POA: Diagnosis not present

## 2020-10-29 DIAGNOSIS — Z9114 Patient's other noncompliance with medication regimen: Secondary | ICD-10-CM

## 2020-10-29 DIAGNOSIS — I10 Essential (primary) hypertension: Secondary | ICD-10-CM

## 2020-10-29 DIAGNOSIS — R197 Diarrhea, unspecified: Secondary | ICD-10-CM | POA: Diagnosis not present

## 2020-10-29 DIAGNOSIS — N390 Urinary tract infection, site not specified: Secondary | ICD-10-CM | POA: Diagnosis present

## 2020-10-29 DIAGNOSIS — I618 Other nontraumatic intracerebral hemorrhage: Secondary | ICD-10-CM | POA: Diagnosis present

## 2020-10-29 DIAGNOSIS — E1122 Type 2 diabetes mellitus with diabetic chronic kidney disease: Secondary | ICD-10-CM | POA: Diagnosis present

## 2020-10-29 DIAGNOSIS — S12001A Unspecified nondisplaced fracture of first cervical vertebra, initial encounter for closed fracture: Secondary | ICD-10-CM

## 2020-10-29 DIAGNOSIS — K5900 Constipation, unspecified: Secondary | ICD-10-CM

## 2020-10-29 DIAGNOSIS — N3001 Acute cystitis with hematuria: Secondary | ICD-10-CM | POA: Diagnosis not present

## 2020-10-29 DIAGNOSIS — Z8616 Personal history of COVID-19: Secondary | ICD-10-CM | POA: Diagnosis not present

## 2020-10-29 DIAGNOSIS — W050XXA Fall from non-moving wheelchair, initial encounter: Secondary | ICD-10-CM | POA: Diagnosis present

## 2020-10-29 DIAGNOSIS — N182 Chronic kidney disease, stage 2 (mild): Secondary | ICD-10-CM | POA: Diagnosis not present

## 2020-10-29 DIAGNOSIS — N1831 Chronic kidney disease, stage 3a: Secondary | ICD-10-CM | POA: Diagnosis present

## 2020-10-29 DIAGNOSIS — M1990 Unspecified osteoarthritis, unspecified site: Secondary | ICD-10-CM | POA: Diagnosis present

## 2020-10-29 DIAGNOSIS — Z7984 Long term (current) use of oral hypoglycemic drugs: Secondary | ICD-10-CM

## 2020-10-29 DIAGNOSIS — E1140 Type 2 diabetes mellitus with diabetic neuropathy, unspecified: Secondary | ICD-10-CM | POA: Diagnosis present

## 2020-10-29 DIAGNOSIS — M179 Osteoarthritis of knee, unspecified: Secondary | ICD-10-CM | POA: Diagnosis present

## 2020-10-29 DIAGNOSIS — G936 Cerebral edema: Secondary | ICD-10-CM | POA: Diagnosis present

## 2020-10-29 DIAGNOSIS — Z833 Family history of diabetes mellitus: Secondary | ICD-10-CM | POA: Diagnosis not present

## 2020-10-29 DIAGNOSIS — Z808 Family history of malignant neoplasm of other organs or systems: Secondary | ICD-10-CM

## 2020-10-29 DIAGNOSIS — M1710 Unilateral primary osteoarthritis, unspecified knee: Secondary | ICD-10-CM | POA: Diagnosis present

## 2020-10-29 DIAGNOSIS — N179 Acute kidney failure, unspecified: Secondary | ICD-10-CM | POA: Diagnosis not present

## 2020-10-29 DIAGNOSIS — M797 Fibromyalgia: Secondary | ICD-10-CM | POA: Diagnosis present

## 2020-10-29 DIAGNOSIS — I1310 Hypertensive heart and chronic kidney disease without heart failure, with stage 1 through stage 4 chronic kidney disease, or unspecified chronic kidney disease: Secondary | ICD-10-CM | POA: Diagnosis present

## 2020-10-29 DIAGNOSIS — U071 COVID-19: Secondary | ICD-10-CM

## 2020-10-29 DIAGNOSIS — Z20822 Contact with and (suspected) exposure to covid-19: Secondary | ICD-10-CM | POA: Diagnosis present

## 2020-10-29 DIAGNOSIS — S0101XA Laceration without foreign body of scalp, initial encounter: Secondary | ICD-10-CM | POA: Diagnosis present

## 2020-10-29 DIAGNOSIS — K5732 Diverticulitis of large intestine without perforation or abscess without bleeding: Secondary | ICD-10-CM | POA: Diagnosis present

## 2020-10-29 DIAGNOSIS — IMO0002 Reserved for concepts with insufficient information to code with codable children: Secondary | ICD-10-CM

## 2020-10-29 DIAGNOSIS — E876 Hypokalemia: Secondary | ICD-10-CM | POA: Diagnosis not present

## 2020-10-29 DIAGNOSIS — Z8051 Family history of malignant neoplasm of kidney: Secondary | ICD-10-CM | POA: Diagnosis not present

## 2020-10-29 DIAGNOSIS — Z800 Family history of malignant neoplasm of digestive organs: Secondary | ICD-10-CM

## 2020-10-29 DIAGNOSIS — D509 Iron deficiency anemia, unspecified: Secondary | ICD-10-CM | POA: Diagnosis present

## 2020-10-29 DIAGNOSIS — Z801 Family history of malignant neoplasm of trachea, bronchus and lung: Secondary | ICD-10-CM | POA: Diagnosis not present

## 2020-10-29 DIAGNOSIS — G8929 Other chronic pain: Secondary | ICD-10-CM | POA: Diagnosis present

## 2020-10-29 DIAGNOSIS — B9620 Unspecified Escherichia coli [E. coli] as the cause of diseases classified elsewhere: Secondary | ICD-10-CM | POA: Diagnosis present

## 2020-10-29 DIAGNOSIS — W19XXXA Unspecified fall, initial encounter: Secondary | ICD-10-CM

## 2020-10-29 DIAGNOSIS — Z79899 Other long term (current) drug therapy: Secondary | ICD-10-CM

## 2020-10-29 DIAGNOSIS — S12001S Unspecified nondisplaced fracture of first cervical vertebra, sequela: Secondary | ICD-10-CM | POA: Diagnosis not present

## 2020-10-29 DIAGNOSIS — Z7982 Long term (current) use of aspirin: Secondary | ICD-10-CM

## 2020-10-29 DIAGNOSIS — R296 Repeated falls: Secondary | ICD-10-CM | POA: Diagnosis present

## 2020-10-29 DIAGNOSIS — Z888 Allergy status to other drugs, medicaments and biological substances status: Secondary | ICD-10-CM

## 2020-10-29 DIAGNOSIS — S12091S Other nondisplaced fracture of first cervical vertebra, sequela: Secondary | ICD-10-CM | POA: Diagnosis not present

## 2020-10-29 LAB — CBC WITH DIFFERENTIAL/PLATELET
Abs Immature Granulocytes: 0.03 10*3/uL (ref 0.00–0.07)
Basophils Absolute: 0 10*3/uL (ref 0.0–0.1)
Basophils Relative: 0 %
Eosinophils Absolute: 0.1 10*3/uL (ref 0.0–0.5)
Eosinophils Relative: 1 %
HCT: 35 % — ABNORMAL LOW (ref 36.0–46.0)
Hemoglobin: 11.9 g/dL — ABNORMAL LOW (ref 12.0–15.0)
Immature Granulocytes: 0 %
Lymphocytes Relative: 15 %
Lymphs Abs: 1.3 10*3/uL (ref 0.7–4.0)
MCH: 32.1 pg (ref 26.0–34.0)
MCHC: 34 g/dL (ref 30.0–36.0)
MCV: 94.3 fL (ref 80.0–100.0)
Monocytes Absolute: 0.5 10*3/uL (ref 0.1–1.0)
Monocytes Relative: 6 %
Neutro Abs: 6.5 10*3/uL (ref 1.7–7.7)
Neutrophils Relative %: 78 %
Platelets: 234 10*3/uL (ref 150–400)
RBC: 3.71 MIL/uL — ABNORMAL LOW (ref 3.87–5.11)
RDW: 13.8 % (ref 11.5–15.5)
WBC: 8.4 10*3/uL (ref 4.0–10.5)
nRBC: 0 % (ref 0.0–0.2)

## 2020-10-29 LAB — BASIC METABOLIC PANEL
Anion gap: 12 (ref 5–15)
BUN: 16 mg/dL (ref 8–23)
CO2: 24 mmol/L (ref 22–32)
Calcium: 9 mg/dL (ref 8.9–10.3)
Chloride: 101 mmol/L (ref 98–111)
Creatinine, Ser: 1.24 mg/dL — ABNORMAL HIGH (ref 0.44–1.00)
GFR, Estimated: 44 mL/min — ABNORMAL LOW (ref >60)
Glucose, Bld: 198 mg/dL — ABNORMAL HIGH (ref 70–99)
Potassium: 3.7 mmol/L (ref 3.5–5.1)
Sodium: 137 mmol/L (ref 135–145)

## 2020-10-29 MED ORDER — ACETAMINOPHEN 160 MG/5ML PO SOLN
650.0000 mg | ORAL | Status: DC | PRN
  Filled 2020-10-29: qty 20.3

## 2020-10-29 MED ORDER — GADOBUTROL 1 MMOL/ML IV SOLN
7.5000 mL | Freq: Once | INTRAVENOUS | Status: AC | PRN
  Administered 2020-10-29: 7.5 mL via INTRAVENOUS

## 2020-10-29 MED ORDER — IOHEXOL 350 MG/ML SOLN
60.0000 mL | Freq: Once | INTRAVENOUS | Status: AC | PRN
  Administered 2020-10-29: 60 mL via INTRAVENOUS

## 2020-10-29 MED ORDER — STROKE: EARLY STAGES OF RECOVERY BOOK: Freq: Once | Status: DC

## 2020-10-29 MED ORDER — LABETALOL HCL 5 MG/ML IV SOLN
10.0000 mg | Freq: Once | INTRAVENOUS | Status: AC
  Administered 2020-10-29: 10 mg via INTRAVENOUS
  Filled 2020-10-29: qty 4

## 2020-10-29 MED ORDER — NICARDIPINE HCL IN NACL 20-0.86 MG/200ML-% IV SOLN
3.0000 mg/h | INTRAVENOUS | Status: DC
Start: 2020-10-29 — End: 2020-10-30
  Administered 2020-10-30: 5 mg/h via INTRAVENOUS
  Filled 2020-10-29: qty 200

## 2020-10-29 MED ORDER — FENTANYL CITRATE PF 50 MCG/ML IJ SOSY
50.0000 ug | PREFILLED_SYRINGE | Freq: Once | INTRAMUSCULAR | Status: AC
  Administered 2020-10-29: 50 ug via INTRAVENOUS
  Filled 2020-10-29: qty 1

## 2020-10-29 MED ORDER — ACETAMINOPHEN 325 MG PO TABS
650.0000 mg | ORAL_TABLET | Freq: Once | ORAL | Status: AC
  Administered 2020-10-29: 650 mg via ORAL
  Filled 2020-10-29: qty 2

## 2020-10-29 MED ORDER — ONDANSETRON 4 MG PO TBDP
4.0000 mg | ORAL_TABLET | Freq: Once | ORAL | Status: AC
  Administered 2020-10-29: 4 mg via ORAL
  Filled 2020-10-29: qty 1

## 2020-10-29 MED ORDER — ACETAMINOPHEN 650 MG RE SUPP
650.0000 mg | RECTAL | Status: DC | PRN
  Filled 2020-10-29: qty 1

## 2020-10-29 MED ORDER — ACETAMINOPHEN 325 MG PO TABS
650.0000 mg | ORAL_TABLET | ORAL | Status: DC | PRN
  Administered 2020-10-30 – 2020-11-04 (×4): 650 mg via ORAL
  Filled 2020-10-29 (×4): qty 2

## 2020-10-29 NOTE — ED Triage Notes (Signed)
Pt brought in by [facility] with complaints of a fall, her son was wheeling her down the stairs and fell back, hitting the back of her head. No LOC

## 2020-10-29 NOTE — H&P (Addendum)
History and Physical    BERNISHA VERMA BWI:[id number] DOB: Nov 16, 1940 DOA: 10/29/2020  Referring MD/NP/PA:   PCP: Venia Carbon, MD   Patient coming from:  The patient is coming from home.  At baseline, pt is independent for most of ADL.        Chief Complaint: back and neck pain after fall.   HPI: [name] is a 80 y.o. female with medical history significant of HTN, HLD, T2DM, fibromyalgia, arthritis, iron deficiency anemia, CKD3b, GERD, COVID-19 infection and diverticulitis in Aug this year, presented to ED with back and neck pain after fall.   Pt fell backwards from wheel chair to brick steps while her son was wheeling her down the steps. She hit the back of her head, resulted some bleeding from scalp laceration. No loss of consciousness. She c/o back and neck pain.  She denies focal weakness numbness, difficulty speaking or blurry vision. she denies chest pain, cough, SOB, abdominal pain, nausea or vomit. She reports having physical decline for about 2 months because of COVID-19 infection and diverticulitis. Per chart review, A/P CT on 8/19 showed diverticulitis. COVID-19 positive on 8/20.  She has been having diarrhea, non bloody, multiple times a day, worse after food intake. She has been eating poorly, has not been taking her medications. She reports some abdominal cramping after diarrhea. She took a course of Augmentin, only made her diarrhea worse. She becomes generally weak, not able to ambulate. She felt off bed 2 weeks ago, resulted in worsening back pain. Lumbar spine MRI on 9/18 showed degenerative changes.   ED Course: she was afebrile, BP elevated 148/88-190/90, Hgb 11.9, at baseline, no leukocytosis, BUN/Cr 16/1.24 close to baseline, troponin 18, head CT noted mass liked lesion and nondisplaced C1 acute fracture. Head MRI: 2.5 x 1.1 x 1.8 cm well-circumscribed lenticular lesion involving the right lentiform nucleus/external capsule, favored to reflect an evolving subacute  intraparenchymal hematoma, possibly related to a subacute hemorrhagic infarct. Mild surrounding vasogenic edema without significant regional mass effect.  ED provider communicated with neurosurgery Dr. Lacinda Axon, who recommended admit for stroke work up.  Outpatient follow-up for C1 fracture. C collar applied.  Patient received Tylenol, fentanyl, 10 mg labetalol IV and Zofran in ED.   Review of Systems:   General: no fevers, chills, no body weight gain, generalized weakness HEENT: no blurry vision, hearing changes or sore throat Respiratory: no dyspnea, coughing, wheezing CV: no chest pain, no palpitations GI: no nausea, vomiting, + abdominal pain, + diarrhea,  GU: no dysuria, burning on urination, increased urinary frequency, hematuria  Ext: no leg edema Neuro: no unilateral weakness, numbness, or tingling, no vision change or hearing loss Skin: no rash, occipital scalp small laceration MSK: No muscle spasm, no deformity, no limitation of range of movement in spin Heme: No easy bruising.  Travel history: No recent long distant travel.  Allergy:  Allergies  Allergen Reactions   Diphenhydramine Hcl Other (See Comments)    hallucinations    Fluoxetine Hcl     REACTION: raised blood pressure   Lisinopril     REACTION: dizzy   Sertraline Hcl     REACTION: dizziness    Past Medical History:  Diagnosis Date   Allergic rhinitis    Anal fissure    Arthritis    Asthma    Depression    Diabetes mellitus    type 2   Diverticulosis    Fibromyalgia    GERD (gastroesophageal reflux disease)    Hypertension  Obesity    Osteoarthritis     Past Surgical History:  Procedure Laterality Date   ABDOMINAL HYSTERECTOMY  1970's   CATARACT EXTRACTION, BILATERAL     CHOLECYSTECTOMY  4/06   TUBAL LIGATION  1970's   VENTRAL HERNIA REPAIR  9/08   [place]    Social History:  reports that she has never smoked. She has never used smokeless tobacco. She reports that she does not drink alcohol  and does not use drugs.  Family History:  Family History  Problem Relation Age of Onset   Heart attack Father    Coronary artery disease Father    Kidney cancer Mother    Lung cancer Brother    Liver cancer Brother    Diabetes Other        Maternal side   Brain cancer Daughter    Hypertension Neg Hx    Breast cancer Neg Hx    Colon cancer Neg Hx      Prior to Admission medications   Medication Sig Start Date End Date Taking? Authorizing Provider  Accu-Chek Softclix Lancets lancets Use to obtain blood sugar sample Dx Code E11.40 10/16/20   Venia Carbon, MD  aspirin 81 MG tablet Take 81 mg by mouth daily. Patient not taking: Reported on 10/16/2020    [provider]  Cyanocobalamin (VITAMIN B12 PO) Take by mouth. Patient not taking: Reported on 10/16/2020    [provider]  doxazosin (CARDURA) 4 MG tablet TAKE 1/2 TABLET TWICE DAILY Patient not taking: Reported on 10/16/2020 12/12/19   Venia Carbon, MD  FeFum-FePoly-FA-B Cmp-C-Biot (INTEGRA PLUS) CAPS Take 1 capsule by mouth every morning. Patient not taking: Reported on 10/16/2020 09/19/14   Curt Bears, MD  gabapentin (NEURONTIN) 100 MG capsule TAKE 1 CAPSULE TWICE DAILY Patient not taking: Reported on 10/16/2020 12/12/19   Venia Carbon, MD  glipiZIDE (GLUCOTROL XL) 10 MG 24 hr tablet TAKE 1 TABLET TWICE DAILY Patient not taking: Reported on 10/16/2020 01/18/20   Venia Carbon, MD  Glucosamine-Chondroitin (COSAMIN DS PO) Take by mouth. Patient not taking: Reported on 10/16/2020    [provider]  glucose blood (ACCU-CHEK GUIDE) test strip Use to check blood sugar once daily Dx Code E11.40 10/16/20   Venia Carbon, MD  HYDROcodone-acetaminophen (NORCO) 5-325 MG tablet Take 1 tablet by mouth at bedtime. 10/26/20   Merlyn Lot, MD  hyoscyamine (LEVSIN) 0.125 MG tablet Take 1 tablet (0.125 mg total) by mouth 3 (three) times daily as needed. Before meals. Don't use imodium also 10/16/20   [name] I, MD  lidocaine (LIDODERM) 5 % Place 1 patch onto the skin every 12 (twelve) hours. Remove & Discard patch within 12 hours or as directed by MD 10/26/20 10/26/21  Merlyn Lot, MD  losartan-hydrochlorothiazide [facility]) 100-25 MG tablet TAKE 1 TABLET EVERY DAY Patient not taking: Reported on 10/16/2020 05/23/20   Venia Carbon, MD  metFORMIN (GLUCOPHAGE-XR) 750 MG 24 hr tablet TAKE 2 TABLETS EVERY DAY WITH BREAKFAST Patient not taking: Reported on 10/16/2020 01/18/20   Venia Carbon, MD  Multiple Vitamin (MULTIVITAMIN) tablet Take 1 tablet by mouth daily. Patient not taking: Reported on 10/16/2020    [provider]  ondansetron (ZOFRAN ODT) 4 MG disintegrating tablet Take 1 tablet (4 mg total) by mouth every 6 (six) hours as needed for nausea or vomiting. 10/27/20   Ward, Delice Bison, DO  OVER THE COUNTER MEDICATION [employer] Sleep Aid Patient not taking: Reported  on 10/16/2020    [provider]  oxyCODONE-acetaminophen (PERCOCET/ROXICET) 5-325 MG tablet Take 1 tablet by mouth every 6 (six) hours as needed. 10/27/20   Ward, Delice Bison, DO  potassium chloride SA (KLOR-CON) 20 MEQ tablet Take 1 tablet (20 mEq total) by mouth 2 (two) times daily for 5 days. 10/25/20 10/30/20  Lavonia Drafts, MD  tiZANidine (ZANAFLEX) 4 MG tablet TAKE 1 TABLET AT BEDTIME Patient not taking: Reported on 10/16/2020 06/05/20   [name] I, MD  venlafaxine [facility]) 37.5 MG tablet TAKE 1 TABLET TWICE DAILY Patient not taking: Reported on 10/16/2020 12/12/19   Venia Carbon, MD    Physical Exam: Vitals:   10/29/20 1447 10/29/20 1824 10/29/20 2122 10/29/20 2130  BP: (!) 161/105 (!) 148/88 (!) 190/90 (!) 172/87  Pulse: 80 78 86 75  Resp: 16 14 16    Temp: 97.8 F (36.6 C)  98 F (36.7 C)   TempSrc: Oral     SpO2: 99% 99% 98% 99%  Weight: 79.4 kg     Height: 5\' 4"  (1.626 m)      General: Not in acute distress HEENT:       Eyes: PERRL, EOMI, no scleral icterus.       ENT:  No discharge from the ears and nose.        Neck: No JVD, no bruit, no mass felt. Heme: No neck lymph node enlargement. Cardiac: S1/S2, RRR, No murmurs, No gallops or rubs. Respiratory: CTA. No rales, wheezing, rhonchi or rubs. GI: Soft, nondistended, nontender, no rebound pain, no organomegaly, BS present. GU: No hematuria Ext: No pitting leg edema bilaterally. 2+DP/PT pulse bilaterally. Musculoskeletal: No joint deformities, No joint redness or warmth, no limitation of ROM in spin. Skin: Small occipital scalp laceration.  Neuro: Alert, oriented X3, cranial nerves II-XII grossly intact, moves all extremities normally. Muscle strength 5/5 in all extremities, sensation to light touch intact.  Psych: Patient is not psychotic, no suicidal or hemocidal ideation.  CBC: Recent Labs  Lab 10/24/20 1855 10/26/20 1612 10/29/20 1947  WBC 6.0 7.8 8.4  NEUTROABS  --   --  6.5  HGB 12.0 11.9* 11.9*  HCT 34.2* 34.5* 35.0*  MCV 94.0 94.8 94.3  PLT 219 244 233   Basic Metabolic Panel: Recent Labs  Lab 10/24/20 1855 10/26/20 1612 10/26/20 1912 10/29/20 1947  NA 134* 136  --  137  K 3.1* 3.5  --  3.7  CL 98 100  --  101  CO2 27 23  --  24  GLUCOSE 260* 243*  --  198*  BUN 12 14  --  16  CREATININE 1.03* 1.15*  --  1.24*  CALCIUM 8.9 9.0  --  9.0  MG 1.8  --  1.8  --    GFR: Estimated Creatinine Clearance: 37.5 mL/min (A) (by C-G formula based on SCr of 1.24 mg/dL (H)). Liver Function Tests: Recent Labs  Lab 10/24/20 1855 10/26/20 1621  AST 41 35  ALT 25 26  ALKPHOS 66 73  BILITOT 1.2 1.7*  PROT 7.3 7.3  ALBUMIN 3.2* 3.3*   Recent Labs  Lab 10/24/20 1855  LIPASE 21   No results for input(s): AMMONIA in the last 168 hours. Coagulation Profile: No results for input(s): INR, PROTIME in the last 168 hours. Cardiac Enzymes: No results for input(s): CKTOTAL, CKMB, CKMBINDEX, TROPONINI in the last 168 hours. BNP (last 3 results) No results for input(s): PROBNP in the last  8760 hours. HbA1C: No results for  input(s): HGBA1C in the last 72 hours. CBG: Recent Labs  Lab 10/24/20 2312 10/26/20 1812  GLUCAP 198* 236*   Lipid Profile: No results for input(s): CHOL, HDL, LDLCALC, TRIG, CHOLHDL, LDLDIRECT in the last 72 hours. Thyroid Function Tests: No results for input(s): TSH, T4TOTAL, FREET4, T3FREE, THYROIDAB in the last 72 hours. Anemia Panel: No results for input(s): VITAMINB12, FOLATE, FERRITIN, TIBC, IRON, RETICCTPCT in the last 72 hours. Urine analysis:    Component Value Date/Time   COLORURINE YELLOW (A) 10/25/2020 0246   APPEARANCEUR HAZY (A) 10/25/2020 0246   LABSPEC 1.012 10/25/2020 0246   PHURINE 5.0 10/25/2020 0246   GLUCOSEU 50 (A) 10/25/2020 0246   HGBUR NEGATIVE 10/25/2020 0246   BILIRUBINUR NEGATIVE 10/25/2020 0246   KETONESUR 5 (A) 10/25/2020 0246   PROTEINUR 100 (A) 10/25/2020 0246   UROBILINOGEN 1.0 10/25/2006 0844   NITRITE NEGATIVE 10/25/2020 0246   LEUKOCYTESUR NEGATIVE 10/25/2020 0246   Sepsis Labs: @LABRCNTIP (procalcitonin:4,lacticidven:4) )No results found for this or any previous visit (from the past 240 hour(s)).   Radiological Exams on Admission: CT HEAD WO CONTRAST (5MM)  Result Date: 10/29/2020 CLINICAL DATA:  Fall from wheelchair, hit head, laceration to back of head, neck pain EXAM: CT HEAD WITHOUT CONTRAST CT CERVICAL SPINE WITHOUT CONTRAST TECHNIQUE: Multidetector CT imaging of the head and cervical spine was performed following the standard protocol without intravenous contrast. Multiplanar CT image reconstructions of the cervical spine were also generated. COMPARISON:  08/31/2020 FINDINGS: CT HEAD FINDINGS Brain: No evidence of acute infarction, hemorrhage, hydrocephalus, or extra-axial collection. There is a faintly rim hyperdense masslike lesion in the subinsular deep gray matter of the right hemisphere, in the vicinity of the external capsule and lentiform nuclei, measuring approximately 2.6 x 1.3 x 1.9 cm  (series 2, image 14, series 4, image 37). Mild periventricular white matter hypodensity. Vascular: No hyperdense vessel or unexpected calcification. Skull: Normal. Negative for fracture or focal lesion. Sinuses/Orbits: No acute finding. Other: Scalp contusion and laceration of the occiput (series 2, image 13). CT CERVICAL SPINE FINDINGS Alignment: Normal. Skull base and vertebrae: There is a nondisplaced fracture of the lateral left anterior arch of C1, extending into the left lateral mass and articular facets (series 2, image 30). No primary bone lesion or focal pathologic process. Soft tissues and spinal canal: No prevertebral fluid or swelling. No visible canal hematoma. Disc levels: Moderate disc space height loss and osteophytosis of the lower cervical levels. Upper chest: Negative. Other: None. IMPRESSION: 1. There is a faintly rim hyperdense masslike lesion in the subinsular deep gray matter of the right hemisphere, in the vicinity of the external capsule and lentiform nuclei, new compared to prior examination and highly suspicious for primary brain mass or metastasis. Recommend contrast enhanced MRI to further evaluate. 2. Nondisplaced acute fracture of the lateral left anterior arch of C1, extending into the left lateral mass and articular facets. No other fracture or static subluxation of the cervical spine. 3. Scalp contusion and laceration of the occiput. Electronically Signed   By: Eddie Candle M.D.   On: 10/29/2020 15:57   CT Cervical Spine Wo Contrast  Result Date: 10/29/2020 CLINICAL DATA:  Fall from wheelchair, hit head, laceration to back of head, neck pain EXAM: CT HEAD WITHOUT CONTRAST CT CERVICAL SPINE WITHOUT CONTRAST TECHNIQUE: Multidetector CT imaging of the head and cervical spine was performed following the standard protocol without intravenous contrast. Multiplanar CT image reconstructions of the cervical spine were also generated. COMPARISON:  08/31/2020 FINDINGS: CT HEAD FINDINGS  Brain: No evidence of acute infarction, hemorrhage, hydrocephalus, or extra-axial collection. There is a faintly rim hyperdense masslike lesion in the subinsular deep gray matter of the right hemisphere, in the vicinity of the external capsule and lentiform nuclei, measuring approximately 2.6 x 1.3 x 1.9 cm (series 2, image 14, series 4, image 37). Mild periventricular white matter hypodensity. Vascular: No hyperdense vessel or unexpected calcification. Skull: Normal. Negative for fracture or focal lesion. Sinuses/Orbits: No acute finding. Other: Scalp contusion and laceration of the occiput (series 2, image 13). CT CERVICAL SPINE FINDINGS Alignment: Normal. Skull base and vertebrae: There is a nondisplaced fracture of the lateral left anterior arch of C1, extending into the left lateral mass and articular facets (series 2, image 30). No primary bone lesion or focal pathologic process. Soft tissues and spinal canal: No prevertebral fluid or swelling. No visible canal hematoma. Disc levels: Moderate disc space height loss and osteophytosis of the lower cervical levels. Upper chest: Negative. Other: None. IMPRESSION: 1. There is a faintly rim hyperdense masslike lesion in the subinsular deep gray matter of the right hemisphere, in the vicinity of the external capsule and lentiform nuclei, new compared to prior examination and highly suspicious for primary brain mass or metastasis. Recommend contrast enhanced MRI to further evaluate. 2. Nondisplaced acute fracture of the lateral left anterior arch of C1, extending into the left lateral mass and articular facets. No other fracture or static subluxation of the cervical spine. 3. Scalp contusion and laceration of the occiput. Electronically Signed   By: Eddie Candle M.D.   On: 10/29/2020 15:57    MRI HEAD IMPRESSION: 10/29/2020  1. 2.5 x 1.1 x 1.8 cm well-circumscribed lenticular lesion involving the right lentiform nucleus/external capsule, favored to reflect  an evolving subacute intraparenchymal hematoma, possibly related to a subacute hemorrhagic infarct. Mild surrounding vasogenic edema without significant regional mass effect. While no definite underlying mass lesion is identified, a short interval follow-up MRI to ensure these changes resolve and no underlying lesion is present is recommended. 2. No other acute intracranial abnormality. 3. Soft tissue contusion at the left and central posterior parieto-occipital scalp.  MRI CERVICAL SPINE IMPRESSION: 10/29/2020  1. Acute nondisplaced fracture extending through the left anterior arch and lateral mass of C1, stable in position and alignment from prior CT. 2. No other acute traumatic injury within the cervical spine. No evidence for acute cord injury or ligamentous disruption. 3. Multilevel cervical spondylosis without significant spinal stenosis. Moderate to severe bilateral C5 and C6 foraminal narrowing as above.  Chest CTA, A/P CT with IV contrast 10/29/2020 IMPRESSION: 1. No evidence of significant pulmonary embolus. 2. No evidence of active pulmonary disease. 3. No acute process demonstrated in the abdomen or pelvis.  MRI lumbar spine 10/27/2020 1. Levoscoliosis with apex at L2-3. 2. Degenerative, moderate edema within the lower half of the L1 vertebral body may be a source of local low back pain. 3. Severe right L3-4 neural foraminal stenosis. 4. Moderate left L4-5 neural foraminal stenosis. 5. No spinal canal stenosis.  Assessment/Plan Active Problems:   Essential hypertension, benign   GERD   Fibromyalgia   Osteoarthritis, knee   Type 2 diabetes, uncontrolled, with neuropathy (HCC)   Iron deficiency anemia   Stage 3b chronic kidney disease ([place])   Hemorrhagic stroke (HCC)  [name] is a 80 y.o. female with medical history significant of HTN, HLD, T2DM, fibromyalgia, arthritis, iron deficiency anemia, CKD3b, GERD, COVID-19 infection and diverticulitis in Aug  this year, presented with  back and neck pain after fall.   Possible subacute hemorrhagic infarct --Head MRI: 2.5 x 1.1 x 1.8 cm well-circumscribed lenticular lesion involving the right lentiform nucleus/external capsule, favored to reflect an evolving subacute intraparenchymal hematoma, possibly related to a subacute hemorrhagic infarct. Mild surrounding vasogenic edema without significant regional mass effect.   --ED provider communicated with neurosurgery Dr. Lacinda Axon, who recommended admit for stroke work up. Message sent to Dr. Lacinda Axon for follow up consult. -- Currently no neuro deficit. -- BP elevated, 10 mg labetalol IV given in ED, SBP still > 180, will start nicardipine drip, BP goal 140-160. -- close monitoring in stepdown -- Check echo, carotid ultrasound, HbA1c and lipid panel -- neurology consult tomorrow, not able to reach at this time, message sent.  -- Physical therapy  S/p fall and back pain -- head/neck CT and MRI see above, suspect stroke other than hematoma -- scalp laceration no need suture.  -- C1 fracture on c collar, f/u patient per Dr. Lacinda Axon.  -- chest CTA and A/P CT negative.  -- Lumbar spine MRI on 9/18 showed degenerative changes.   Diarrhea -- h/o taking Augmentin for diverticulitis -- Check C. difficile and pathogen panel  HTN -- recently not take meds -- resume Cardura and Hyzaar  T2DM Not take home meds recently  Monitor glucose and give SSI  Chronic anemia Hgb 11.9, at baseline,   CKD 3b BUN/Cr 16/1.24 close to baseline  Fibromyalgia and chronic pain Effexor, attending, Percocet   DVT ppx: SCD Code Status: Full code Family Communication: son at bed side.               Disposition Plan:  Anticipate discharge back to previous home environment Consults called: Neurosurgery consulted by ED. Will need to consult neurology Admission status: Obs / tele  Inpatient/tele   medical floor/obs     SDU/inpation       Date of Service 10/29/2020    Anda Latina [facility]   If 7PM-7AM, please contact night-coverage [email or web address] Password [facility] 10/29/2020, 11:29 PM

## 2020-10-29 NOTE — ED Notes (Signed)
Pt back from Radiology at this time

## 2020-10-29 NOTE — ED Notes (Addendum)
Pt at radiology for MRI at this time

## 2020-10-29 NOTE — ED Provider Notes (Addendum)
[facility] Emergency Department Provider Note  ____________________________________________   Event Date/Time   First MD Initiated Contact with Patient 10/29/20 1903     (approximate)  I have reviewed the triage vital signs and the nursing notes.   HISTORY  Chief Complaint Fall    HPI [name] is a 80 y.o. female with diabetes,, hypertension who comes in with concerns for a fall.  Patient states that she was being wheeled down brick steps when she fell back and hit her head.  Reporting worsening pain in her neck and her back.  This is constant, severe, nothing makes it better, worse with movement.  Patient states that she has had a significant decline in the last 2 months secondary to [place] and having a run of diverticulitis.  She is on antibiotics for that after being seen at [facility] but continued to have severe back pain as well as decreased p.o. intake.   On review of records in the beginning of September patient had MRI that showed L1 vertebral body edema and some foraminal stenosis on 9/17.  She also had CT scan that showed diverticulosis with may be some early diverticulitis but they have opted to hold off on any antibiotics          Past Medical History:  Diagnosis Date   Allergic rhinitis    Anal fissure    Arthritis    Asthma    Depression    Diabetes mellitus    type 2   Diverticulosis    Fibromyalgia    GERD (gastroesophageal reflux disease)    Hypertension    Obesity    Osteoarthritis     Patient Active Problem List   Diagnosis Date Noted   Diarrhea 10/16/2020   Malnutrition of mild degree ([place]) 10/16/2020   Stage 3b chronic kidney disease ([place]) 12/25/2016   Right hand pain 07/12/2015   Advance directive discussed with patient 07/12/2015   Iron deficiency anemia 07/18/2014   Type 2 diabetes, uncontrolled, with neuropathy ([place]) 02/22/2012   Routine general medical examination at a health care facility 08/25/2011    Osteoarthritis, knee 08/25/2011   Fibromyalgia 02/25/2011   Obesity 02/25/2011   UTERINE PROLAPSE 02/25/2010   GERD 02/13/2009   CARCINOMA, BASAL CELL 05/18/2008   ALLERGIC RHINITIS 05/18/2008   Episodic mood disorder ([place]) 06/20/2007   Essential hypertension, benign 10/27/2006    Past Surgical History:  Procedure Laterality Date   ABDOMINAL HYSTERECTOMY  1970's   CATARACT EXTRACTION, BILATERAL     CHOLECYSTECTOMY  4/06   TUBAL LIGATION  1970's   VENTRAL HERNIA REPAIR  9/08   [place]    Prior to Admission medications   Medication Sig Start Date End Date Taking? Authorizing Provider  Accu-Chek Softclix Lancets lancets Use to obtain blood sugar sample Dx Code E11.40 10/16/20   Venia Carbon, MD  aspirin 81 MG tablet Take 81 mg by mouth daily. Patient not taking: Reported on 10/16/2020    [provider]  Cyanocobalamin (VITAMIN B12 PO) Take by mouth. Patient not taking: Reported on 10/16/2020    [provider]  doxazosin (CARDURA) 4 MG tablet TAKE 1/2 TABLET TWICE DAILY Patient not taking: Reported on 10/16/2020 12/12/19   Venia Carbon, MD  FeFum-FePoly-FA-B Cmp-C-Biot (INTEGRA PLUS) CAPS Take 1 capsule by mouth every morning. Patient not taking: Reported on 10/16/2020 09/19/14   Curt Bears, MD  gabapentin (NEURONTIN) 100 MG capsule TAKE 1 CAPSULE TWICE DAILY Patient not taking: Reported on 10/16/2020  12/12/19   Venia Carbon, MD  glipiZIDE (GLUCOTROL XL) 10 MG 24 hr tablet TAKE 1 TABLET TWICE DAILY Patient not taking: Reported on 10/16/2020 01/18/20   Venia Carbon, MD  Glucosamine-Chondroitin (COSAMIN DS PO) Take by mouth. Patient not taking: Reported on 10/16/2020    [provider]  glucose blood (ACCU-CHEK GUIDE) test strip Use to check blood sugar once daily Dx Code E11.40 10/16/20   Venia Carbon, MD  HYDROcodone-acetaminophen (NORCO) 5-325 MG tablet Take 1 tablet by mouth at bedtime. 10/26/20   Merlyn Lot, MD  hyoscyamine (LEVSIN)  0.125 MG tablet Take 1 tablet (0.125 mg total) by mouth 3 (three) times daily as needed. Before meals. Don't use imodium also 10/16/20   [name] I, MD  lidocaine (LIDODERM) 5 % Place 1 patch onto the skin every 12 (twelve) hours. Remove & Discard patch within 12 hours or as directed by MD 10/26/20 10/26/21  Merlyn Lot, MD  losartan-hydrochlorothiazide [facility]) 100-25 MG tablet TAKE 1 TABLET EVERY DAY Patient not taking: Reported on 10/16/2020 05/23/20   Venia Carbon, MD  metFORMIN (GLUCOPHAGE-XR) 750 MG 24 hr tablet TAKE 2 TABLETS EVERY DAY WITH BREAKFAST Patient not taking: Reported on 10/16/2020 01/18/20   Venia Carbon, MD  Multiple Vitamin (MULTIVITAMIN) tablet Take 1 tablet by mouth daily. Patient not taking: Reported on 10/16/2020    [provider]  ondansetron (ZOFRAN ODT) 4 MG disintegrating tablet Take 1 tablet (4 mg total) by mouth every 6 (six) hours as needed for nausea or vomiting. 10/27/20   Ward, Delice Bison, DO  OVER THE COUNTER MEDICATION [employer] Sleep Aid Patient not taking: Reported on 10/16/2020    [provider]  oxyCODONE-acetaminophen (PERCOCET/ROXICET) 5-325 MG tablet Take 1 tablet by mouth every 6 (six) hours as needed. 10/27/20   Ward, Delice Bison, DO  potassium chloride SA (KLOR-CON) 20 MEQ tablet Take 1 tablet (20 mEq total) by mouth 2 (two) times daily for 5 days. 10/25/20 10/30/20  Lavonia Drafts, MD  tiZANidine (ZANAFLEX) 4 MG tablet TAKE 1 TABLET AT BEDTIME Patient not taking: Reported on 10/16/2020 06/05/20   Venia Carbon, MD  venlafaxine [facility]) 37.5 MG tablet TAKE 1 TABLET TWICE DAILY Patient not taking: Reported on 10/16/2020 12/12/19   Venia Carbon, MD    Allergies Diphenhydramine hcl, Fluoxetine hcl, Lisinopril, and Sertraline hcl  Family History  Problem Relation Age of Onset   Heart attack Father    Coronary artery disease Father    Kidney cancer Mother    Lung cancer Brother    Liver cancer Brother     Diabetes Other        Maternal side   Brain cancer Daughter    Hypertension Neg Hx    Breast cancer Neg Hx    Colon cancer Neg Hx     Social History Social History   Tobacco Use   Smoking status: Never   Smokeless tobacco: Never  Vaping Use   Vaping Use: Never used  Substance Use Topics   Alcohol use: No    Alcohol/week: 0.0 standard drinks   Drug use: No      Review of Systems Constitutional: No fever/chills Eyes: No visual changes. ENT: No sore throat. Cardiovascular: Denies chest pain. Respiratory: Denies shortness of breath. Gastrointestinal: Decreased p.o. intake, abdominal pain Genitourinary: Negative for dysuria. Musculoskeletal: back pain, neck pain, headache  Skin: Negative for rash. Neurological: Negative for headaches, focal weakness or numbness. All other ROS negative  ____________________________________________   PHYSICAL EXAM:  VITAL SIGNS: ED Triage Vitals [10/29/20 1447]  Enc Vitals Group     BP (!) 161/105     Pulse Rate 80     Resp 16     Temp 97.8 F (36.6 C)     Temp Source Oral     SpO2 99 %     Weight 175 lb (79.4 kg)     Height 5\' 4"  (1.626 m)     Head Circumference      [place] Flow      Pain Score 10     Pain Loc      Pain Edu?      Excl. in [place]?     Constitutional: Alert and oriented. Well appearing and in no acute distress. Eyes: Conjunctivae are normal. EOMI. Head: Abrasion to the back of the head.  No active bleeding. Nose: No congestion/rhinnorhea. Mouth/Throat: Mucous membranes are moist.   Neck: No stridor. Trachea Midline. FROM Cardiovascular: Normal rate, regular rhythm. Grossly normal heart sounds.  Good peripheral circulation. Respiratory: Normal respiratory effort.  No retractions. Lungs CTAB. Gastrointestinal: slightly tender LLQ. No distention. No abdominal bruits.  Musculoskeletal: No lower extremity tenderness nor edema.  No joint effusions.  Able to lift both legs up off the bed.  Good strength in  arms. Neurologic:  Normal speech and language. No gross focal neurologic deficits are appreciated.  Equal strength in arms and legs.  Sensation intact.  Cranial nerves are intact. Skin:  Skin is warm, dry and intact. No rash noted. Psychiatric: Mood and affect are normal. Speech and behavior are normal. GU: Deferred   ____________________________________________   LABS (all labs ordered are listed, but only abnormal results are displayed)  Labs Reviewed  CBC WITH DIFFERENTIAL/PLATELET  BASIC METABOLIC PANEL   ____________________________________________  RADIOLOGY   Official radiology report(s): CT HEAD WO CONTRAST (5MM)  Result Date: 10/29/2020 CLINICAL DATA:  Fall from wheelchair, hit head, laceration to back of head, neck pain EXAM: CT HEAD WITHOUT CONTRAST CT CERVICAL SPINE WITHOUT CONTRAST TECHNIQUE: Multidetector CT imaging of the head and cervical spine was performed following the standard protocol without intravenous contrast. Multiplanar CT image reconstructions of the cervical spine were also generated. COMPARISON:  08/31/2020 FINDINGS: CT HEAD FINDINGS Brain: No evidence of acute infarction, hemorrhage, hydrocephalus, or extra-axial collection. There is a faintly rim hyperdense masslike lesion in the subinsular deep gray matter of the right hemisphere, in the vicinity of the external capsule and lentiform nuclei, measuring approximately 2.6 x 1.3 x 1.9 cm (series 2, image 14, series 4, image 37). Mild periventricular white matter hypodensity. Vascular: No hyperdense vessel or unexpected calcification. Skull: Normal. Negative for fracture or focal lesion. Sinuses/Orbits: No acute finding. Other: Scalp contusion and laceration of the occiput (series 2, image 13). CT CERVICAL SPINE FINDINGS Alignment: Normal. Skull base and vertebrae: There is a nondisplaced fracture of the lateral left anterior arch of C1, extending into the left lateral mass and articular facets (series 2, image 30).  No primary bone lesion or focal pathologic process. Soft tissues and spinal canal: No prevertebral fluid or swelling. No visible canal hematoma. Disc levels: Moderate disc space height loss and osteophytosis of the lower cervical levels. Upper chest: Negative. Other: None. IMPRESSION: 1. There is a faintly rim hyperdense masslike lesion in the subinsular deep gray matter of the right hemisphere, in the vicinity of the external capsule and lentiform nuclei, new compared to prior examination and highly suspicious for primary brain mass or  metastasis. Recommend contrast enhanced MRI to further evaluate. 2. Nondisplaced acute fracture of the lateral left anterior arch of C1, extending into the left lateral mass and articular facets. No other fracture or static subluxation of the cervical spine. 3. Scalp contusion and laceration of the occiput. Electronically Signed   By: Eddie Candle M.D.   On: 10/29/2020 15:57   CT Cervical Spine Wo Contrast  Result Date: 10/29/2020 CLINICAL DATA:  Fall from wheelchair, hit head, laceration to back of head, neck pain EXAM: CT HEAD WITHOUT CONTRAST CT CERVICAL SPINE WITHOUT CONTRAST TECHNIQUE: Multidetector CT imaging of the head and cervical spine was performed following the standard protocol without intravenous contrast. Multiplanar CT image reconstructions of the cervical spine were also generated. COMPARISON:  08/31/2020 FINDINGS: CT HEAD FINDINGS Brain: No evidence of acute infarction, hemorrhage, hydrocephalus, or extra-axial collection. There is a faintly rim hyperdense masslike lesion in the subinsular deep gray matter of the right hemisphere, in the vicinity of the external capsule and lentiform nuclei, measuring approximately 2.6 x 1.3 x 1.9 cm (series 2, image 14, series 4, image 37). Mild periventricular white matter hypodensity. Vascular: No hyperdense vessel or unexpected calcification. Skull: Normal. Negative for fracture or focal lesion. Sinuses/Orbits: No acute  finding. Other: Scalp contusion and laceration of the occiput (series 2, image 13). CT CERVICAL SPINE FINDINGS Alignment: Normal. Skull base and vertebrae: There is a nondisplaced fracture of the lateral left anterior arch of C1, extending into the left lateral mass and articular facets (series 2, image 30). No primary bone lesion or focal pathologic process. Soft tissues and spinal canal: No prevertebral fluid or swelling. No visible canal hematoma. Disc levels: Moderate disc space height loss and osteophytosis of the lower cervical levels. Upper chest: Negative. Other: None. IMPRESSION: 1. There is a faintly rim hyperdense masslike lesion in the subinsular deep gray matter of the right hemisphere, in the vicinity of the external capsule and lentiform nuclei, new compared to prior examination and highly suspicious for primary brain mass or metastasis. Recommend contrast enhanced MRI to further evaluate. 2. Nondisplaced acute fracture of the lateral left anterior arch of C1, extending into the left lateral mass and articular facets. No other fracture or static subluxation of the cervical spine. 3. Scalp contusion and laceration of the occiput. Electronically Signed   By: Eddie Candle M.D.   On: 10/29/2020 15:57    ____________________________________________   PROCEDURES  Procedure(s) performed (including Critical Care):  Procedures   ____________________________________________   INITIAL IMPRESSION / ASSESSMENT AND PLAN / ED COURSE  [name] was evaluated in Emergency Department on 10/29/2020 for the symptoms described in the history of present illness. She was evaluated in the context of the global COVID-19 pandemic, which necessitated consideration that the patient might be at risk for infection with the SARS-CoV-2 virus that causes COVID-19. Institutional protocols and algorithms that pertain to the evaluation of patients at risk for COVID-19 are in a state of rapid change based on  information released by regulatory bodies including the CDC and federal and state organizations. These policies and algorithms were followed during the patient's care in the ED.    Patient comes in after mechanical fall with CT head concerning for intercranial mass as well as CT cervical concerning for cervical fracture.  I did discuss the case with Dr. Lacinda Axon who recommended a c-collar and these be managed conservatively with collar but will get an MRI in case she ends up needing a biopsy/surgery of the brain mass.  We will get a pan CT scan to evaluate for any kind of cancer that could have spread to her brain we will redo her CT abdomen given continued progressive not eating, diarrhea to see if the diverticulitis is getting any worse that would require another course of antibiotics.  Given the worsening back pain we will get some reformats to make sure did not fracture anything else.  We will get MRI brain to further evaluate the mass.  Discussed with Dr. Lacinda Axon who recommended an MRI of the brain as well as CT scans look for any other cancer sources  MRI shows concern for potential subacute hemorrhagic infarct.  Does recommend a follow-up MRI to ensure resolution.  We will give some labetalol to help with the blood pressure and discussed the hospital team for admission.  For the C1 cervical fracture this is typically nonoperative can be followed up outpatient with Dr. Lacinda Axon and will need to remain in the collar.  CT scans otherwise did not show any other acute pathology.  We will discussed the hospital team for admission  Patient is also seen that she been having lots of diarrhea in the setting of recent antibiotics.  CT scan without any evidence of diverticulitis at this time.  We will also add on stool studies to make sure no evidence of C. difficile or other pathogen given the continued significant diarrhea         ____________________________________________   FINAL CLINICAL IMPRESSION(S) /  ED DIAGNOSES   Final diagnoses:  Fall  Hemorrhagic stroke ([place])      MEDICATIONS GIVEN DURING THIS VISIT:  Medications  labetalol (NORMODYNE) injection 10 mg (has no administration in time range)  ondansetron (ZOFRAN-ODT) disintegrating tablet 4 mg (4 mg Oral Given 10/29/20 1455)  acetaminophen (TYLENOL) tablet 650 mg (650 mg Oral Given 10/29/20 1456)  fentaNYL (SUBLIMAZE) injection 50 mcg (50 mcg Intravenous Given 10/29/20 2127)  gadobutrol (GADAVIST) 1 MMOL/ML injection 7.5 mL (7.5 mLs Intravenous Contrast Given 10/29/20 2050)  iohexol (OMNIPAQUE) 350 MG/ML injection 60 mL (60 mLs Intravenous Contrast Given 10/29/20 2052)     ED Discharge Orders     None        Note:  This document was prepared using Dragon voice recognition software and may include unintentional dictation errors.    Vanessa [place], MD 10/29/20 2201    Vanessa [place], MD 10/29/20 2213

## 2020-10-29 NOTE — ED Triage Notes (Signed)
First Nurse Note:  Son wheeling patient down brick steps, fell back and hit head.  No LOC.  Laceration to back of head.  Bleeding controlled.  C/O neck pain.  Also c/o chronic back and right hip pain.  Vs wnl.

## 2020-10-30 ENCOUNTER — Inpatient Hospital Stay: Payer: Medicare PPO

## 2020-10-30 ENCOUNTER — Ambulatory Visit: Payer: Medicare PPO | Admitting: Internal Medicine

## 2020-10-30 ENCOUNTER — Inpatient Hospital Stay (HOSPITAL_COMMUNITY)
Admit: 2020-10-30 | Discharge: 2020-10-30 | Disposition: A | Discharge status: Home / Self Care | Payer: Medicare PPO | Attending: Internal Medicine | Admitting: Internal Medicine

## 2020-10-30 DIAGNOSIS — S12001A Unspecified nondisplaced fracture of first cervical vertebra, initial encounter for closed fracture: Secondary | ICD-10-CM

## 2020-10-30 DIAGNOSIS — W19XXXA Unspecified fall, initial encounter: Secondary | ICD-10-CM | POA: Diagnosis not present

## 2020-10-30 DIAGNOSIS — I6389 Other cerebral infarction: Secondary | ICD-10-CM | POA: Diagnosis not present

## 2020-10-30 DIAGNOSIS — E876 Hypokalemia: Secondary | ICD-10-CM

## 2020-10-30 DIAGNOSIS — I619 Nontraumatic intracerebral hemorrhage, unspecified: Secondary | ICD-10-CM | POA: Diagnosis not present

## 2020-10-30 LAB — CBC
HCT: 31.1 % — ABNORMAL LOW (ref 36.0–46.0)
Hemoglobin: 10.6 g/dL — ABNORMAL LOW (ref 12.0–15.0)
MCH: 31.6 pg (ref 26.0–34.0)
MCHC: 34.1 g/dL (ref 30.0–36.0)
MCV: 92.8 fL (ref 80.0–100.0)
Platelets: 200 10*3/uL (ref 150–400)
RBC: 3.35 MIL/uL — ABNORMAL LOW (ref 3.87–5.11)
RDW: 13.7 % (ref 11.5–15.5)
WBC: 6.3 10*3/uL (ref 4.0–10.5)
nRBC: 0 % (ref 0.0–0.2)

## 2020-10-30 LAB — BASIC METABOLIC PANEL
Anion gap: 11 (ref 5–15)
BUN: 14 mg/dL (ref 8–23)
CO2: 22 mmol/L (ref 22–32)
Calcium: 7.9 mg/dL — ABNORMAL LOW (ref 8.9–10.3)
Chloride: 104 mmol/L (ref 98–111)
Creatinine, Ser: 1.06 mg/dL — ABNORMAL HIGH (ref 0.44–1.00)
GFR, Estimated: 53 mL/min — ABNORMAL LOW (ref >60)
Glucose, Bld: 170 mg/dL — ABNORMAL HIGH (ref 70–99)
Potassium: 3.2 mmol/L — ABNORMAL LOW (ref 3.5–5.1)
Sodium: 137 mmol/L (ref 135–145)

## 2020-10-30 LAB — GASTROINTESTINAL PANEL BY PCR, STOOL (REPLACES STOOL CULTURE)

## 2020-10-30 LAB — RESP PANEL BY RT-PCR (FLU A&B, COVID) ARPGX2
Influenza A by PCR: NEGATIVE
Influenza B by PCR: NEGATIVE
SARS Coronavirus 2 by RT PCR: NEGATIVE

## 2020-10-30 LAB — LIPID PANEL
Cholesterol: 105 mg/dL (ref 0–200)
HDL: 36 mg/dL — ABNORMAL LOW (ref >40)
LDL Cholesterol: 46 mg/dL (ref 0–99)
Total CHOL/HDL Ratio: 2.9 RATIO
Triglycerides: 116 mg/dL (ref <150)
VLDL: 23 mg/dL (ref 0–40)

## 2020-10-30 LAB — C DIFFICILE QUICK SCREEN W PCR REFLEX
C Diff antigen: NEGATIVE
C Diff interpretation: NOT DETECTED
C Diff toxin: NEGATIVE

## 2020-10-30 LAB — MAGNESIUM: Magnesium: 1.9 mg/dL (ref 1.7–2.4)

## 2020-10-30 MED ORDER — POTASSIUM CHLORIDE CRYS ER 20 MEQ PO TBCR: 40.0000 meq | EXTENDED_RELEASE_TABLET | Freq: Once | ORAL | Status: DC

## 2020-10-30 MED ORDER — GABAPENTIN 100 MG PO CAPS
100.0000 mg | ORAL_CAPSULE | Freq: Two times a day (BID) | ORAL | Status: DC
  Administered 2020-10-30 – 2020-11-07 (×17): 100 mg via ORAL
  Filled 2020-10-30 (×17): qty 1

## 2020-10-30 MED ORDER — OXYCODONE-ACETAMINOPHEN 5-325 MG PO TABS
1.0000 | ORAL_TABLET | Freq: Four times a day (QID) | ORAL | Status: DC | PRN
  Administered 2020-10-30 – 2020-11-05 (×14): 1 via ORAL
  Filled 2020-10-30 (×14): qty 1

## 2020-10-30 MED ORDER — SODIUM CHLORIDE 0.9 % IV SOLN
8.0000 mg | Freq: Three times a day (TID) | INTRAVENOUS | Status: DC | PRN
  Administered 2020-10-30: 8 mg via INTRAVENOUS
  Filled 2020-10-30 (×2): qty 4

## 2020-10-30 MED ORDER — LOSARTAN POTASSIUM-HCTZ 100-25 MG PO TABS: 1.0000 | ORAL_TABLET | Freq: Every day | ORAL | Status: DC

## 2020-10-30 MED ORDER — LOPERAMIDE HCL 2 MG PO CAPS: 2.0000 mg | ORAL_CAPSULE | Freq: Two times a day (BID) | ORAL | Status: DC | PRN

## 2020-10-30 MED ORDER — LABETALOL HCL 5 MG/ML IV SOLN
10.0000 mg | INTRAVENOUS | Status: DC | PRN
  Administered 2020-11-04: 10 mg via INTRAVENOUS
  Filled 2020-10-30 (×2): qty 4

## 2020-10-30 MED ORDER — HYDROCHLOROTHIAZIDE 25 MG PO TABS
25.0000 mg | ORAL_TABLET | Freq: Every day | ORAL | Status: DC
  Administered 2020-10-30 – 2020-11-07 (×8): 25 mg via ORAL
  Filled 2020-10-30 (×7): qty 1

## 2020-10-30 MED ORDER — DOXAZOSIN MESYLATE 2 MG PO TABS
2.0000 mg | ORAL_TABLET | Freq: Every day | ORAL | Status: DC
  Administered 2020-10-30 – 2020-11-06 (×7): 2 mg via ORAL
  Filled 2020-10-30 (×10): qty 1

## 2020-10-30 MED ORDER — LOSARTAN POTASSIUM 50 MG PO TABS
100.0000 mg | ORAL_TABLET | Freq: Every day | ORAL | Status: DC
  Administered 2020-10-30 – 2020-11-07 (×8): 100 mg via ORAL
  Filled 2020-10-30 (×8): qty 2

## 2020-10-30 MED ORDER — VENLAFAXINE HCL 37.5 MG PO TABS
37.5000 mg | ORAL_TABLET | Freq: Two times a day (BID) | ORAL | Status: DC
  Administered 2020-10-30 – 2020-11-07 (×15): 37.5 mg via ORAL
  Filled 2020-10-30 (×21): qty 1

## 2020-10-30 NOTE — Progress Notes (Signed)
PT Cancellation Note  Patient Details Name: [name] MRN: [id number] DOB: 03-29-1940   Cancelled Treatment:    Reason Eval/Treat Not Completed: Medical issues which prohibited therapy Spoke with nurse who reports that pt has been feeling terrible and has been dealing with a lot of nausea/vomiting.  She does have cervical collar donned, but has had issues with labile BP and per imaging may have had an infarct.  Awaiting Neuro consult.  Will hold PT at this time with plan to initiate PT at later time/date when pt is more appropriate for participation.   Kreg Shropshire, DPT 10/30/2020, 12:28 PM

## 2020-10-30 NOTE — Progress Notes (Signed)
*  PRELIMINARY RESULTS* Echocardiogram 2D Echocardiogram has been performed.  [name] 10/30/2020, 9:29 AM

## 2020-10-30 NOTE — ED Notes (Signed)
Patient having nausea- MD paged for PRN.

## 2020-10-30 NOTE — Consult Note (Signed)
NEURO HOSPITALIST CONSULT NOTE   Requestig physician: Dr. Jimmye Norman  Reason for Consult: Right basal ganglia hemorrhage  History obtained from:   Patient and Chart     HPI:                                                                                                                                          [name] is an 80 y.o. female with a PMHx significant for DM, HTN and arthritis who presented to the ED yesterday after she hit the back of her head due to a fall backwards in her wheelchair while her son was wheeling her down the steps. CT head in the ED showed a questionable right basal ganglia mass versus hemorrhage; therefore, an MRI was obtained. MRI revealed a subacute hemorrhagic lesion involving the right basal ganglia. She does have a family history of brain cancer (daughter); however, the lesion seen on MRI appears more consistent with a hypertensive hemorrhage or diffuse petechial hemorrhagic conversion of an ischemic stroke.   Also imaged was her neck, revealing a C1 fracture which has been evaluated by Neurosurgery.     Home medications include ASA.   Past Medical History:  Diagnosis Date   Allergic rhinitis    Anal fissure    Arthritis    Asthma    Depression    Diabetes mellitus    type 2   Diverticulosis    Fibromyalgia    GERD (gastroesophageal reflux disease)    Hypertension    Obesity    Osteoarthritis     Past Surgical History:  Procedure Laterality Date   ABDOMINAL HYSTERECTOMY  1970's   CATARACT EXTRACTION, BILATERAL     CHOLECYSTECTOMY  4/06   TUBAL LIGATION  1970's   VENTRAL HERNIA REPAIR  9/08   [facility]    Family History  Problem Relation Age of Onset   Heart attack Father    Coronary artery disease Father    Kidney cancer Mother    Lung cancer Brother    Liver cancer Brother    Diabetes Other        Maternal side   Brain cancer Daughter    Hypertension Neg Hx    Breast cancer Neg Hx    Colon cancer Neg Hx              Social History:  reports that she has never smoked. She has never used smokeless tobacco. She reports that she does not drink alcohol and does not use drugs.  Allergies  Allergen Reactions   Diphenhydramine Hcl Other (See Comments)    hallucinations    Fluoxetine Hcl     REACTION: raised blood pressure   Lisinopril     REACTION: dizzy   Sertraline Hcl  REACTION: dizziness    MEDICATIONS:                                                                                                                      No current facility-administered medications on file prior to encounter.   Current Outpatient Medications on File Prior to Encounter  Medication Sig Dispense Refill   HYDROcodone-acetaminophen (NORCO) 5-325 MG tablet Take 1 tablet by mouth at bedtime. 4 tablet 0   hyoscyamine (LEVSIN) 0.125 MG tablet Take 1 tablet (0.125 mg total) by mouth 3 (three) times daily as needed. Before meals. Don't use imodium also 30 tablet 0   lidocaine (LIDODERM) 5 % Place 1 patch onto the skin every 12 (twelve) hours. Remove & Discard patch within 12 hours or as directed by MD 10 patch 0   ondansetron (ZOFRAN ODT) 4 MG disintegrating tablet Take 1 tablet (4 mg total) by mouth every 6 (six) hours as needed for nausea or vomiting. 20 tablet 0   oxyCODONE-acetaminophen (PERCOCET/ROXICET) 5-325 MG tablet Take 1 tablet by mouth every 6 (six) hours as needed. 12 tablet 0   Accu-Chek Softclix Lancets lancets Use to obtain blood sugar sample Dx Code E11.40 100 each 3   aspirin 81 MG tablet Take 81 mg by mouth daily. (Patient not taking: No sig reported)     Cyanocobalamin (VITAMIN B12 PO) Take by mouth. (Patient not taking: No sig reported)     doxazosin (CARDURA) 4 MG tablet TAKE 1/2 TABLET TWICE DAILY (Patient not taking: No sig reported) 90 tablet 3   FeFum-FePoly-FA-B Cmp-C-Biot (INTEGRA PLUS) CAPS Take 1 capsule by mouth every morning. (Patient not taking: No sig reported) 30 capsule 2    gabapentin (NEURONTIN) 100 MG capsule TAKE 1 CAPSULE TWICE DAILY (Patient not taking: No sig reported) 180 capsule 3   glipiZIDE (GLUCOTROL XL) 10 MG 24 hr tablet TAKE 1 TABLET TWICE DAILY (Patient not taking: No sig reported) 180 tablet 3   Glucosamine-Chondroitin (COSAMIN DS PO) Take by mouth. (Patient not taking: No sig reported)     glucose blood (ACCU-CHEK GUIDE) test strip Use to check blood sugar once daily Dx Code E11.40 100 each 3   losartan-hydrochlorothiazide (HYZAAR) 100-25 MG tablet TAKE 1 TABLET EVERY DAY (Patient not taking: No sig reported) 90 tablet 3   metFORMIN (GLUCOPHAGE-XR) 750 MG 24 hr tablet TAKE 2 TABLETS EVERY DAY WITH BREAKFAST (Patient not taking: No sig reported) 180 tablet 3   Multiple Vitamin (MULTIVITAMIN) tablet Take 1 tablet by mouth daily. (Patient not taking: No sig reported)     OVER THE COUNTER MEDICATION Knock Out All Natural Sleep Aid (Patient not taking: No sig reported)     potassium chloride SA (KLOR-CON) 20 MEQ tablet Take 1 tablet (20 mEq total) by mouth 2 (two) times daily for 5 days. (Patient not taking: No sig reported) 10 tablet 0   tiZANidine (ZANAFLEX) 4 MG tablet TAKE 1 TABLET AT BEDTIME (Patient not taking: No sig reported) 90 tablet 1  venlafaxine (EFFEXOR) 37.5 MG tablet TAKE 1 TABLET TWICE DAILY (Patient not taking: No sig reported) 180 tablet 3     ROS:                                                                                                                                       Has had recent falls prior to the most recent fall backwards in wheelchair. As per HPI. The patient does not endorse any additional symptoms.    Blood pressure (!) 92/57, pulse 63, temperature 98 F (36.7 C), resp. rate 12, height 5\' 4"  (1.626 m), weight 79.4 kg, SpO2 94 %.   General Examination:                                                                                                       Physical Exam  HEENT-  Face is without bruising. C-collar  in place.    Lungs- Respirations unlabored Extremities- Warm and well-perfused  Neurological Examination Mental Status: Awake and alert. Affect mildly dysthymic but appropriate to the situation. Speech fluent with intact comprehension and naming. No dysarthria.  Cranial Nerves: II:  Temporal visual fields intact with no extinction to DSS. PERRL.  III,IV, VI: No ptosis. EOMI with saccadic pursuits to the right noted.  V: Facial temp sensation equal bilaterally VII: Smile symmetric. No facial droop.  VIII: Hearing intact to voice IX,X: No hypophonia XI: Symmetric XII: Midline tongue extension Motor: RUE and RLE: 5/5 LUE 5/5 except for 4/5 deltoid LLE 5/5 No pronator drift.  Sensory: Decreased temp sensation to LUE and LLE. Positive for extinction on the left.   Deep Tendon Reflexes: 2+ and symmetric throughout Right toe downgoing, left toe upgoing.  Cerebellar: No ataxia with FNF bilaterally  Gait: Deferred   Lab Results: Basic Metabolic Panel: Recent Labs  Lab 10/24/20 1855 10/26/20 1612 10/26/20 1912 10/29/20 1947 10/30/20 0503  NA 134* 136  --  137 137  K 3.1* 3.5  --  3.7 3.2*  CL 98 100  --  101 104  CO2 27 23  --  24 22  GLUCOSE 260* 243*  --  198* 170*  BUN 12 14  --  16 14  CREATININE 1.03* 1.15*  --  1.24* 1.06*  CALCIUM 8.9 9.0  --  9.0 7.9*  MG 1.8  --  1.8  --  1.9    CBC: Recent Labs  Lab 10/24/20 1855 10/26/20 1612 10/29/20 1947 10/30/20 0503  WBC 6.0 7.8  8.4 6.3  NEUTROABS  --   --  6.5  --   HGB 12.0 11.9* 11.9* 10.6*  HCT 34.2* 34.5* 35.0* 31.1*  MCV 94.0 94.8 94.3 92.8  PLT 219 244 234 200    Cardiac Enzymes: No results for input(s): CKTOTAL, CKMB, CKMBINDEX, TROPONINI in the last 168 hours.  Lipid Panel: Recent Labs  Lab 10/30/20 0503  CHOL 105  TRIG 116  HDL 36*  CHOLHDL 2.9  VLDL 23  LDLCALC 46    Imaging: CT HEAD WO CONTRAST (5MM)  Result Date: 10/29/2020 CLINICAL DATA:  Fall from wheelchair, hit head, laceration to  back of head, neck pain EXAM: CT HEAD WITHOUT CONTRAST CT CERVICAL SPINE WITHOUT CONTRAST TECHNIQUE: Multidetector CT imaging of the head and cervical spine was performed following the standard protocol without intravenous contrast. Multiplanar CT image reconstructions of the cervical spine were also generated. COMPARISON:  08/31/2020 FINDINGS: CT HEAD FINDINGS Brain: No evidence of acute infarction, hemorrhage, hydrocephalus, or extra-axial collection. There is a faintly rim hyperdense masslike lesion in the subinsular deep gray matter of the right hemisphere, in the vicinity of the external capsule and lentiform nuclei, measuring approximately 2.6 x 1.3 x 1.9 cm (series 2, image 14, series 4, image 37). Mild periventricular white matter hypodensity. Vascular: No hyperdense vessel or unexpected calcification. Skull: Normal. Negative for fracture or focal lesion. Sinuses/Orbits: No acute finding. Other: Scalp contusion and laceration of the occiput (series 2, image 13). CT CERVICAL SPINE FINDINGS Alignment: Normal. Skull base and vertebrae: There is a nondisplaced fracture of the lateral left anterior arch of C1, extending into the left lateral mass and articular facets (series 2, image 30). No primary bone lesion or focal pathologic process. Soft tissues and spinal canal: No prevertebral fluid or swelling. No visible canal hematoma. Disc levels: Moderate disc space height loss and osteophytosis of the lower cervical levels. Upper chest: Negative. Other: None. IMPRESSION: 1. There is a faintly rim hyperdense masslike lesion in the subinsular deep gray matter of the right hemisphere, in the vicinity of the external capsule and lentiform nuclei, new compared to prior examination and highly suspicious for primary brain mass or metastasis. Recommend contrast enhanced MRI to further evaluate. 2. Nondisplaced acute fracture of the lateral left anterior arch of C1, extending into the left lateral mass and articular facets.  No other fracture or static subluxation of the cervical spine. 3. Scalp contusion and laceration of the occiput. Electronically Signed   By: Eddie Candle M.D.   On: 10/29/2020 15:57   CT Angio Chest PE W and/or Wo Contrast  Result Date: 10/29/2020 CLINICAL DATA:  Pulmonary embolus suspected with high probability. Recent fall. Chronic back and right hip pain. EXAM: CT ANGIOGRAPHY CHEST CT ABDOMEN AND PELVIS WITH CONTRAST TECHNIQUE: Multidetector CT imaging of the chest was performed using the standard protocol during bolus administration of intravenous contrast. Multiplanar CT image reconstructions and MIPs were obtained to evaluate the vascular anatomy. Multidetector CT imaging of the abdomen and pelvis was performed using the standard protocol during bolus administration of intravenous contrast. CONTRAST:  71mL OMNIPAQUE IOHEXOL 350 MG/ML SOLN COMPARISON:  CT abdomen and pelvis 10/26/2020 FINDINGS: CTA CHEST FINDINGS Cardiovascular: Good opacification of the central and segmental pulmonary arteries. No focal filling defects. No evidence of significant pulmonary embolus. Cardiac enlargement. No pericardial effusions. Mild coronary artery and aortic calcifications. No aortic dissection. Great vessel origins are patent. Mediastinum/Nodes: Esophagus is decompressed. No significant lymphadenopathy in the chest. Thyroid gland is unremarkable. Lungs/Pleura: Mild dependent  changes in the lung bases. No airspace disease or consolidation. No pleural effusions. No pneumothorax. Musculoskeletal: Degenerative changes in the spine and shoulders. No destructive bone lesions identified. Review of the MIP images confirms the above findings. CT ABDOMEN and PELVIS FINDINGS Hepatobiliary: Mild diffuse fatty infiltration of the liver. No focal lesions identified. Surgical absence of the gallbladder. No bile duct dilatation. Pancreas: Unremarkable. No pancreatic ductal dilatation or surrounding inflammatory changes. Spleen: Normal  in size without focal abnormality. Adrenals/Urinary Tract: Adrenal glands are unremarkable. Kidneys are normal, without renal calculi, focal lesion, or hydronephrosis. Bladder is unremarkable. Stomach/Bowel: Stomach, small bowel, and colon are not abnormally distended. Prominent submucosal fat in the right colon is likely related to obesity. Diverticulosis of the sigmoid colon. Stranding has improved since prior study without significant residual inflammatory change. Appendix is normal. Vascular/Lymphatic: Aortic atherosclerosis. No enlarged abdominal or pelvic lymph nodes. Reproductive: Status post hysterectomy. No adnexal masses. Other: No free air or free fluid in the abdomen. Postoperative changes in the anterior abdominal wall consistent with mesh hernia repair. No residual or recurrent hernia is identified. Musculoskeletal: Mild lumbar scoliosis convex towards the right. Degenerative changes in the spine and hips. No destructive bone lesions. Review of the MIP images confirms the above findings. IMPRESSION: 1. No evidence of significant pulmonary embolus. 2. No evidence of active pulmonary disease. 3. No acute process demonstrated in the abdomen or pelvis. Electronically Signed   By: Lucienne Capers M.D.   On: 10/29/2020 21:36   CT Cervical Spine Wo Contrast  Result Date: 10/29/2020 CLINICAL DATA:  Fall from wheelchair, hit head, laceration to back of head, neck pain EXAM: CT HEAD WITHOUT CONTRAST CT CERVICAL SPINE WITHOUT CONTRAST TECHNIQUE: Multidetector CT imaging of the head and cervical spine was performed following the standard protocol without intravenous contrast. Multiplanar CT image reconstructions of the cervical spine were also generated. COMPARISON:  08/31/2020 FINDINGS: CT HEAD FINDINGS Brain: No evidence of acute infarction, hemorrhage, hydrocephalus, or extra-axial collection. There is a faintly rim hyperdense masslike lesion in the subinsular deep gray matter of the right hemisphere, in  the vicinity of the external capsule and lentiform nuclei, measuring approximately 2.6 x 1.3 x 1.9 cm (series 2, image 14, series 4, image 37). Mild periventricular white matter hypodensity. Vascular: No hyperdense vessel or unexpected calcification. Skull: Normal. Negative for fracture or focal lesion. Sinuses/Orbits: No acute finding. Other: Scalp contusion and laceration of the occiput (series 2, image 13). CT CERVICAL SPINE FINDINGS Alignment: Normal. Skull base and vertebrae: There is a nondisplaced fracture of the lateral left anterior arch of C1, extending into the left lateral mass and articular facets (series 2, image 30). No primary bone lesion or focal pathologic process. Soft tissues and spinal canal: No prevertebral fluid or swelling. No visible canal hematoma. Disc levels: Moderate disc space height loss and osteophytosis of the lower cervical levels. Upper chest: Negative. Other: None. IMPRESSION: 1. There is a faintly rim hyperdense masslike lesion in the subinsular deep gray matter of the right hemisphere, in the vicinity of the external capsule and lentiform nuclei, new compared to prior examination and highly suspicious for primary brain mass or metastasis. Recommend contrast enhanced MRI to further evaluate. 2. Nondisplaced acute fracture of the lateral left anterior arch of C1, extending into the left lateral mass and articular facets. No other fracture or static subluxation of the cervical spine. 3. Scalp contusion and laceration of the occiput. Electronically Signed   By: Eddie Candle M.D.   On: 10/29/2020 15:57  MR Brain W and Wo Contrast  Result Date: 10/29/2020 CLINICAL DATA:  Initial evaluation for brain mass or lesion, fall. EXAM: MRI HEAD WITHOUT AND WITH CONTRAST MRI CERVICAL SPINE WITHOUT AND WITH CONTRAST TECHNIQUE: Multiplanar, multiecho pulse sequences of the brain and surrounding structures, and cervical spine, to include the craniocervical junction and cervicothoracic  junction, were obtained without and with intravenous contrast. CONTRAST:  7.28mL GADAVIST GADOBUTROL 1 MMOL/ML IV SOLN COMPARISON:  Prior CTs from earlier the same day. FINDINGS: MRI HEAD FINDINGS Brain: Cerebral volume within normal limits for age. Patchy T2/FLAIR hyperintensity involving the periventricular and deep white matter both cerebral hemispheres noted, most consistent with chronic small vessel ischemic disease, mild in nature. Mild patchy involvement of the pons noted as well. There is a well-circumscribed lentiform lesion measuring 2.5 x 1.1 x 1.8 cm positioned at the right lentiform nucleus/external capsule (series 10, image 12). This corresponds with abnormality on prior CT. Lesion demonstrates hyperintense T1 and T2 signal intensity with a surrounding hemosiderin rim. Smooth rind of mild surrounding enhancement noted. Given appearance and position, finding favored to reflect an evolving subacute intraparenchymal hematoma, possibly reflecting an evolving hemorrhagic infarct. No definite underlying mass lesion is seen. Surrounding T2/FLAIR signal abnormality likely reflects associated vasogenic edema. No significant regional mass effect or midline shift. No visible intraventricular extension or other complication. No other evidence for acute or subacute ischemia. Gray-white matter differentiation otherwise maintained. No encephalomalacia to suggest chronic cortical infarction elsewhere within the brain. No other foci of susceptibility artifact to suggest acute or chronic intracranial hemorrhage. No other mass lesion or mass effect. No hydrocephalus or extra-axial fluid collection. Pituitary gland suprasellar region within normal limits. Midline structures intact and normal. No other abnormal enhancement. Vascular: Major intracranial vascular flow voids are maintained. Skull and upper cervical spine: Craniocervical junction within normal limits. Bone marrow signal intensity normal. No focal marrow  replacing lesion. Soft tissue contusion present at the left and central posterior parieto-occipital scalp. No frank soft tissue hematoma. Sinuses/Orbits: Patient status post bilateral ocular lens replacement with scleral buckling on the right. Globes and orbital soft tissues demonstrate no other acute finding. Scattered mucoperiosteal thickening noted within the ethmoidal air cells and maxillary sinuses. Paranasal sinuses are otherwise clear. Trace effusion noted within the right middle ear cavity. Mastoid air cells are largely clear. Visualized nasopharynx unremarkable. Other: None. MRI CERVICAL SPINE FINDINGS Alignment: Trace grade 1 anterolisthesis of C4 on C5, T1 on T2, T2 on T3, and T3 on T4. Findings chronic and facet mediated. Mild straightening of the normal cervical lordosis elsewhere. Vertebrae: Previously identified acute nondisplaced fracture extending through the left anterior arch and lateral mass of C1 noted, stable in position and alignment from prior CT. No other visible fracture evident by MRI. Vertebral body height maintained. Underlying bone marrow signal intensity within normal limits. No discrete or worrisome osseous lesions. Discogenic reactive endplate change with mild marrow edema noted about the partially visualized T3-4 interspace. Reactive marrow edema about the left C2-3 facet due to facet arthritis (series 8, image 12). No other abnormal marrow edema. Cord: Normal signal and morphology. No evidence for traumatic cord injury. Ligamentous structures appear intact. Posterior Fossa, vertebral arteries, paraspinal tissues: Craniocervical junction remains widely patent and within normal limits. Paraspinous and prevertebral soft tissues demonstrate no acute finding. Normal flow voids seen within the vertebral arteries bilaterally. Chronic fatty atrophy of both parotid glands noted. Disc levels: C2-C3: Minimal disc bulge. Left greater than right facet hypertrophy, with associated reactive  marrow edema  and small joint effusion on the left. No spinal stenosis. Foramina remain patent. C3-C4: Degenerative intervertebral disc space narrowing with diffuse disc bulge. Left greater than right facet hypertrophy. No spinal stenosis. Mild bilateral C4 foraminal narrowing. C4-C5: Anterolisthesis. Mild disc bulge with uncovertebral hypertrophy. Bilateral facet degeneration. No significant spinal stenosis. Moderate to severe bilateral C5 foraminal narrowing. C5-C6: Degenerative intervertebral disc space narrowing with diffuse disc osteophyte complex. Posterior component flattens the ventral thecal sac without significant spinal stenosis. Superimposed mild left-sided facet hypertrophy. Moderate to severe left with moderate right C6 foraminal narrowing. C6-C7: Degenerative intervertebral disc space narrowing with diffuse disc osteophyte complex. No significant spinal stenosis. Mild left C7 foraminal narrowing. Right neural foramina remains patent. C7-T1: Degenerative intervertebral disc space narrowing with diffuse disc osteophyte complex. Mild facet and ligament flavum hypertrophy. No significant spinal stenosis. Foramina remain patent. Visualized upper thoracic spine demonstrates mild multilevel noncompressive disc bulging without significant stenosis. IMPRESSION: MRI HEAD IMPRESSION: 1. 2.5 x 1.1 x 1.8 cm well-circumscribed lenticular lesion involving the right lentiform nucleus/external capsule, favored to reflect an evolving subacute intraparenchymal hematoma, possibly related to a subacute hemorrhagic infarct. Mild surrounding vasogenic edema without significant regional mass effect. While no definite underlying mass lesion is identified, a short interval follow-up MRI to ensure these changes resolve and no underlying lesion is present is recommended. 2. No other acute intracranial abnormality. 3. Soft tissue contusion at the left and central posterior parieto-occipital scalp. MRI CERVICAL SPINE IMPRESSION: 1.  Acute nondisplaced fracture extending through the left anterior arch and lateral mass of C1, stable in position and alignment from prior CT. 2. No other acute traumatic injury within the cervical spine. No evidence for acute cord injury or ligamentous disruption. 3. Multilevel cervical spondylosis without significant spinal stenosis. Moderate to severe bilateral C5 and C6 foraminal narrowing as above. Electronically Signed   By: Jeannine Boga M.D.   On: 10/29/2020 21:34   MR Cervical Spine Wo Contrast  Result Date: 10/29/2020 CLINICAL DATA:  Initial evaluation for brain mass or lesion, fall. EXAM: MRI HEAD WITHOUT AND WITH CONTRAST MRI CERVICAL SPINE WITHOUT AND WITH CONTRAST TECHNIQUE: Multiplanar, multiecho pulse sequences of the brain and surrounding structures, and cervical spine, to include the craniocervical junction and cervicothoracic junction, were obtained without and with intravenous contrast. CONTRAST:  7.51mL GADAVIST GADOBUTROL 1 MMOL/ML IV SOLN COMPARISON:  Prior CTs from earlier the same day. FINDINGS: MRI HEAD FINDINGS Brain: Cerebral volume within normal limits for age. Patchy T2/FLAIR hyperintensity involving the periventricular and deep white matter both cerebral hemispheres noted, most consistent with chronic small vessel ischemic disease, mild in nature. Mild patchy involvement of the pons noted as well. There is a well-circumscribed lentiform lesion measuring 2.5 x 1.1 x 1.8 cm positioned at the right lentiform nucleus/external capsule (series 10, image 12). This corresponds with abnormality on prior CT. Lesion demonstrates hyperintense T1 and T2 signal intensity with a surrounding hemosiderin rim. Smooth rind of mild surrounding enhancement noted. Given appearance and position, finding favored to reflect an evolving subacute intraparenchymal hematoma, possibly reflecting an evolving hemorrhagic infarct. No definite underlying mass lesion is seen. Surrounding T2/FLAIR signal  abnormality likely reflects associated vasogenic edema. No significant regional mass effect or midline shift. No visible intraventricular extension or other complication. No other evidence for acute or subacute ischemia. Gray-white matter differentiation otherwise maintained. No encephalomalacia to suggest chronic cortical infarction elsewhere within the brain. No other foci of susceptibility artifact to suggest acute or chronic intracranial hemorrhage. No other mass lesion or mass  effect. No hydrocephalus or extra-axial fluid collection. Pituitary gland suprasellar region within normal limits. Midline structures intact and normal. No other abnormal enhancement. Vascular: Major intracranial vascular flow voids are maintained. Skull and upper cervical spine: Craniocervical junction within normal limits. Bone marrow signal intensity normal. No focal marrow replacing lesion. Soft tissue contusion present at the left and central posterior parieto-occipital scalp. No frank soft tissue hematoma. Sinuses/Orbits: Patient status post bilateral ocular lens replacement with scleral buckling on the right. Globes and orbital soft tissues demonstrate no other acute finding. Scattered mucoperiosteal thickening noted within the ethmoidal air cells and maxillary sinuses. Paranasal sinuses are otherwise clear. Trace effusion noted within the right middle ear cavity. Mastoid air cells are largely clear. Visualized nasopharynx unremarkable. Other: None. MRI CERVICAL SPINE FINDINGS Alignment: Trace grade 1 anterolisthesis of C4 on C5, T1 on T2, T2 on T3, and T3 on T4. Findings chronic and facet mediated. Mild straightening of the normal cervical lordosis elsewhere. Vertebrae: Previously identified acute nondisplaced fracture extending through the left anterior arch and lateral mass of C1 noted, stable in position and alignment from prior CT. No other visible fracture evident by MRI. Vertebral body height maintained. Underlying bone  marrow signal intensity within normal limits. No discrete or worrisome osseous lesions. Discogenic reactive endplate change with mild marrow edema noted about the partially visualized T3-4 interspace. Reactive marrow edema about the left C2-3 facet due to facet arthritis (series 8, image 12). No other abnormal marrow edema. Cord: Normal signal and morphology. No evidence for traumatic cord injury. Ligamentous structures appear intact. Posterior Fossa, vertebral arteries, paraspinal tissues: Craniocervical junction remains widely patent and within normal limits. Paraspinous and prevertebral soft tissues demonstrate no acute finding. Normal flow voids seen within the vertebral arteries bilaterally. Chronic fatty atrophy of both parotid glands noted. Disc levels: C2-C3: Minimal disc bulge. Left greater than right facet hypertrophy, with associated reactive marrow edema and small joint effusion on the left. No spinal stenosis. Foramina remain patent. C3-C4: Degenerative intervertebral disc space narrowing with diffuse disc bulge. Left greater than right facet hypertrophy. No spinal stenosis. Mild bilateral C4 foraminal narrowing. C4-C5: Anterolisthesis. Mild disc bulge with uncovertebral hypertrophy. Bilateral facet degeneration. No significant spinal stenosis. Moderate to severe bilateral C5 foraminal narrowing. C5-C6: Degenerative intervertebral disc space narrowing with diffuse disc osteophyte complex. Posterior component flattens the ventral thecal sac without significant spinal stenosis. Superimposed mild left-sided facet hypertrophy. Moderate to severe left with moderate right C6 foraminal narrowing. C6-C7: Degenerative intervertebral disc space narrowing with diffuse disc osteophyte complex. No significant spinal stenosis. Mild left C7 foraminal narrowing. Right neural foramina remains patent. C7-T1: Degenerative intervertebral disc space narrowing with diffuse disc osteophyte complex. Mild facet and ligament  flavum hypertrophy. No significant spinal stenosis. Foramina remain patent. Visualized upper thoracic spine demonstrates mild multilevel noncompressive disc bulging without significant stenosis. IMPRESSION: MRI HEAD IMPRESSION: 1. 2.5 x 1.1 x 1.8 cm well-circumscribed lenticular lesion involving the right lentiform nucleus/external capsule, favored to reflect an evolving subacute intraparenchymal hematoma, possibly related to a subacute hemorrhagic infarct. Mild surrounding vasogenic edema without significant regional mass effect. While no definite underlying mass lesion is identified, a short interval follow-up MRI to ensure these changes resolve and no underlying lesion is present is recommended. 2. No other acute intracranial abnormality. 3. Soft tissue contusion at the left and central posterior parieto-occipital scalp. MRI CERVICAL SPINE IMPRESSION: 1. Acute nondisplaced fracture extending through the left anterior arch and lateral mass of C1, stable in position and alignment from prior CT.  2. No other acute traumatic injury within the cervical spine. No evidence for acute cord injury or ligamentous disruption. 3. Multilevel cervical spondylosis without significant spinal stenosis. Moderate to severe bilateral C5 and C6 foraminal narrowing as above. Electronically Signed   By: Jeannine Boga M.D.   On: 10/29/2020 21:34   CT ABDOMEN PELVIS W CONTRAST  Result Date: 10/29/2020 CLINICAL DATA:  Pulmonary embolus suspected with high probability. Recent fall. Chronic back and right hip pain. EXAM: CT ANGIOGRAPHY CHEST CT ABDOMEN AND PELVIS WITH CONTRAST TECHNIQUE: Multidetector CT imaging of the chest was performed using the standard protocol during bolus administration of intravenous contrast. Multiplanar CT image reconstructions and MIPs were obtained to evaluate the vascular anatomy. Multidetector CT imaging of the abdomen and pelvis was performed using the standard protocol during bolus administration of  intravenous contrast. CONTRAST:  44mL OMNIPAQUE IOHEXOL 350 MG/ML SOLN COMPARISON:  CT abdomen and pelvis 10/26/2020 FINDINGS: CTA CHEST FINDINGS Cardiovascular: Good opacification of the central and segmental pulmonary arteries. No focal filling defects. No evidence of significant pulmonary embolus. Cardiac enlargement. No pericardial effusions. Mild coronary artery and aortic calcifications. No aortic dissection. Great vessel origins are patent. Mediastinum/Nodes: Esophagus is decompressed. No significant lymphadenopathy in the chest. Thyroid gland is unremarkable. Lungs/Pleura: Mild dependent changes in the lung bases. No airspace disease or consolidation. No pleural effusions. No pneumothorax. Musculoskeletal: Degenerative changes in the spine and shoulders. No destructive bone lesions identified. Review of the MIP images confirms the above findings. CT ABDOMEN and PELVIS FINDINGS Hepatobiliary: Mild diffuse fatty infiltration of the liver. No focal lesions identified. Surgical absence of the gallbladder. No bile duct dilatation. Pancreas: Unremarkable. No pancreatic ductal dilatation or surrounding inflammatory changes. Spleen: Normal in size without focal abnormality. Adrenals/Urinary Tract: Adrenal glands are unremarkable. Kidneys are normal, without renal calculi, focal lesion, or hydronephrosis. Bladder is unremarkable. Stomach/Bowel: Stomach, small bowel, and colon are not abnormally distended. Prominent submucosal fat in the right colon is likely related to obesity. Diverticulosis of the sigmoid colon. Stranding has improved since prior study without significant residual inflammatory change. Appendix is normal. Vascular/Lymphatic: Aortic atherosclerosis. No enlarged abdominal or pelvic lymph nodes. Reproductive: Status post hysterectomy. No adnexal masses. Other: No free air or free fluid in the abdomen. Postoperative changes in the anterior abdominal wall consistent with mesh hernia repair. No residual  or recurrent hernia is identified. Musculoskeletal: Mild lumbar scoliosis convex towards the right. Degenerative changes in the spine and hips. No destructive bone lesions. Review of the MIP images confirms the above findings. IMPRESSION: 1. No evidence of significant pulmonary embolus. 2. No evidence of active pulmonary disease. 3. No acute process demonstrated in the abdomen or pelvis. Electronically Signed   By: Lucienne Capers M.D.   On: 10/29/2020 21:36   CT T-SPINE NO CHARGE  Result Date: 10/29/2020 CLINICAL DATA:  Patient fell, striking head. Chronic back pain and right hip pain. EXAM: CT Thoracic and Lumbar spine with contrast TECHNIQUE: Multiplanar CT images of the thoracic and lumbar spine were reconstructed from contemporary CT of the Chest, Abdomen, and Pelvis CONTRAST:  No additional COMPARISON:  CT abdomen and pelvis and MRI lumbar spine 10/26/2020 FINDINGS: CT THORACIC SPINE FINDINGS Alignment: Mild thoracic scoliosis, convex towards the right. No anterior subluxations. Vertebrae: No vertebral compression deformities. No focal bone lesion or bone destruction. Bone cortex appears intact. Paraspinal and other soft tissues: No paraspinal soft tissue infiltration or mass. Disc levels: Degenerative changes throughout the thoracic spine with narrowed interspaces and endplate osteophyte formation. Bridging  anterior osteophytes throughout. No evidence of severe bony encroachment upon the central canal. CT LUMBAR SPINE FINDINGS Segmentation: 5 lumbar type vertebrae. Alignment: Mild lumbar scoliosis convex towards the left. No anterior subluxations. Vertebrae: Schmorl's node at the inferior endplate of L2, unchanged. No acute vertebral compression deformities. No focal bone lesions or bone destruction. Bone cortex appears intact. Paraspinal and other soft tissues: Paraspinal soft tissues appear intact. No evidence of paraspinal mass or soft tissue infiltration. Disc levels: Degenerative changes throughout  the lumbar spine with narrowed interspaces and endplate osteophyte formation. Degenerative changes in the facet joints. Moderate central canal stenosis is suggested at L3-4 and L4-5 levels, caused by combination of osteophyte formation and disc bulging. IMPRESSION: 1. Mild thoracolumbar scoliosis with thoracic spine convex towards the right and lumbar spine towards the left. 2. No anterior subluxations. 3. No acute fractures or dislocation identified. 4. Diffuse degenerative changes. Electronically Signed   By: Lucienne Capers M.D.   On: 10/29/2020 21:45   CT L-SPINE NO CHARGE  Result Date: 10/29/2020 CLINICAL DATA:  Patient fell, striking head. Chronic back pain and right hip pain. EXAM: CT Thoracic and Lumbar spine with contrast TECHNIQUE: Multiplanar CT images of the thoracic and lumbar spine were reconstructed from contemporary CT of the Chest, Abdomen, and Pelvis CONTRAST:  No additional COMPARISON:  CT abdomen and pelvis and MRI lumbar spine 10/26/2020 FINDINGS: CT THORACIC SPINE FINDINGS Alignment: Mild thoracic scoliosis, convex towards the right. No anterior subluxations. Vertebrae: No vertebral compression deformities. No focal bone lesion or bone destruction. Bone cortex appears intact. Paraspinal and other soft tissues: No paraspinal soft tissue infiltration or mass. Disc levels: Degenerative changes throughout the thoracic spine with narrowed interspaces and endplate osteophyte formation. Bridging anterior osteophytes throughout. No evidence of severe bony encroachment upon the central canal. CT LUMBAR SPINE FINDINGS Segmentation: 5 lumbar type vertebrae. Alignment: Mild lumbar scoliosis convex towards the left. No anterior subluxations. Vertebrae: Schmorl's node at the inferior endplate of L2, unchanged. No acute vertebral compression deformities. No focal bone lesions or bone destruction. Bone cortex appears intact. Paraspinal and other soft tissues: Paraspinal soft tissues appear intact. No  evidence of paraspinal mass or soft tissue infiltration. Disc levels: Degenerative changes throughout the lumbar spine with narrowed interspaces and endplate osteophyte formation. Degenerative changes in the facet joints. Moderate central canal stenosis is suggested at L3-4 and L4-5 levels, caused by combination of osteophyte formation and disc bulging. IMPRESSION: 1. Mild thoracolumbar scoliosis with thoracic spine convex towards the right and lumbar spine towards the left. 2. No anterior subluxations. 3. No acute fractures or dislocation identified. 4. Diffuse degenerative changes. Electronically Signed   By: Lucienne Capers M.D.   On: 10/29/2020 21:45     Assessment: 80 year old female presenting after a fall striking the back of her head. Imaging reveals a right basal ganglia hemorrhage.  1. Exam reveals minimal right sided weakness, which is unexpected given the size of the right basal ganglia hemorrhage seen on MRI. Also unexpected are left sided sensory deficits, which generally do not occur with isolated basal ganglia lesions.  2. MRI brain:  2.5 x 1.1 x 1.8 cm well-circumscribed lenticular lesion involving the right lentiform nucleus/external capsule, favored to reflect an evolving subacute intraparenchymal hematoma, possibly related to a subacute hemorrhagic infarct. Mild surrounding vasogenic edema without significant regional mass effect.  3. Of note, the hemorrhage seen on MRI appears likely to be several days if not over one week old. It does not appear likely to be  secondary to trauma. It is most likely a hypertensive bleed.  4. MRI of cervical spine: Acute nondisplaced fracture extending through the left anterior arch and lateral mass of C1, stable in position and alignment from prior CT. 5. She does have a family history of brain cancer (daughter); however, the lesion seen on MRI brain appears more consistent with a hypertensive hemorrhage or diffuse petechial hemorrhagic conversion of  an ischemic stroke.  6. Has had recent falls prior to the most recent fall backwards in wheelchair. She is a poor historian and cannot recall if syncope was involved. However, given her low BP in the ED, syncope secondary to orthostatic hypotension should be considered as a possible etiology.  7. EKG (9/17): Sinus rhythm with Premature atrial complexes; Left axis deviation  Recommendations: 1. While no definite underlying mass lesion is identified on the MRI obtained this admission, a short interval follow-up MRI is recommended to ensure these changes resolve and no underlying lesion is present. Would obtain repeat MRI brain with contrast in 2-3 months.  2. Neurosurgery to provide recommendations regarding operative versus non-operative management of the patient's C1 fracture.  3. Discontinue ASA 4. TTE is pending to rule out cardiac vegetation or mural thrombus (DDx of stroke followed by petechial hemorrhagic conversion).  5. Cardiac telemetry to assess for possible intermittent a-fib 6. CTA of head and neck 7. PT/OT/Speech 8. BP management    Electronically signed: Dr. Kerney Elbe 10/30/2020, 8:20 AM

## 2020-10-30 NOTE — ED Notes (Signed)
Reports given to next shift RN

## 2020-10-30 NOTE — ED Notes (Signed)
Patient repositioned in bed and placed in hospital gown.

## 2020-10-30 NOTE — Progress Notes (Signed)
PROGRESS NOTE    [name]  ZMO:[id number] DOB: 02/15/1940 DOA: 10/29/2020 PCP: Venia Carbon, MD   Assessment & Plan:   Active Problems:   Essential hypertension, benign   GERD   Fibromyalgia   Osteoarthritis, knee   Type 2 diabetes, uncontrolled, with neuropathy (HCC)   Iron deficiency anemia   Stage 3b chronic kidney disease (HCC)   Hemorrhagic stroke (HCC)   Possible subacute hemorrhagic infarct: MRI brain shows 2.5 x 1.1 x 1.8 cm well-circumscribed lenticular lesion involving the right lentiform nucleus/external capsule, favored to reflect an evolving subacute intraparenchymal hematoma, possibly related to a subacute hemorrhagic infarct. Mild surrounding vasogenic edema without significant regional mass effect.  Neuro surg & neruo consulted. Continue w/ neuro checks. Echo pending.   C1 fracture: likely secondary to recent fall. Continue w/ C collar. Neuro surg consulted. Unable to do carotid US at this time as C collar cannot be removed currently to manipulate neck for test.    S/p fall and back pain: head/neck CT and MRI see above. Scalp laceration no need suture. C1 fracture on c collar, f/u w/ Dr. Lacinda Axon. Chest CTA and A/P  negative. Lumbar spine MRI on 9/18 showed degenerative changes.    Diarrhea: h/o taking augmentin for diverticulitis. GI PCR panel and c. diff are both neg.   Hypokalemia: KCl repleated. Will continue to monitor    HTN: recently not take meds. Continue on cardura, losartan    DM2: likely poorly controlled. Continue on SSI w/ accuchecks  Normocytic anemia: H&H are labile   CKDIIIb: Cr is trending down from day prior. Avoid nephrotoxic meds   Fibromyalgia and chronic pain: continue on home dose of efffexor   DVT prophylaxis: SCDs secondary to above Code Status: full  Family Communication:  Disposition Plan: depends on PT/OT recs  Level of care: Stepdown  Status is: Inpatient  Remains inpatient appropriate because:Unsafe d/c plan, IV  treatments appropriate due to intensity of illness or inability to take PO, and Inpatient level of care appropriate due to severity of illness  Dispo: The patient is from: Home              Anticipated d/c is to: unclear              Patient currently is not medically stable to d/c.   Difficult to place patient : unclear      Consultants:  Neuro Neuro surg   Procedures:   Antimicrobials:   Subjective: Pt c/o nausea & vomiting   Objective: Vitals:   10/30/20 0600 10/30/20 0630 10/30/20 0700 10/30/20 0730  BP: 130/65 138/74 125/64 (!) 92/57  Pulse: 67 69 74 63  Resp: 13 10 15 12   Temp:      TempSrc:      SpO2: 93% 97% 96% 94%  Weight:      Height:       No intake or output data in the 24 hours ending 10/30/20 0829 Filed Weights   10/29/20 1447  Weight: 79.4 kg    Examination:  General exam: Appears calm and comfortable  Respiratory system: Clear to auscultation. Respiratory effort normal. Cardiovascular system: S1 & S2 +. No  rubs, gallops or clicks.  Gastrointestinal system: Abdomen is nondistended, soft and nontender. Hyperactive bowel sounds heard. Central nervous system: Alert and oriented. Moves all extremities  Psychiatry: Judgement and insight appear normal. Flat mood and affect    Data Reviewed: I have personally reviewed following labs and imaging studies  CBC: Recent Labs  Lab 10/24/20 1855 10/26/20 1612 10/29/20 1947 10/30/20 0503  WBC 6.0 7.8 8.4 6.3  NEUTROABS  --   --  6.5  --   HGB 12.0 11.9* 11.9* 10.6*  HCT 34.2* 34.5* 35.0* 31.1*  MCV 94.0 94.8 94.3 92.8  PLT 219 244 234 801   Basic Metabolic Panel: Recent Labs  Lab 10/24/20 1855 10/26/20 1612 10/26/20 1912 10/29/20 1947 10/30/20 0503  NA 134* 136  --  137 137  K 3.1* 3.5  --  3.7 3.2*  CL 98 100  --  101 104  CO2 27 23  --  24 22  GLUCOSE 260* 243*  --  198* 170*  BUN 12 14  --  16 14  CREATININE 1.03* 1.15*  --  1.24* 1.06*  CALCIUM 8.9 9.0  --  9.0 7.9*  MG 1.8  --   1.8  --  1.9   GFR: Estimated Creatinine Clearance: 43.9 mL/min (A) (by C-G formula based on SCr of 1.06 mg/dL (H)). Liver Function Tests: Recent Labs  Lab 10/24/20 1855 10/26/20 1621  AST 41 35  ALT 25 26  ALKPHOS 66 73  BILITOT 1.2 1.7*  PROT 7.3 7.3  ALBUMIN 3.2* 3.3*   Recent Labs  Lab 10/24/20 1855  LIPASE 21   No results for input(s): AMMONIA in the last 168 hours. Coagulation Profile: No results for input(s): INR, PROTIME in the last 168 hours. Cardiac Enzymes: No results for input(s): CKTOTAL, CKMB, CKMBINDEX, TROPONINI in the last 168 hours. BNP (last 3 results) No results for input(s): PROBNP in the last 8760 hours. HbA1C: No results for input(s): HGBA1C in the last 72 hours. CBG: Recent Labs  Lab 10/24/20 2312 10/26/20 1812  GLUCAP 198* 236*   Lipid Profile: Recent Labs    10/30/20 0503  CHOL 105  HDL 36*  LDLCALC 46  TRIG 116  CHOLHDL 2.9   Thyroid Function Tests: No results for input(s): TSH, T4TOTAL, FREET4, T3FREE, THYROIDAB in the last 72 hours. Anemia Panel: No results for input(s): VITAMINB12, FOLATE, FERRITIN, TIBC, IRON, RETICCTPCT in the last 72 hours. Sepsis Labs: No results for input(s): PROCALCITON, LATICACIDVEN in the last 168 hours.  Recent Results (from the past 240 hour(s))  Resp Panel by RT-PCR (Flu A&B, Covid) Nasopharyngeal Swab     Status: None   Collection Time: 10/29/20 11:31 PM   Specimen: Nasopharyngeal Swab; Nasopharyngeal(NP) swabs in vial transport medium  Result Value Ref Range Status   SARS Coronavirus 2 by RT PCR NEGATIVE NEGATIVE Final    Comment: (NOTE) SARS-CoV-2 target nucleic acids are NOT DETECTED.  The SARS-CoV-2 RNA is generally detectable in upper respiratory specimens during the acute phase of infection. The lowest concentration of SARS-CoV-2 viral copies this assay can detect is 138 copies/mL. A negative result does not preclude SARS-Cov-2 infection and should not be used as the sole basis for  treatment or other patient management decisions. A negative result may occur with  improper specimen collection/handling, submission of specimen other than nasopharyngeal swab, presence of viral mutation(s) within the areas targeted by this assay, and inadequate number of viral copies(<138 copies/mL). A negative result must be combined with clinical observations, patient history, and epidemiological information. The expected result is Negative.  Fact Sheet for Patients:  EntrepreneurPulse.com.au  Fact Sheet for Healthcare Providers:  IncredibleEmployment.be  This test is no t yet approved or cleared by the [place] FDA and  has been authorized for detection and/or diagnosis of SARS-CoV-2 by FDA under an Emergency Use  Authorization (EUA). This EUA will remain  in effect (meaning this test can be used) for the duration of the COVID-19 declaration under Section 564(b)(1) of the Act, 21 U.S.C.section 360bbb-3(b)(1), unless the authorization is terminated  or revoked sooner.       Influenza A by PCR NEGATIVE NEGATIVE Final   Influenza B by PCR NEGATIVE NEGATIVE Final    Comment: (NOTE) The Xpert Xpress SARS-CoV-2/FLU/RSV plus assay is intended as an aid in the diagnosis of influenza from Nasopharyngeal swab specimens and should not be used as a sole basis for treatment. Nasal washings and aspirates are unacceptable for Xpert Xpress SARS-CoV-2/FLU/RSV testing.  Fact Sheet for Patients: EntrepreneurPulse.com.au  Fact Sheet for Healthcare Providers: IncredibleEmployment.be  This test is not yet approved or cleared by the [place] FDA and has been authorized for detection and/or diagnosis of SARS-CoV-2 by FDA under an Emergency Use Authorization (EUA). This EUA will remain in effect (meaning this test can be used) for the duration of the COVID-19 declaration under Section 564(b)(1) of the Act, 21  U.S.C. section 360bbb-3(b)(1), unless the authorization is terminated or revoked.  Performed at [facility], [address] [place], [place] [address]          Radiology Studies: CT HEAD WO CONTRAST (5MM)  Result Date: 10/29/2020 CLINICAL DATA:  Fall from wheelchair, hit head, laceration to back of head, neck pain EXAM: CT HEAD WITHOUT CONTRAST CT CERVICAL SPINE WITHOUT CONTRAST TECHNIQUE: Multidetector CT imaging of the head and cervical spine was performed following the standard protocol without intravenous contrast. Multiplanar CT image reconstructions of the cervical spine were also generated. COMPARISON:  08/31/2020 FINDINGS: CT HEAD FINDINGS Brain: No evidence of acute infarction, hemorrhage, hydrocephalus, or extra-axial collection. There is a faintly rim hyperdense masslike lesion in the subinsular deep gray matter of the right hemisphere, in the vicinity of the external capsule and lentiform nuclei, measuring approximately 2.6 x 1.3 x 1.9 cm (series 2, image 14, series 4, image 37). Mild periventricular white matter hypodensity. Vascular: No hyperdense vessel or unexpected calcification. Skull: Normal. Negative for fracture or focal lesion. Sinuses/Orbits: No acute finding. Other: Scalp contusion and laceration of the occiput (series 2, image 13). CT CERVICAL SPINE FINDINGS Alignment: Normal. Skull base and vertebrae: There is a nondisplaced fracture of the lateral left anterior arch of C1, extending into the left lateral mass and articular facets (series 2, image 30). No primary bone lesion or focal pathologic process. Soft tissues and spinal canal: No prevertebral fluid or swelling. No visible canal hematoma. Disc levels: Moderate disc space height loss and osteophytosis of the lower cervical levels. Upper chest: Negative. Other: None. IMPRESSION: 1. There is a faintly rim hyperdense masslike lesion in the subinsular deep gray matter of the right hemisphere, in the vicinity of  the external capsule and lentiform nuclei, new compared to prior examination and highly suspicious for primary brain mass or metastasis. Recommend contrast enhanced MRI to further evaluate. 2. Nondisplaced acute fracture of the lateral left anterior arch of C1, extending into the left lateral mass and articular facets. No other fracture or static subluxation of the cervical spine. 3. Scalp contusion and laceration of the occiput. Electronically Signed   By: Eddie Candle M.D.   On: 10/29/2020 15:57   CT Angio Chest PE W and/or Wo Contrast  Result Date: 10/29/2020 CLINICAL DATA:  Pulmonary embolus suspected with high probability. Recent fall. Chronic back and right hip pain. EXAM: CT ANGIOGRAPHY CHEST CT ABDOMEN AND PELVIS WITH CONTRAST  TECHNIQUE: Multidetector CT imaging of the chest was performed using the standard protocol during bolus administration of intravenous contrast. Multiplanar CT image reconstructions and MIPs were obtained to evaluate the vascular anatomy. Multidetector CT imaging of the abdomen and pelvis was performed using the standard protocol during bolus administration of intravenous contrast. CONTRAST:  65mL OMNIPAQUE IOHEXOL 350 MG/ML SOLN COMPARISON:  CT abdomen and pelvis 10/26/2020 FINDINGS: CTA CHEST FINDINGS Cardiovascular: Good opacification of the central and segmental pulmonary arteries. No focal filling defects. No evidence of significant pulmonary embolus. Cardiac enlargement. No pericardial effusions. Mild coronary artery and aortic calcifications. No aortic dissection. Great vessel origins are patent. Mediastinum/Nodes: Esophagus is decompressed. No significant lymphadenopathy in the chest. Thyroid gland is unremarkable. Lungs/Pleura: Mild dependent changes in the lung bases. No airspace disease or consolidation. No pleural effusions. No pneumothorax. Musculoskeletal: Degenerative changes in the spine and shoulders. No destructive bone lesions identified. Review of the MIP images  confirms the above findings. CT ABDOMEN and PELVIS FINDINGS Hepatobiliary: Mild diffuse fatty infiltration of the liver. No focal lesions identified. Surgical absence of the gallbladder. No bile duct dilatation. Pancreas: Unremarkable. No pancreatic ductal dilatation or surrounding inflammatory changes. Spleen: Normal in size without focal abnormality. Adrenals/Urinary Tract: Adrenal glands are unremarkable. Kidneys are normal, without renal calculi, focal lesion, or hydronephrosis. Bladder is unremarkable. Stomach/Bowel: Stomach, small bowel, and colon are not abnormally distended. Prominent submucosal fat in the right colon is likely related to obesity. Diverticulosis of the sigmoid colon. Stranding has improved since prior study without significant residual inflammatory change. Appendix is normal. Vascular/Lymphatic: Aortic atherosclerosis. No enlarged abdominal or pelvic lymph nodes. Reproductive: Status post hysterectomy. No adnexal masses. Other: No free air or free fluid in the abdomen. Postoperative changes in the anterior abdominal wall consistent with mesh hernia repair. No residual or recurrent hernia is identified. Musculoskeletal: Mild lumbar scoliosis convex towards the right. Degenerative changes in the spine and hips. No destructive bone lesions. Review of the MIP images confirms the above findings. IMPRESSION: 1. No evidence of significant pulmonary embolus. 2. No evidence of active pulmonary disease. 3. No acute process demonstrated in the abdomen or pelvis. Electronically Signed   By: Lucienne Capers M.D.   On: 10/29/2020 21:36   CT Cervical Spine Wo Contrast  Result Date: 10/29/2020 CLINICAL DATA:  Fall from wheelchair, hit head, laceration to back of head, neck pain EXAM: CT HEAD WITHOUT CONTRAST CT CERVICAL SPINE WITHOUT CONTRAST TECHNIQUE: Multidetector CT imaging of the head and cervical spine was performed following the standard protocol without intravenous contrast. Multiplanar CT  image reconstructions of the cervical spine were also generated. COMPARISON:  08/31/2020 FINDINGS: CT HEAD FINDINGS Brain: No evidence of acute infarction, hemorrhage, hydrocephalus, or extra-axial collection. There is a faintly rim hyperdense masslike lesion in the subinsular deep gray matter of the right hemisphere, in the vicinity of the external capsule and lentiform nuclei, measuring approximately 2.6 x 1.3 x 1.9 cm (series 2, image 14, series 4, image 37). Mild periventricular white matter hypodensity. Vascular: No hyperdense vessel or unexpected calcification. Skull: Normal. Negative for fracture or focal lesion. Sinuses/Orbits: No acute finding. Other: Scalp contusion and laceration of the occiput (series 2, image 13). CT CERVICAL SPINE FINDINGS Alignment: Normal. Skull base and vertebrae: There is a nondisplaced fracture of the lateral left anterior arch of C1, extending into the left lateral mass and articular facets (series 2, image 30). No primary bone lesion or focal pathologic process. Soft tissues and spinal canal: No prevertebral fluid or swelling.  No visible canal hematoma. Disc levels: Moderate disc space height loss and osteophytosis of the lower cervical levels. Upper chest: Negative. Other: None. IMPRESSION: 1. There is a faintly rim hyperdense masslike lesion in the subinsular deep gray matter of the right hemisphere, in the vicinity of the external capsule and lentiform nuclei, new compared to prior examination and highly suspicious for primary brain mass or metastasis. Recommend contrast enhanced MRI to further evaluate. 2. Nondisplaced acute fracture of the lateral left anterior arch of C1, extending into the left lateral mass and articular facets. No other fracture or static subluxation of the cervical spine. 3. Scalp contusion and laceration of the occiput. Electronically Signed   By: Eddie Candle M.D.   On: 10/29/2020 15:57   MR Brain W and Wo Contrast  Result Date:  10/29/2020 CLINICAL DATA:  Initial evaluation for brain mass or lesion, fall. EXAM: MRI HEAD WITHOUT AND WITH CONTRAST MRI CERVICAL SPINE WITHOUT AND WITH CONTRAST TECHNIQUE: Multiplanar, multiecho pulse sequences of the brain and surrounding structures, and cervical spine, to include the craniocervical junction and cervicothoracic junction, were obtained without and with intravenous contrast. CONTRAST:  7.77mL GADAVIST GADOBUTROL 1 MMOL/ML IV SOLN COMPARISON:  Prior CTs from earlier the same day. FINDINGS: MRI HEAD FINDINGS Brain: Cerebral volume within normal limits for age. Patchy T2/FLAIR hyperintensity involving the periventricular and deep white matter both cerebral hemispheres noted, most consistent with chronic small vessel ischemic disease, mild in nature. Mild patchy involvement of the pons noted as well. There is a well-circumscribed lentiform lesion measuring 2.5 x 1.1 x 1.8 cm positioned at the right lentiform nucleus/external capsule (series 10, image 12). This corresponds with abnormality on prior CT. Lesion demonstrates hyperintense T1 and T2 signal intensity with a surrounding hemosiderin rim. Smooth rind of mild surrounding enhancement noted. Given appearance and position, finding favored to reflect an evolving subacute intraparenchymal hematoma, possibly reflecting an evolving hemorrhagic infarct. No definite underlying mass lesion is seen. Surrounding T2/FLAIR signal abnormality likely reflects associated vasogenic edema. No significant regional mass effect or midline shift. No visible intraventricular extension or other complication. No other evidence for acute or subacute ischemia. Gray-white matter differentiation otherwise maintained. No encephalomalacia to suggest chronic cortical infarction elsewhere within the brain. No other foci of susceptibility artifact to suggest acute or chronic intracranial hemorrhage. No other mass lesion or mass effect. No hydrocephalus or extra-axial fluid  collection. Pituitary gland suprasellar region within normal limits. Midline structures intact and normal. No other abnormal enhancement. Vascular: Major intracranial vascular flow voids are maintained. Skull and upper cervical spine: Craniocervical junction within normal limits. Bone marrow signal intensity normal. No focal marrow replacing lesion. Soft tissue contusion present at the left and central posterior parieto-occipital scalp. No frank soft tissue hematoma. Sinuses/Orbits: Patient status post bilateral ocular lens replacement with scleral buckling on the right. Globes and orbital soft tissues demonstrate no other acute finding. Scattered mucoperiosteal thickening noted within the ethmoidal air cells and maxillary sinuses. Paranasal sinuses are otherwise clear. Trace effusion noted within the right middle ear cavity. Mastoid air cells are largely clear. Visualized nasopharynx unremarkable. Other: None. MRI CERVICAL SPINE FINDINGS Alignment: Trace grade 1 anterolisthesis of C4 on C5, T1 on T2, T2 on T3, and T3 on T4. Findings chronic and facet mediated. Mild straightening of the normal cervical lordosis elsewhere. Vertebrae: Previously identified acute nondisplaced fracture extending through the left anterior arch and lateral mass of C1 noted, stable in position and alignment from prior CT. No other visible fracture evident by  MRI. Vertebral body height maintained. Underlying bone marrow signal intensity within normal limits. No discrete or worrisome osseous lesions. Discogenic reactive endplate change with mild marrow edema noted about the partially visualized T3-4 interspace. Reactive marrow edema about the left C2-3 facet due to facet arthritis (series 8, image 12). No other abnormal marrow edema. Cord: Normal signal and morphology. No evidence for traumatic cord injury. Ligamentous structures appear intact. Posterior Fossa, vertebral arteries, paraspinal tissues: Craniocervical junction remains widely  patent and within normal limits. Paraspinous and prevertebral soft tissues demonstrate no acute finding. Normal flow voids seen within the vertebral arteries bilaterally. Chronic fatty atrophy of both parotid glands noted. Disc levels: C2-C3: Minimal disc bulge. Left greater than right facet hypertrophy, with associated reactive marrow edema and small joint effusion on the left. No spinal stenosis. Foramina remain patent. C3-C4: Degenerative intervertebral disc space narrowing with diffuse disc bulge. Left greater than right facet hypertrophy. No spinal stenosis. Mild bilateral C4 foraminal narrowing. C4-C5: Anterolisthesis. Mild disc bulge with uncovertebral hypertrophy. Bilateral facet degeneration. No significant spinal stenosis. Moderate to severe bilateral C5 foraminal narrowing. C5-C6: Degenerative intervertebral disc space narrowing with diffuse disc osteophyte complex. Posterior component flattens the ventral thecal sac without significant spinal stenosis. Superimposed mild left-sided facet hypertrophy. Moderate to severe left with moderate right C6 foraminal narrowing. C6-C7: Degenerative intervertebral disc space narrowing with diffuse disc osteophyte complex. No significant spinal stenosis. Mild left C7 foraminal narrowing. Right neural foramina remains patent. C7-T1: Degenerative intervertebral disc space narrowing with diffuse disc osteophyte complex. Mild facet and ligament flavum hypertrophy. No significant spinal stenosis. Foramina remain patent. Visualized upper thoracic spine demonstrates mild multilevel noncompressive disc bulging without significant stenosis. IMPRESSION: MRI HEAD IMPRESSION: 1. 2.5 x 1.1 x 1.8 cm well-circumscribed lenticular lesion involving the right lentiform nucleus/external capsule, favored to reflect an evolving subacute intraparenchymal hematoma, possibly related to a subacute hemorrhagic infarct. Mild surrounding vasogenic edema without significant regional mass effect.  While no definite underlying mass lesion is identified, a short interval follow-up MRI to ensure these changes resolve and no underlying lesion is present is recommended. 2. No other acute intracranial abnormality. 3. Soft tissue contusion at the left and central posterior parieto-occipital scalp. MRI CERVICAL SPINE IMPRESSION: 1. Acute nondisplaced fracture extending through the left anterior arch and lateral mass of C1, stable in position and alignment from prior CT. 2. No other acute traumatic injury within the cervical spine. No evidence for acute cord injury or ligamentous disruption. 3. Multilevel cervical spondylosis without significant spinal stenosis. Moderate to severe bilateral C5 and C6 foraminal narrowing as above. Electronically Signed   By: Jeannine Boga M.D.   On: 10/29/2020 21:34   MR Cervical Spine Wo Contrast  Result Date: 10/29/2020 CLINICAL DATA:  Initial evaluation for brain mass or lesion, fall. EXAM: MRI HEAD WITHOUT AND WITH CONTRAST MRI CERVICAL SPINE WITHOUT AND WITH CONTRAST TECHNIQUE: Multiplanar, multiecho pulse sequences of the brain and surrounding structures, and cervical spine, to include the craniocervical junction and cervicothoracic junction, were obtained without and with intravenous contrast. CONTRAST:  7.26mL GADAVIST GADOBUTROL 1 MMOL/ML IV SOLN COMPARISON:  Prior CTs from earlier the same day. FINDINGS: MRI HEAD FINDINGS Brain: Cerebral volume within normal limits for age. Patchy T2/FLAIR hyperintensity involving the periventricular and deep white matter both cerebral hemispheres noted, most consistent with chronic small vessel ischemic disease, mild in nature. Mild patchy involvement of the pons noted as well. There is a well-circumscribed lentiform lesion measuring 2.5 x 1.1 x 1.8 cm positioned at the  right lentiform nucleus/external capsule (series 10, image 12). This corresponds with abnormality on prior CT. Lesion demonstrates hyperintense T1 and T2 signal  intensity with a surrounding hemosiderin rim. Smooth rind of mild surrounding enhancement noted. Given appearance and position, finding favored to reflect an evolving subacute intraparenchymal hematoma, possibly reflecting an evolving hemorrhagic infarct. No definite underlying mass lesion is seen. Surrounding T2/FLAIR signal abnormality likely reflects associated vasogenic edema. No significant regional mass effect or midline shift. No visible intraventricular extension or other complication. No other evidence for acute or subacute ischemia. Gray-white matter differentiation otherwise maintained. No encephalomalacia to suggest chronic cortical infarction elsewhere within the brain. No other foci of susceptibility artifact to suggest acute or chronic intracranial hemorrhage. No other mass lesion or mass effect. No hydrocephalus or extra-axial fluid collection. Pituitary gland suprasellar region within normal limits. Midline structures intact and normal. No other abnormal enhancement. Vascular: Major intracranial vascular flow voids are maintained. Skull and upper cervical spine: Craniocervical junction within normal limits. Bone marrow signal intensity normal. No focal marrow replacing lesion. Soft tissue contusion present at the left and central posterior parieto-occipital scalp. No frank soft tissue hematoma. Sinuses/Orbits: Patient status post bilateral ocular lens replacement with scleral buckling on the right. Globes and orbital soft tissues demonstrate no other acute finding. Scattered mucoperiosteal thickening noted within the ethmoidal air cells and maxillary sinuses. Paranasal sinuses are otherwise clear. Trace effusion noted within the right middle ear cavity. Mastoid air cells are largely clear. Visualized nasopharynx unremarkable. Other: None. MRI CERVICAL SPINE FINDINGS Alignment: Trace grade 1 anterolisthesis of C4 on C5, T1 on T2, T2 on T3, and T3 on T4. Findings chronic and facet mediated. Mild  straightening of the normal cervical lordosis elsewhere. Vertebrae: Previously identified acute nondisplaced fracture extending through the left anterior arch and lateral mass of C1 noted, stable in position and alignment from prior CT. No other visible fracture evident by MRI. Vertebral body height maintained. Underlying bone marrow signal intensity within normal limits. No discrete or worrisome osseous lesions. Discogenic reactive endplate change with mild marrow edema noted about the partially visualized T3-4 interspace. Reactive marrow edema about the left C2-3 facet due to facet arthritis (series 8, image 12). No other abnormal marrow edema. Cord: Normal signal and morphology. No evidence for traumatic cord injury. Ligamentous structures appear intact. Posterior Fossa, vertebral arteries, paraspinal tissues: Craniocervical junction remains widely patent and within normal limits. Paraspinous and prevertebral soft tissues demonstrate no acute finding. Normal flow voids seen within the vertebral arteries bilaterally. Chronic fatty atrophy of both parotid glands noted. Disc levels: C2-C3: Minimal disc bulge. Left greater than right facet hypertrophy, with associated reactive marrow edema and small joint effusion on the left. No spinal stenosis. Foramina remain patent. C3-C4: Degenerative intervertebral disc space narrowing with diffuse disc bulge. Left greater than right facet hypertrophy. No spinal stenosis. Mild bilateral C4 foraminal narrowing. C4-C5: Anterolisthesis. Mild disc bulge with uncovertebral hypertrophy. Bilateral facet degeneration. No significant spinal stenosis. Moderate to severe bilateral C5 foraminal narrowing. C5-C6: Degenerative intervertebral disc space narrowing with diffuse disc osteophyte complex. Posterior component flattens the ventral thecal sac without significant spinal stenosis. Superimposed mild left-sided facet hypertrophy. Moderate to severe left with moderate right C6 foraminal  narrowing. C6-C7: Degenerative intervertebral disc space narrowing with diffuse disc osteophyte complex. No significant spinal stenosis. Mild left C7 foraminal narrowing. Right neural foramina remains patent. C7-T1: Degenerative intervertebral disc space narrowing with diffuse disc osteophyte complex. Mild facet and ligament flavum hypertrophy. No significant spinal stenosis. Foramina remain  patent. Visualized upper thoracic spine demonstrates mild multilevel noncompressive disc bulging without significant stenosis. IMPRESSION: MRI HEAD IMPRESSION: 1. 2.5 x 1.1 x 1.8 cm well-circumscribed lenticular lesion involving the right lentiform nucleus/external capsule, favored to reflect an evolving subacute intraparenchymal hematoma, possibly related to a subacute hemorrhagic infarct. Mild surrounding vasogenic edema without significant regional mass effect. While no definite underlying mass lesion is identified, a short interval follow-up MRI to ensure these changes resolve and no underlying lesion is present is recommended. 2. No other acute intracranial abnormality. 3. Soft tissue contusion at the left and central posterior parieto-occipital scalp. MRI CERVICAL SPINE IMPRESSION: 1. Acute nondisplaced fracture extending through the left anterior arch and lateral mass of C1, stable in position and alignment from prior CT. 2. No other acute traumatic injury within the cervical spine. No evidence for acute cord injury or ligamentous disruption. 3. Multilevel cervical spondylosis without significant spinal stenosis. Moderate to severe bilateral C5 and C6 foraminal narrowing as above. Electronically Signed   By: Jeannine Boga M.D.   On: 10/29/2020 21:34   CT ABDOMEN PELVIS W CONTRAST  Result Date: 10/29/2020 CLINICAL DATA:  Pulmonary embolus suspected with high probability. Recent fall. Chronic back and right hip pain. EXAM: CT ANGIOGRAPHY CHEST CT ABDOMEN AND PELVIS WITH CONTRAST TECHNIQUE: Multidetector CT  imaging of the chest was performed using the standard protocol during bolus administration of intravenous contrast. Multiplanar CT image reconstructions and MIPs were obtained to evaluate the vascular anatomy. Multidetector CT imaging of the abdomen and pelvis was performed using the standard protocol during bolus administration of intravenous contrast. CONTRAST:  33mL OMNIPAQUE IOHEXOL 350 MG/ML SOLN COMPARISON:  CT abdomen and pelvis 10/26/2020 FINDINGS: CTA CHEST FINDINGS Cardiovascular: Good opacification of the central and segmental pulmonary arteries. No focal filling defects. No evidence of significant pulmonary embolus. Cardiac enlargement. No pericardial effusions. Mild coronary artery and aortic calcifications. No aortic dissection. Great vessel origins are patent. Mediastinum/Nodes: Esophagus is decompressed. No significant lymphadenopathy in the chest. Thyroid gland is unremarkable. Lungs/Pleura: Mild dependent changes in the lung bases. No airspace disease or consolidation. No pleural effusions. No pneumothorax. Musculoskeletal: Degenerative changes in the spine and shoulders. No destructive bone lesions identified. Review of the MIP images confirms the above findings. CT ABDOMEN and PELVIS FINDINGS Hepatobiliary: Mild diffuse fatty infiltration of the liver. No focal lesions identified. Surgical absence of the gallbladder. No bile duct dilatation. Pancreas: Unremarkable. No pancreatic ductal dilatation or surrounding inflammatory changes. Spleen: Normal in size without focal abnormality. Adrenals/Urinary Tract: Adrenal glands are unremarkable. Kidneys are normal, without renal calculi, focal lesion, or hydronephrosis. Bladder is unremarkable. Stomach/Bowel: Stomach, small bowel, and colon are not abnormally distended. Prominent submucosal fat in the right colon is likely related to obesity. Diverticulosis of the sigmoid colon. Stranding has improved since prior study without significant residual  inflammatory change. Appendix is normal. Vascular/Lymphatic: Aortic atherosclerosis. No enlarged abdominal or pelvic lymph nodes. Reproductive: Status post hysterectomy. No adnexal masses. Other: No free air or free fluid in the abdomen. Postoperative changes in the anterior abdominal wall consistent with mesh hernia repair. No residual or recurrent hernia is identified. Musculoskeletal: Mild lumbar scoliosis convex towards the right. Degenerative changes in the spine and hips. No destructive bone lesions. Review of the MIP images confirms the above findings. IMPRESSION: 1. No evidence of significant pulmonary embolus. 2. No evidence of active pulmonary disease. 3. No acute process demonstrated in the abdomen or pelvis. Electronically Signed   By: Lucienne Capers M.D.   On: 10/29/2020  21:36   CT T-SPINE NO CHARGE  Result Date: 10/29/2020 CLINICAL DATA:  Patient fell, striking head. Chronic back pain and right hip pain. EXAM: CT Thoracic and Lumbar spine with contrast TECHNIQUE: Multiplanar CT images of the thoracic and lumbar spine were reconstructed from contemporary CT of the Chest, Abdomen, and Pelvis CONTRAST:  No additional COMPARISON:  CT abdomen and pelvis and MRI lumbar spine 10/26/2020 FINDINGS: CT THORACIC SPINE FINDINGS Alignment: Mild thoracic scoliosis, convex towards the right. No anterior subluxations. Vertebrae: No vertebral compression deformities. No focal bone lesion or bone destruction. Bone cortex appears intact. Paraspinal and other soft tissues: No paraspinal soft tissue infiltration or mass. Disc levels: Degenerative changes throughout the thoracic spine with narrowed interspaces and endplate osteophyte formation. Bridging anterior osteophytes throughout. No evidence of severe bony encroachment upon the central canal. CT LUMBAR SPINE FINDINGS Segmentation: 5 lumbar type vertebrae. Alignment: Mild lumbar scoliosis convex towards the left. No anterior subluxations. Vertebrae: Schmorl's  node at the inferior endplate of L2, unchanged. No acute vertebral compression deformities. No focal bone lesions or bone destruction. Bone cortex appears intact. Paraspinal and other soft tissues: Paraspinal soft tissues appear intact. No evidence of paraspinal mass or soft tissue infiltration. Disc levels: Degenerative changes throughout the lumbar spine with narrowed interspaces and endplate osteophyte formation. Degenerative changes in the facet joints. Moderate central canal stenosis is suggested at L3-4 and L4-5 levels, caused by combination of osteophyte formation and disc bulging. IMPRESSION: 1. Mild thoracolumbar scoliosis with thoracic spine convex towards the right and lumbar spine towards the left. 2. No anterior subluxations. 3. No acute fractures or dislocation identified. 4. Diffuse degenerative changes. Electronically Signed   By: Lucienne Capers M.D.   On: 10/29/2020 21:45   CT L-SPINE NO CHARGE  Result Date: 10/29/2020 CLINICAL DATA:  Patient fell, striking head. Chronic back pain and right hip pain. EXAM: CT Thoracic and Lumbar spine with contrast TECHNIQUE: Multiplanar CT images of the thoracic and lumbar spine were reconstructed from contemporary CT of the Chest, Abdomen, and Pelvis CONTRAST:  No additional COMPARISON:  CT abdomen and pelvis and MRI lumbar spine 10/26/2020 FINDINGS: CT THORACIC SPINE FINDINGS Alignment: Mild thoracic scoliosis, convex towards the right. No anterior subluxations. Vertebrae: No vertebral compression deformities. No focal bone lesion or bone destruction. Bone cortex appears intact. Paraspinal and other soft tissues: No paraspinal soft tissue infiltration or mass. Disc levels: Degenerative changes throughout the thoracic spine with narrowed interspaces and endplate osteophyte formation. Bridging anterior osteophytes throughout. No evidence of severe bony encroachment upon the central canal. CT LUMBAR SPINE FINDINGS Segmentation: 5 lumbar type vertebrae.  Alignment: Mild lumbar scoliosis convex towards the left. No anterior subluxations. Vertebrae: Schmorl's node at the inferior endplate of L2, unchanged. No acute vertebral compression deformities. No focal bone lesions or bone destruction. Bone cortex appears intact. Paraspinal and other soft tissues: Paraspinal soft tissues appear intact. No evidence of paraspinal mass or soft tissue infiltration. Disc levels: Degenerative changes throughout the lumbar spine with narrowed interspaces and endplate osteophyte formation. Degenerative changes in the facet joints. Moderate central canal stenosis is suggested at L3-4 and L4-5 levels, caused by combination of osteophyte formation and disc bulging. IMPRESSION: 1. Mild thoracolumbar scoliosis with thoracic spine convex towards the right and lumbar spine towards the left. 2. No anterior subluxations. 3. No acute fractures or dislocation identified. 4. Diffuse degenerative changes. Electronically Signed   By: Lucienne Capers M.D.   On: 10/29/2020 21:45        Scheduled Meds:  stroke: mapping our early stages of recovery book   Does not apply Once   doxazosin  2 mg Oral QHS   gabapentin  100 mg Oral BID   losartan  100 mg Oral Daily   And   hydrochlorothiazide  25 mg Oral Daily   venlafaxine  37.5 mg Oral BID   Continuous Infusions:  niCARDipine Stopped (10/30/20 0234)     LOS: 1 day    Time spent: 33 mins     Wyvonnia Dusky, MD [facility] Pager [phone]  If 7PM-7AM, please contact night-coverage 10/30/2020, 8:29 AM

## 2020-10-30 NOTE — ED Notes (Signed)
Patient used bedpan for episode of diarrhea. Stool sample collected and taken to lab.

## 2020-10-30 NOTE — Consult Note (Signed)
Referring Physician:  No referring provider defined for this encounter.  Primary Physician:  Venia Carbon, MD  Chief Complaint:  C1 fracture  History of Present Illness: 10/30/2020 [name] is a 80 y.o. female who presents with the chief complaint of neck pain after a fall.  She fell backwards from her wheelchair.  She is having neck pain after her fall.  She has no other complaints.  She has no new neurological issues.  She is now in a C collar.  She lives at home with her son.  She is felt to be unsafe for discharge.   Review of Systems:  A 10 point review of systems is negative, except for the pertinent positives and negatives detailed in the HPI.  Past Medical History: Past Medical History:  Diagnosis Date   Allergic rhinitis    Anal fissure    Arthritis    Asthma    Depression    Diabetes mellitus    type 2   Diverticulosis    Fibromyalgia    GERD (gastroesophageal reflux disease)    Hypertension    Obesity    Osteoarthritis     Past Surgical History: Past Surgical History:  Procedure Laterality Date   ABDOMINAL HYSTERECTOMY  1970's   CATARACT EXTRACTION, BILATERAL     CHOLECYSTECTOMY  4/06   TUBAL LIGATION  1970's   VENTRAL HERNIA REPAIR  9/08   [facility]    Allergies: Allergies as of 10/29/2020 - Review Complete 10/29/2020  Allergen Reaction Noted   Diphenhydramine hcl Other (See Comments) 01/09/2015   Fluoxetine hcl  06/20/2007   Lisinopril  05/07/2006   Sertraline hcl  07/25/2007    Medications:  Current Facility-Administered Medications:     stroke: mapping our early stages of recovery book, , Does not apply, Once, Anda Latina, MD   acetaminophen (TYLENOL) tablet 650 mg, 650 mg, Oral, Q4H PRN, 650 mg at 10/30/20 0555 **OR** acetaminophen (TYLENOL) 160 MG/5ML solution 650 mg, 650 mg, Per Tube, Q4H PRN **OR** acetaminophen (TYLENOL) suppository 650 mg, 650 mg, Rectal, Q4H PRN, Anda Latina, MD   doxazosin (CARDURA) tablet 2 mg, 2 mg,  Oral, QHS, Anda Latina, MD   gabapentin (NEURONTIN) capsule 100 mg, 100 mg, Oral, BID, Anda Latina, MD, 100 mg at 10/30/20 0941   losartan (COZAAR) tablet 100 mg, 100 mg, Oral, Daily, 100 mg at 10/30/20 0941 **AND** hydrochlorothiazide (HYDRODIURIL) tablet 25 mg, 25 mg, Oral, Daily, Belue, Alver Sorrow, RPH, 25 mg at 10/30/20 0941   labetalol (NORMODYNE) injection 10 mg, 10 mg, Intravenous, Q4H PRN, Anda Latina, MD   ondansetron (ZOFRAN) 8 mg in sodium chloride 0.9 % 50 mL IVPB, 8 mg, Intravenous, Q8H PRN, Wyvonnia Dusky, MD, Stopped at 10/30/20 1159   oxyCODONE-acetaminophen (PERCOCET/ROXICET) 5-325 MG per tablet 1 tablet, 1 tablet, Oral, Q6H PRN, Anda Latina, MD, 1 tablet at 10/30/20 0247   potassium chloride SA (KLOR-CON) CR tablet 40 mEq, 40 mEq, Oral, Once, Wyvonnia Dusky, MD   venlafaxine [facility]) tablet 37.5 mg, 37.5 mg, Oral, BID, Anda Latina, MD, 37.5 mg at 10/30/20 0941  Current Outpatient Medications:    HYDROcodone-acetaminophen (NORCO) 5-325 MG tablet, Take 1 tablet by mouth at bedtime., Disp: 4 tablet, Rfl: 0   hyoscyamine (LEVSIN) 0.125 MG tablet, Take 1 tablet (0.125 mg total) by mouth 3 (three) times daily as needed. Before meals. Don't use imodium also, Disp: 30 tablet, Rfl: 0   lidocaine (LIDODERM) 5 %, Place 1 patch onto the skin every 12 (  twelve) hours. Remove & Discard patch within 12 hours or as directed by MD, Disp: 10 patch, Rfl: 0   ondansetron (ZOFRAN ODT) 4 MG disintegrating tablet, Take 1 tablet (4 mg total) by mouth every 6 (six) hours as needed for nausea or vomiting., Disp: 20 tablet, Rfl: 0   oxyCODONE-acetaminophen (PERCOCET/ROXICET) 5-325 MG tablet, Take 1 tablet by mouth every 6 (six) hours as needed., Disp: 12 tablet, Rfl: 0   Accu-Chek Softclix Lancets lancets, Use to obtain blood sugar sample Dx Code E11.40, Disp: 100 each, Rfl: 3   aspirin 81 MG tablet, Take 81 mg by mouth daily. (Patient not taking: No sig reported), Disp: , Rfl:    Cyanocobalamin  (VITAMIN B12 PO), Take by mouth. (Patient not taking: No sig reported), Disp: , Rfl:    doxazosin (CARDURA) 4 MG tablet, TAKE 1/2 TABLET TWICE DAILY (Patient not taking: No sig reported), Disp: 90 tablet, Rfl: 3   FeFum-FePoly-FA-B Cmp-C-Biot (INTEGRA PLUS) CAPS, Take 1 capsule by mouth every morning. (Patient not taking: No sig reported), Disp: 30 capsule, Rfl: 2   gabapentin (NEURONTIN) 100 MG capsule, TAKE 1 CAPSULE TWICE DAILY (Patient not taking: No sig reported), Disp: 180 capsule, Rfl: 3   glipiZIDE (GLUCOTROL XL) 10 MG 24 hr tablet, TAKE 1 TABLET TWICE DAILY (Patient not taking: No sig reported), Disp: 180 tablet, Rfl: 3   Glucosamine-Chondroitin (COSAMIN DS PO), Take by mouth. (Patient not taking: No sig reported), Disp: , Rfl:    glucose blood (ACCU-CHEK GUIDE) test strip, Use to check blood sugar once daily Dx Code E11.40, Disp: 100 each, Rfl: 3   losartan-hydrochlorothiazide (HYZAAR) 100-25 MG tablet, TAKE 1 TABLET EVERY DAY (Patient not taking: No sig reported), Disp: 90 tablet, Rfl: 3   metFORMIN (GLUCOPHAGE-XR) 750 MG 24 hr tablet, TAKE 2 TABLETS EVERY DAY WITH BREAKFAST (Patient not taking: No sig reported), Disp: 180 tablet, Rfl: 3   Multiple Vitamin (MULTIVITAMIN) tablet, Take 1 tablet by mouth daily. (Patient not taking: No sig reported), Disp: , Rfl:    OVER THE COUNTER MEDICATION, Knock Out All Natural Sleep Aid (Patient not taking: No sig reported), Disp: , Rfl:    potassium chloride SA (KLOR-CON) 20 MEQ tablet, Take 1 tablet (20 mEq total) by mouth 2 (two) times daily for 5 days. (Patient not taking: No sig reported), Disp: 10 tablet, Rfl: 0   tiZANidine (ZANAFLEX) 4 MG tablet, TAKE 1 TABLET AT BEDTIME (Patient not taking: No sig reported), Disp: 90 tablet, Rfl: 1   venlafaxine (EFFEXOR) 37.5 MG tablet, TAKE 1 TABLET TWICE DAILY (Patient not taking: No sig reported), Disp: 180 tablet, Rfl: 3   Social History: Social History   Tobacco Use   Smoking status: Never   Smokeless  tobacco: Never  Vaping Use   Vaping Use: Never used  Substance Use Topics   Alcohol use: No    Alcohol/week: 0.0 standard drinks   Drug use: No    Family Medical History: Family History  Problem Relation Age of Onset   Heart attack Father    Coronary artery disease Father    Kidney cancer Mother    Lung cancer Brother    Liver cancer Brother    Diabetes Other        Maternal side   Brain cancer Daughter    Hypertension Neg Hx    Breast cancer Neg Hx    Colon cancer Neg Hx     Physical Examination: Vitals:   10/30/20 1300 10/30/20 1400  BP:  Marland Kitchen)  153/75  Pulse: 63 75  Resp: 13 19  Temp:    SpO2: 95% 97%     General: Patient is well developed, well nourished, calm, collected, and in no apparent distress.  Psychiatric: Patient is non-anxious.  Head:  Pupils equal, round, and reactive to light.  ENT:  Oral mucosa appears well hydrated.  Neck:   Supple.  In collar  Respiratory: Patient is breathing without any difficulty.  Extremities: No edema.  Vascular: Palpable pulses in dorsal pedal vessels.  Skin:   On exposed skin, there are no abnormal skin lesions.  NEUROLOGICAL:  General: In no acute distress.   Awake, alert, oriented to person, place, and time.  Pupils equal round and reactive to light.  Facial tone is symmetric.  Tongue protrusion is midline.  There is no pronator drift.   Strength: Side Biceps Triceps Deltoid Interossei Grip Wrist Ext. Wrist Flex.  R 5 5 5 5 5 5 5   L 5 5 5 5 5 5 5    Side Iliopsoas Quads Hamstring PF DF EHL  R 5 5 5 5 5 5   L 5 5 5 5 5 5     Bilateral upper and lower extremity sensation is intact to light touch. Reflexes are 1+ and symmetric at the biceps, triceps, brachioradialis, patella and achilles. Hoffman's is absent.  Clonus is not present.  Toes are down-going.    Gait is untested.   Imaging: CT C spine 10/29/20 IMPRESSION: 1. There is a faintly rim hyperdense masslike lesion in the subinsular deep gray matter  of the right hemisphere, in the vicinity of the external capsule and lentiform nuclei, new compared to prior examination and highly suspicious for primary brain mass or metastasis. Recommend contrast enhanced MRI to further evaluate. 2. Nondisplaced acute fracture of the lateral left anterior arch of C1, extending into the left lateral mass and articular facets. No other fracture or static subluxation of the cervical spine. 3. Scalp contusion and laceration of the occiput.     Electronically Signed   By: Eddie Candle M.D.   On: 10/29/2020 15:57  MRI Brain and C spine 10/29/20   IMPRESSION: MRI HEAD IMPRESSION:   1. 2.5 x 1.1 x 1.8 cm well-circumscribed lenticular lesion involving the right lentiform nucleus/external capsule, favored to reflect an evolving subacute intraparenchymal hematoma, possibly related to a subacute hemorrhagic infarct. Mild surrounding vasogenic edema without significant regional mass effect. While no definite underlying mass lesion is identified, a short interval follow-up MRI to ensure these changes resolve and no underlying lesion is present is recommended. 2. No other acute intracranial abnormality. 3. Soft tissue contusion at the left and central posterior parieto-occipital scalp.   MRI CERVICAL SPINE IMPRESSION:   1. Acute nondisplaced fracture extending through the left anterior arch and lateral mass of C1, stable in position and alignment from prior CT. 2. No other acute traumatic injury within the cervical spine. No evidence for acute cord injury or ligamentous disruption. 3. Multilevel cervical spondylosis without significant spinal stenosis. Moderate to severe bilateral C5 and C6 foraminal narrowing as above.     Electronically Signed   By: Jeannine Boga M.D.   On: 10/29/2020 21:34  I have personally reviewed the images and agree with the above interpretation.  Labs: CBC Latest Ref Rng & Units 10/30/2020 10/29/2020 10/26/2020   WBC 4.0 - 10.5 K/uL 6.3 8.4 7.8  Hemoglobin 12.0 - 15.0 g/dL 10.6(L) 11.9(L) 11.9(L)  Hematocrit 36.0 - 46.0 % 31.1(L) 35.0(L) 34.5(L)  Platelets 150 -  400 K/uL 200 234 244       Assessment and Plan: [name] is a pleasant 80 y.o. female with stable C1 lateral mass fracture.  She also has an area in the R external capsule / basal ganglia that appears most consistent with a subacute hemorrhage stroke.  She is asymptomatic from this.  - Continue collar.  We will arrange outpatient follow up. - For likely subacute stroke - tumor is in the differential.  We will arrange delayed MRI brain to evaluate after the blood products have cleared - Other care per primary team.    Lovie Chol. Izora Ribas MD, [facility]

## 2020-10-31 ENCOUNTER — Inpatient Hospital Stay: Payer: Medicare PPO

## 2020-10-31 DIAGNOSIS — R197 Diarrhea, unspecified: Secondary | ICD-10-CM | POA: Diagnosis not present

## 2020-10-31 DIAGNOSIS — I619 Nontraumatic intracerebral hemorrhage, unspecified: Secondary | ICD-10-CM | POA: Diagnosis not present

## 2020-10-31 DIAGNOSIS — S12001A Unspecified nondisplaced fracture of first cervical vertebra, initial encounter for closed fracture: Secondary | ICD-10-CM | POA: Diagnosis not present

## 2020-10-31 LAB — BASIC METABOLIC PANEL
Anion gap: 16 — ABNORMAL HIGH (ref 5–15)
BUN: 19 mg/dL (ref 8–23)
CO2: 20 mmol/L — ABNORMAL LOW (ref 22–32)
Calcium: 8.5 mg/dL — ABNORMAL LOW (ref 8.9–10.3)
Chloride: 99 mmol/L (ref 98–111)
Creatinine, Ser: 1.14 mg/dL — ABNORMAL HIGH (ref 0.44–1.00)
GFR, Estimated: 49 mL/min — ABNORMAL LOW (ref >60)
Glucose, Bld: 206 mg/dL — ABNORMAL HIGH (ref 70–99)
Potassium: 3.5 mmol/L (ref 3.5–5.1)
Sodium: 135 mmol/L (ref 135–145)

## 2020-10-31 LAB — ECHOCARDIOGRAM COMPLETE
AR max vel: 2.2 cm2
AV Area VTI: 2.61 cm2
AV Area mean vel: 2.35 cm2
AV Mean grad: 3 mmHg
AV Peak grad: 5.9 mmHg
Ao pk vel: 1.21 m/s
Area-P 1/2: 4.41 cm2
Height: 64 in
S' Lateral: 2.74 cm
Weight: 2800 oz

## 2020-10-31 LAB — CBC
HCT: 29.6 % — ABNORMAL LOW (ref 36.0–46.0)
Hemoglobin: 10.3 g/dL — ABNORMAL LOW (ref 12.0–15.0)
MCH: 33.1 pg (ref 26.0–34.0)
MCHC: 34.8 g/dL (ref 30.0–36.0)
MCV: 95.2 fL (ref 80.0–100.0)
Platelets: 188 10*3/uL (ref 150–400)
RBC: 3.11 MIL/uL — ABNORMAL LOW (ref 3.87–5.11)
RDW: 14 % (ref 11.5–15.5)
WBC: 5.3 10*3/uL (ref 4.0–10.5)
nRBC: 0 % (ref 0.0–0.2)

## 2020-10-31 LAB — HEMOGLOBIN A1C
Hgb A1c MFr Bld: 7 % — ABNORMAL HIGH (ref 4.8–5.6)
Mean Plasma Glucose: 154.2 mg/dL

## 2020-10-31 LAB — CBG MONITORING, ED: Glucose-Capillary: 212 mg/dL — ABNORMAL HIGH (ref 70–99)

## 2020-10-31 LAB — MAGNESIUM: Magnesium: 1.9 mg/dL (ref 1.7–2.4)

## 2020-10-31 MED ORDER — INSULIN ASPART 100 UNIT/ML IJ SOLN
0.0000 [IU] | Freq: Three times a day (TID) | INTRAMUSCULAR | Status: DC
  Administered 2020-10-31: 3 [IU] via SUBCUTANEOUS
  Administered 2020-11-01 (×3): 2 [IU] via SUBCUTANEOUS
  Administered 2020-11-02: 1 [IU] via SUBCUTANEOUS
  Administered 2020-11-02: 2 [IU] via SUBCUTANEOUS
  Administered 2020-11-03: 1 [IU] via SUBCUTANEOUS
  Administered 2020-11-03: 3 [IU] via SUBCUTANEOUS
  Administered 2020-11-03: 2 [IU] via SUBCUTANEOUS
  Administered 2020-11-04: 5 [IU] via SUBCUTANEOUS
  Administered 2020-11-04 – 2020-11-05 (×4): 2 [IU] via SUBCUTANEOUS
  Administered 2020-11-05: 5 [IU] via SUBCUTANEOUS
  Administered 2020-11-06: 3 [IU] via SUBCUTANEOUS
  Administered 2020-11-06: 7 [IU] via SUBCUTANEOUS
  Administered 2020-11-06: 2 [IU] via SUBCUTANEOUS
  Administered 2020-11-07: 5 [IU] via SUBCUTANEOUS
  Administered 2020-11-07: 2 [IU] via SUBCUTANEOUS
  Filled 2020-10-31 (×18): qty 1

## 2020-10-31 MED ORDER — IOHEXOL 350 MG/ML SOLN
75.0000 mL | Freq: Once | INTRAVENOUS | Status: AC | PRN
  Administered 2020-10-31: 75 mL via INTRAVENOUS

## 2020-10-31 NOTE — Progress Notes (Signed)
PROGRESS NOTE    [name]  AYT:[id number] DOB: October 15, 1940 DOA: 10/29/2020 PCP: Venia Carbon, MD   Assessment & Plan:   Active Problems:   Essential hypertension, benign   GERD   Fibromyalgia   Osteoarthritis, knee   Type 2 diabetes, uncontrolled, with neuropathy (HCC)   Iron deficiency anemia   Stage 3b chronic kidney disease (HCC)   Hemorrhagic stroke (HCC)   Subacute hemorrhagic infarct: MRI brain shows 2.5 x 1.1 x 1.8 cm well-circumscribed lenticular lesion involving the right lentiform nucleus/external capsule, favored to reflect an evolving subacute intraparenchymal hematoma, possibly related to a subacute hemorrhagic infarct. Mild surrounding vasogenic edema without significant regional mass effect. CTA head & neck show suspected right external capsule hematoma & no progressive mass effect, no large vessel occlusion or significant stenosis in the head or neck. Neuro & neuro surg recs apprec. Continue w/ neuro checks. Echo is pending. PT/OT consulted   C1 fracture: likely secondary to recent fall. Continue w/ C collar.  Will need outpatient f/u w/ neuro surg and repeat MRI as an outpatient. Unable to do carotid US at this time as C collar cannot be removed currently to manipulate neck for test.    S/p fall and back pain: head/neck CT and MRI see above. Scalp laceration no need suture. C1 fracture on c collar, f/u w/ Dr. Lacinda Axon. Chest CTA and A/P  negative. Lumbar spine MRI on 9/18 showed degenerative changes.    Diarrhea: h/o taking augmentin for diverticulitis. GI PCR panel and c. diff are both neg. Imodium prn   Hypokalemia: WNL today    HTN: continue on cardura, losartan, HCTZ. Recently not taking meds   DM2: likely poorly controlled. Continue on SSI w/ accuchecks   Normocytic anemia: H&H are labile. No need for a transfusion currently   CKDIIIb: Cr is labile. Avoid nephrotoxic meds.   Fibromyalgia and chronic pain: continue on home dose of gabapentin, effexor      DVT prophylaxis: SCDs secondary to above Code Status: full  Family Communication:  Disposition Plan: depends on PT/OT recs  Level of care: Stepdown  Status is: Inpatient  Remains inpatient appropriate because:Unsafe d/c plan, IV treatments appropriate due to intensity of illness or inability to take PO, and Inpatient level of care appropriate due to severity of illness  Dispo: The patient is from: Home              Anticipated d/c is to: unclear              Patient currently is not medically stable to d/c.   Difficult to place patient : unclear      Consultants:  Neuro Neuro surg   Procedures:   Antimicrobials:   Subjective: Pt c/o diarrhea especially after eating   Objective: Vitals:   10/31/20 0002 10/31/20 0230 10/31/20 0430 10/31/20 0700  BP: 116/64 140/65 108/60 (!) 108/57  Pulse: 90 81 76 84  Resp: 15 14 14 11   Temp:      TempSrc:      SpO2: 94% 93% 94% 93%  Weight:      Height:        Intake/Output Summary (Last 24 hours) at 10/31/2020 0808 Last data filed at 10/30/2020 1722 Gross per 24 hour  Intake 52.94 ml  Output 300 ml  Net -247.06 ml   Filed Weights   10/29/20 1447  Weight: 79.4 kg    Examination:  General exam: Appears frustrated  Respiratory system: Clear breath sounds b/l  Cardiovascular system: S1/S2+. No rubs or gallops Gastrointestinal system: Abd is soft, NT, ND & hyperactive bowel sounds  Central nervous system: Alert and oriented. Moves all extremities  Psychiatry: Judgement and insight appear normal. Agitated and frustrated     Data Reviewed: I have personally reviewed following labs and imaging studies  CBC: Recent Labs  Lab 10/24/20 1855 10/26/20 1612 10/29/20 1947 10/30/20 0503 10/31/20 0703  WBC 6.0 7.8 8.4 6.3 5.3  NEUTROABS  --   --  6.5  --   --   HGB 12.0 11.9* 11.9* 10.6* 10.3*  HCT 34.2* 34.5* 35.0* 31.1* 29.6*  MCV 94.0 94.8 94.3 92.8 95.2  PLT 219 244 234 200 025   Basic Metabolic  Panel: Recent Labs  Lab 10/24/20 1855 10/26/20 1612 10/26/20 1912 10/29/20 1947 10/30/20 0503 10/31/20 0703  NA 134* 136  --  137 137 135  K 3.1* 3.5  --  3.7 3.2* 3.5  CL 98 100  --  101 104 99  CO2 27 23  --  24 22 20*  GLUCOSE 260* 243*  --  198* 170* 206*  BUN 12 14  --  16 14 19   CREATININE 1.03* 1.15*  --  1.24* 1.06* 1.14*  CALCIUM 8.9 9.0  --  9.0 7.9* 8.5*  MG 1.8  --  1.8  --  1.9 1.9   GFR: Estimated Creatinine Clearance: 40.8 mL/min (A) (by C-G formula based on SCr of 1.14 mg/dL (H)). Liver Function Tests: Recent Labs  Lab 10/24/20 1855 10/26/20 1621  AST 41 35  ALT 25 26  ALKPHOS 66 73  BILITOT 1.2 1.7*  PROT 7.3 7.3  ALBUMIN 3.2* 3.3*   Recent Labs  Lab 10/24/20 1855  LIPASE 21   No results for input(s): AMMONIA in the last 168 hours. Coagulation Profile: No results for input(s): INR, PROTIME in the last 168 hours. Cardiac Enzymes: No results for input(s): CKTOTAL, CKMB, CKMBINDEX, TROPONINI in the last 168 hours. BNP (last 3 results) No results for input(s): PROBNP in the last 8760 hours. HbA1C: No results for input(s): HGBA1C in the last 72 hours. CBG: Recent Labs  Lab 10/24/20 2312 10/26/20 1812  GLUCAP 198* 236*   Lipid Profile: Recent Labs    10/30/20 0503  CHOL 105  HDL 36*  LDLCALC 46  TRIG 116  CHOLHDL 2.9   Thyroid Function Tests: No results for input(s): TSH, T4TOTAL, FREET4, T3FREE, THYROIDAB in the last 72 hours. Anemia Panel: No results for input(s): VITAMINB12, FOLATE, FERRITIN, TIBC, IRON, RETICCTPCT in the last 72 hours. Sepsis Labs: No results for input(s): PROCALCITON, LATICACIDVEN in the last 168 hours.  Recent Results (from the past 240 hour(s))  C Difficile Quick Screen w PCR reflex     Status: None   Collection Time: 10/29/20 10:14 PM   Specimen: Stool  Result Value Ref Range Status   C Diff antigen NEGATIVE NEGATIVE Final   C Diff toxin NEGATIVE NEGATIVE Final   C Diff interpretation No C. difficile  detected.  Final    Comment: Performed at [facility], [address] [place], [place] [id number]  Resp Panel by RT-PCR (Flu A&B, Covid) Nasopharyngeal Swab     Status: None   Collection Time: 10/29/20 11:31 PM   Specimen: Nasopharyngeal Swab; Nasopharyngeal(NP) swabs in vial transport medium  Result Value Ref Range Status   SARS Coronavirus 2 by RT PCR NEGATIVE NEGATIVE Final    Comment: (NOTE) SARS-CoV-2 target nucleic acids are NOT DETECTED.  The SARS-CoV-2  RNA is generally detectable in upper respiratory specimens during the acute phase of infection. The lowest concentration of SARS-CoV-2 viral copies this assay can detect is 138 copies/mL. A negative result does not preclude SARS-Cov-2 infection and should not be used as the sole basis for treatment or other patient management decisions. A negative result may occur with  improper specimen collection/handling, submission of specimen other than nasopharyngeal swab, presence of viral mutation(s) within the areas targeted by this assay, and inadequate number of viral copies(<138 copies/mL). A negative result must be combined with clinical observations, patient history, and epidemiological information. The expected result is Negative.  Fact Sheet for Patients:  EntrepreneurPulse.com.au  Fact Sheet for Healthcare Providers:  IncredibleEmployment.be  This test is no t yet approved or cleared by the [place] FDA and  has been authorized for detection and/or diagnosis of SARS-CoV-2 by FDA under an Emergency Use Authorization (EUA). This EUA will remain  in effect (meaning this test can be used) for the duration of the COVID-19 declaration under Section 564(b)(1) of the Act, 21 U.S.C.section 360bbb-3(b)(1), unless the authorization is terminated  or revoked sooner.       Influenza A by PCR NEGATIVE NEGATIVE Final   Influenza B by PCR NEGATIVE NEGATIVE Final    Comment: (NOTE) The  Xpert Xpress SARS-CoV-2/FLU/RSV plus assay is intended as an aid in the diagnosis of influenza from Nasopharyngeal swab specimens and should not be used as a sole basis for treatment. Nasal washings and aspirates are unacceptable for Xpert Xpress SARS-CoV-2/FLU/RSV testing.  Fact Sheet for Patients: EntrepreneurPulse.com.au  Fact Sheet for Healthcare Providers: IncredibleEmployment.be  This test is not yet approved or cleared by the [place] FDA and has been authorized for detection and/or diagnosis of SARS-CoV-2 by FDA under an Emergency Use Authorization (EUA). This EUA will remain in effect (meaning this test can be used) for the duration of the COVID-19 declaration under Section 564(b)(1) of the Act, 21 U.S.C. section 360bbb-3(b)(1), unless the authorization is terminated or revoked.  Performed at [facility], [address] [place], [place] [address]   Gastrointestinal Panel by PCR , Stool     Status: None   Collection Time: 10/30/20 11:55 AM   Specimen: Stool  Result Value Ref Range Status   Campylobacter species NOT DETECTED NOT DETECTED Final   Plesimonas shigelloides NOT DETECTED NOT DETECTED Final   Salmonella species NOT DETECTED NOT DETECTED Final   Yersinia enterocolitica NOT DETECTED NOT DETECTED Final   Vibrio species NOT DETECTED NOT DETECTED Final   Vibrio cholerae NOT DETECTED NOT DETECTED Final   Enteroaggregative E coli (EAEC) NOT DETECTED NOT DETECTED Final   Enteropathogenic E coli (EPEC) NOT DETECTED NOT DETECTED Final   Enterotoxigenic E coli (ETEC) NOT DETECTED NOT DETECTED Final   Shiga like toxin producing E coli (STEC) NOT DETECTED NOT DETECTED Final   Shigella/Enteroinvasive E coli (EIEC) NOT DETECTED NOT DETECTED Final   Cryptosporidium NOT DETECTED NOT DETECTED Final   Cyclospora cayetanensis NOT DETECTED NOT DETECTED Final   Entamoeba histolytica NOT DETECTED NOT DETECTED Final   Giardia  lamblia NOT DETECTED NOT DETECTED Final   Adenovirus F40/41 NOT DETECTED NOT DETECTED Final   Astrovirus NOT DETECTED NOT DETECTED Final   Norovirus GI/GII NOT DETECTED NOT DETECTED Final   Rotavirus A NOT DETECTED NOT DETECTED Final   Sapovirus (I, II, IV, and V) NOT DETECTED NOT DETECTED Final    Comment: Performed at [facility], [address] [place], [place] [address]  Radiology Studies: CT HEAD WO CONTRAST (5MM)  Result Date: 10/29/2020 CLINICAL DATA:  Fall from wheelchair, hit head, laceration to back of head, neck pain EXAM: CT HEAD WITHOUT CONTRAST CT CERVICAL SPINE WITHOUT CONTRAST TECHNIQUE: Multidetector CT imaging of the head and cervical spine was performed following the standard protocol without intravenous contrast. Multiplanar CT image reconstructions of the cervical spine were also generated. COMPARISON:  08/31/2020 FINDINGS: CT HEAD FINDINGS Brain: No evidence of acute infarction, hemorrhage, hydrocephalus, or extra-axial collection. There is a faintly rim hyperdense masslike lesion in the subinsular deep gray matter of the right hemisphere, in the vicinity of the external capsule and lentiform nuclei, measuring approximately 2.6 x 1.3 x 1.9 cm (series 2, image 14, series 4, image 37). Mild periventricular white matter hypodensity. Vascular: No hyperdense vessel or unexpected calcification. Skull: Normal. Negative for fracture or focal lesion. Sinuses/Orbits: No acute finding. Other: Scalp contusion and laceration of the occiput (series 2, image 13). CT CERVICAL SPINE FINDINGS Alignment: Normal. Skull base and vertebrae: There is a nondisplaced fracture of the lateral left anterior arch of C1, extending into the left lateral mass and articular facets (series 2, image 30). No primary bone lesion or focal pathologic process. Soft tissues and spinal canal: No prevertebral fluid or swelling. No visible canal hematoma. Disc levels: Moderate disc space height loss and  osteophytosis of the lower cervical levels. Upper chest: Negative. Other: None. IMPRESSION: 1. There is a faintly rim hyperdense masslike lesion in the subinsular deep gray matter of the right hemisphere, in the vicinity of the external capsule and lentiform nuclei, new compared to prior examination and highly suspicious for primary brain mass or metastasis. Recommend contrast enhanced MRI to further evaluate. 2. Nondisplaced acute fracture of the lateral left anterior arch of C1, extending into the left lateral mass and articular facets. No other fracture or static subluxation of the cervical spine. 3. Scalp contusion and laceration of the occiput. Electronically Signed   By: Eddie Candle M.D.   On: 10/29/2020 15:57   CT Angio Chest PE W and/or Wo Contrast  Result Date: 10/29/2020 CLINICAL DATA:  Pulmonary embolus suspected with high probability. Recent fall. Chronic back and right hip pain. EXAM: CT ANGIOGRAPHY CHEST CT ABDOMEN AND PELVIS WITH CONTRAST TECHNIQUE: Multidetector CT imaging of the chest was performed using the standard protocol during bolus administration of intravenous contrast. Multiplanar CT image reconstructions and MIPs were obtained to evaluate the vascular anatomy. Multidetector CT imaging of the abdomen and pelvis was performed using the standard protocol during bolus administration of intravenous contrast. CONTRAST:  67mL OMNIPAQUE IOHEXOL 350 MG/ML SOLN COMPARISON:  CT abdomen and pelvis 10/26/2020 FINDINGS: CTA CHEST FINDINGS Cardiovascular: Good opacification of the central and segmental pulmonary arteries. No focal filling defects. No evidence of significant pulmonary embolus. Cardiac enlargement. No pericardial effusions. Mild coronary artery and aortic calcifications. No aortic dissection. Great vessel origins are patent. Mediastinum/Nodes: Esophagus is decompressed. No significant lymphadenopathy in the chest. Thyroid gland is unremarkable. Lungs/Pleura: Mild dependent changes in  the lung bases. No airspace disease or consolidation. No pleural effusions. No pneumothorax. Musculoskeletal: Degenerative changes in the spine and shoulders. No destructive bone lesions identified. Review of the MIP images confirms the above findings. CT ABDOMEN and PELVIS FINDINGS Hepatobiliary: Mild diffuse fatty infiltration of the liver. No focal lesions identified. Surgical absence of the gallbladder. No bile duct dilatation. Pancreas: Unremarkable. No pancreatic ductal dilatation or surrounding inflammatory changes. Spleen: Normal in size without focal abnormality. Adrenals/Urinary Tract: Adrenal glands are  unremarkable. Kidneys are normal, without renal calculi, focal lesion, or hydronephrosis. Bladder is unremarkable. Stomach/Bowel: Stomach, small bowel, and colon are not abnormally distended. Prominent submucosal fat in the right colon is likely related to obesity. Diverticulosis of the sigmoid colon. Stranding has improved since prior study without significant residual inflammatory change. Appendix is normal. Vascular/Lymphatic: Aortic atherosclerosis. No enlarged abdominal or pelvic lymph nodes. Reproductive: Status post hysterectomy. No adnexal masses. Other: No free air or free fluid in the abdomen. Postoperative changes in the anterior abdominal wall consistent with mesh hernia repair. No residual or recurrent hernia is identified. Musculoskeletal: Mild lumbar scoliosis convex towards the right. Degenerative changes in the spine and hips. No destructive bone lesions. Review of the MIP images confirms the above findings. IMPRESSION: 1. No evidence of significant pulmonary embolus. 2. No evidence of active pulmonary disease. 3. No acute process demonstrated in the abdomen or pelvis. Electronically Signed   By: Lucienne Capers M.D.   On: 10/29/2020 21:36   CT Cervical Spine Wo Contrast  Result Date: 10/29/2020 CLINICAL DATA:  Fall from wheelchair, hit head, laceration to back of head, neck pain  EXAM: CT HEAD WITHOUT CONTRAST CT CERVICAL SPINE WITHOUT CONTRAST TECHNIQUE: Multidetector CT imaging of the head and cervical spine was performed following the standard protocol without intravenous contrast. Multiplanar CT image reconstructions of the cervical spine were also generated. COMPARISON:  08/31/2020 FINDINGS: CT HEAD FINDINGS Brain: No evidence of acute infarction, hemorrhage, hydrocephalus, or extra-axial collection. There is a faintly rim hyperdense masslike lesion in the subinsular deep gray matter of the right hemisphere, in the vicinity of the external capsule and lentiform nuclei, measuring approximately 2.6 x 1.3 x 1.9 cm (series 2, image 14, series 4, image 37). Mild periventricular white matter hypodensity. Vascular: No hyperdense vessel or unexpected calcification. Skull: Normal. Negative for fracture or focal lesion. Sinuses/Orbits: No acute finding. Other: Scalp contusion and laceration of the occiput (series 2, image 13). CT CERVICAL SPINE FINDINGS Alignment: Normal. Skull base and vertebrae: There is a nondisplaced fracture of the lateral left anterior arch of C1, extending into the left lateral mass and articular facets (series 2, image 30). No primary bone lesion or focal pathologic process. Soft tissues and spinal canal: No prevertebral fluid or swelling. No visible canal hematoma. Disc levels: Moderate disc space height loss and osteophytosis of the lower cervical levels. Upper chest: Negative. Other: None. IMPRESSION: 1. There is a faintly rim hyperdense masslike lesion in the subinsular deep gray matter of the right hemisphere, in the vicinity of the external capsule and lentiform nuclei, new compared to prior examination and highly suspicious for primary brain mass or metastasis. Recommend contrast enhanced MRI to further evaluate. 2. Nondisplaced acute fracture of the lateral left anterior arch of C1, extending into the left lateral mass and articular facets. No other fracture or  static subluxation of the cervical spine. 3. Scalp contusion and laceration of the occiput. Electronically Signed   By: Eddie Candle M.D.   On: 10/29/2020 15:57   MR Brain W and Wo Contrast  Result Date: 10/29/2020 CLINICAL DATA:  Initial evaluation for brain mass or lesion, fall. EXAM: MRI HEAD WITHOUT AND WITH CONTRAST MRI CERVICAL SPINE WITHOUT AND WITH CONTRAST TECHNIQUE: Multiplanar, multiecho pulse sequences of the brain and surrounding structures, and cervical spine, to include the craniocervical junction and cervicothoracic junction, were obtained without and with intravenous contrast. CONTRAST:  7.75mL GADAVIST GADOBUTROL 1 MMOL/ML IV SOLN COMPARISON:  Prior CTs from earlier the same day. FINDINGS:  MRI HEAD FINDINGS Brain: Cerebral volume within normal limits for age. Patchy T2/FLAIR hyperintensity involving the periventricular and deep white matter both cerebral hemispheres noted, most consistent with chronic small vessel ischemic disease, mild in nature. Mild patchy involvement of the pons noted as well. There is a well-circumscribed lentiform lesion measuring 2.5 x 1.1 x 1.8 cm positioned at the right lentiform nucleus/external capsule (series 10, image 12). This corresponds with abnormality on prior CT. Lesion demonstrates hyperintense T1 and T2 signal intensity with a surrounding hemosiderin rim. Smooth rind of mild surrounding enhancement noted. Given appearance and position, finding favored to reflect an evolving subacute intraparenchymal hematoma, possibly reflecting an evolving hemorrhagic infarct. No definite underlying mass lesion is seen. Surrounding T2/FLAIR signal abnormality likely reflects associated vasogenic edema. No significant regional mass effect or midline shift. No visible intraventricular extension or other complication. No other evidence for acute or subacute ischemia. Gray-white matter differentiation otherwise maintained. No encephalomalacia to suggest chronic cortical  infarction elsewhere within the brain. No other foci of susceptibility artifact to suggest acute or chronic intracranial hemorrhage. No other mass lesion or mass effect. No hydrocephalus or extra-axial fluid collection. Pituitary gland suprasellar region within normal limits. Midline structures intact and normal. No other abnormal enhancement. Vascular: Major intracranial vascular flow voids are maintained. Skull and upper cervical spine: Craniocervical junction within normal limits. Bone marrow signal intensity normal. No focal marrow replacing lesion. Soft tissue contusion present at the left and central posterior parieto-occipital scalp. No frank soft tissue hematoma. Sinuses/Orbits: Patient status post bilateral ocular lens replacement with scleral buckling on the right. Globes and orbital soft tissues demonstrate no other acute finding. Scattered mucoperiosteal thickening noted within the ethmoidal air cells and maxillary sinuses. Paranasal sinuses are otherwise clear. Trace effusion noted within the right middle ear cavity. Mastoid air cells are largely clear. Visualized nasopharynx unremarkable. Other: None. MRI CERVICAL SPINE FINDINGS Alignment: Trace grade 1 anterolisthesis of C4 on C5, T1 on T2, T2 on T3, and T3 on T4. Findings chronic and facet mediated. Mild straightening of the normal cervical lordosis elsewhere. Vertebrae: Previously identified acute nondisplaced fracture extending through the left anterior arch and lateral mass of C1 noted, stable in position and alignment from prior CT. No other visible fracture evident by MRI. Vertebral body height maintained. Underlying bone marrow signal intensity within normal limits. No discrete or worrisome osseous lesions. Discogenic reactive endplate change with mild marrow edema noted about the partially visualized T3-4 interspace. Reactive marrow edema about the left C2-3 facet due to facet arthritis (series 8, image 12). No other abnormal marrow edema.  Cord: Normal signal and morphology. No evidence for traumatic cord injury. Ligamentous structures appear intact. Posterior Fossa, vertebral arteries, paraspinal tissues: Craniocervical junction remains widely patent and within normal limits. Paraspinous and prevertebral soft tissues demonstrate no acute finding. Normal flow voids seen within the vertebral arteries bilaterally. Chronic fatty atrophy of both parotid glands noted. Disc levels: C2-C3: Minimal disc bulge. Left greater than right facet hypertrophy, with associated reactive marrow edema and small joint effusion on the left. No spinal stenosis. Foramina remain patent. C3-C4: Degenerative intervertebral disc space narrowing with diffuse disc bulge. Left greater than right facet hypertrophy. No spinal stenosis. Mild bilateral C4 foraminal narrowing. C4-C5: Anterolisthesis. Mild disc bulge with uncovertebral hypertrophy. Bilateral facet degeneration. No significant spinal stenosis. Moderate to severe bilateral C5 foraminal narrowing. C5-C6: Degenerative intervertebral disc space narrowing with diffuse disc osteophyte complex. Posterior component flattens the ventral thecal sac without significant spinal stenosis. Superimposed mild left-sided  facet hypertrophy. Moderate to severe left with moderate right C6 foraminal narrowing. C6-C7: Degenerative intervertebral disc space narrowing with diffuse disc osteophyte complex. No significant spinal stenosis. Mild left C7 foraminal narrowing. Right neural foramina remains patent. C7-T1: Degenerative intervertebral disc space narrowing with diffuse disc osteophyte complex. Mild facet and ligament flavum hypertrophy. No significant spinal stenosis. Foramina remain patent. Visualized upper thoracic spine demonstrates mild multilevel noncompressive disc bulging without significant stenosis. IMPRESSION: MRI HEAD IMPRESSION: 1. 2.5 x 1.1 x 1.8 cm well-circumscribed lenticular lesion involving the right lentiform  nucleus/external capsule, favored to reflect an evolving subacute intraparenchymal hematoma, possibly related to a subacute hemorrhagic infarct. Mild surrounding vasogenic edema without significant regional mass effect. While no definite underlying mass lesion is identified, a short interval follow-up MRI to ensure these changes resolve and no underlying lesion is present is recommended. 2. No other acute intracranial abnormality. 3. Soft tissue contusion at the left and central posterior parieto-occipital scalp. MRI CERVICAL SPINE IMPRESSION: 1. Acute nondisplaced fracture extending through the left anterior arch and lateral mass of C1, stable in position and alignment from prior CT. 2. No other acute traumatic injury within the cervical spine. No evidence for acute cord injury or ligamentous disruption. 3. Multilevel cervical spondylosis without significant spinal stenosis. Moderate to severe bilateral C5 and C6 foraminal narrowing as above. Electronically Signed   By: Jeannine Boga M.D.   On: 10/29/2020 21:34   MR Cervical Spine Wo Contrast  Result Date: 10/29/2020 CLINICAL DATA:  Initial evaluation for brain mass or lesion, fall. EXAM: MRI HEAD WITHOUT AND WITH CONTRAST MRI CERVICAL SPINE WITHOUT AND WITH CONTRAST TECHNIQUE: Multiplanar, multiecho pulse sequences of the brain and surrounding structures, and cervical spine, to include the craniocervical junction and cervicothoracic junction, were obtained without and with intravenous contrast. CONTRAST:  7.77mL GADAVIST GADOBUTROL 1 MMOL/ML IV SOLN COMPARISON:  Prior CTs from earlier the same day. FINDINGS: MRI HEAD FINDINGS Brain: Cerebral volume within normal limits for age. Patchy T2/FLAIR hyperintensity involving the periventricular and deep white matter both cerebral hemispheres noted, most consistent with chronic small vessel ischemic disease, mild in nature. Mild patchy involvement of the pons noted as well. There is a well-circumscribed  lentiform lesion measuring 2.5 x 1.1 x 1.8 cm positioned at the right lentiform nucleus/external capsule (series 10, image 12). This corresponds with abnormality on prior CT. Lesion demonstrates hyperintense T1 and T2 signal intensity with a surrounding hemosiderin rim. Smooth rind of mild surrounding enhancement noted. Given appearance and position, finding favored to reflect an evolving subacute intraparenchymal hematoma, possibly reflecting an evolving hemorrhagic infarct. No definite underlying mass lesion is seen. Surrounding T2/FLAIR signal abnormality likely reflects associated vasogenic edema. No significant regional mass effect or midline shift. No visible intraventricular extension or other complication. No other evidence for acute or subacute ischemia. Gray-white matter differentiation otherwise maintained. No encephalomalacia to suggest chronic cortical infarction elsewhere within the brain. No other foci of susceptibility artifact to suggest acute or chronic intracranial hemorrhage. No other mass lesion or mass effect. No hydrocephalus or extra-axial fluid collection. Pituitary gland suprasellar region within normal limits. Midline structures intact and normal. No other abnormal enhancement. Vascular: Major intracranial vascular flow voids are maintained. Skull and upper cervical spine: Craniocervical junction within normal limits. Bone marrow signal intensity normal. No focal marrow replacing lesion. Soft tissue contusion present at the left and central posterior parieto-occipital scalp. No frank soft tissue hematoma. Sinuses/Orbits: Patient status post bilateral ocular lens replacement with scleral buckling on the right. Globes and  orbital soft tissues demonstrate no other acute finding. Scattered mucoperiosteal thickening noted within the ethmoidal air cells and maxillary sinuses. Paranasal sinuses are otherwise clear. Trace effusion noted within the right middle ear cavity. Mastoid air cells are  largely clear. Visualized nasopharynx unremarkable. Other: None. MRI CERVICAL SPINE FINDINGS Alignment: Trace grade 1 anterolisthesis of C4 on C5, T1 on T2, T2 on T3, and T3 on T4. Findings chronic and facet mediated. Mild straightening of the normal cervical lordosis elsewhere. Vertebrae: Previously identified acute nondisplaced fracture extending through the left anterior arch and lateral mass of C1 noted, stable in position and alignment from prior CT. No other visible fracture evident by MRI. Vertebral body height maintained. Underlying bone marrow signal intensity within normal limits. No discrete or worrisome osseous lesions. Discogenic reactive endplate change with mild marrow edema noted about the partially visualized T3-4 interspace. Reactive marrow edema about the left C2-3 facet due to facet arthritis (series 8, image 12). No other abnormal marrow edema. Cord: Normal signal and morphology. No evidence for traumatic cord injury. Ligamentous structures appear intact. Posterior Fossa, vertebral arteries, paraspinal tissues: Craniocervical junction remains widely patent and within normal limits. Paraspinous and prevertebral soft tissues demonstrate no acute finding. Normal flow voids seen within the vertebral arteries bilaterally. Chronic fatty atrophy of both parotid glands noted. Disc levels: C2-C3: Minimal disc bulge. Left greater than right facet hypertrophy, with associated reactive marrow edema and small joint effusion on the left. No spinal stenosis. Foramina remain patent. C3-C4: Degenerative intervertebral disc space narrowing with diffuse disc bulge. Left greater than right facet hypertrophy. No spinal stenosis. Mild bilateral C4 foraminal narrowing. C4-C5: Anterolisthesis. Mild disc bulge with uncovertebral hypertrophy. Bilateral facet degeneration. No significant spinal stenosis. Moderate to severe bilateral C5 foraminal narrowing. C5-C6: Degenerative intervertebral disc space narrowing with  diffuse disc osteophyte complex. Posterior component flattens the ventral thecal sac without significant spinal stenosis. Superimposed mild left-sided facet hypertrophy. Moderate to severe left with moderate right C6 foraminal narrowing. C6-C7: Degenerative intervertebral disc space narrowing with diffuse disc osteophyte complex. No significant spinal stenosis. Mild left C7 foraminal narrowing. Right neural foramina remains patent. C7-T1: Degenerative intervertebral disc space narrowing with diffuse disc osteophyte complex. Mild facet and ligament flavum hypertrophy. No significant spinal stenosis. Foramina remain patent. Visualized upper thoracic spine demonstrates mild multilevel noncompressive disc bulging without significant stenosis. IMPRESSION: MRI HEAD IMPRESSION: 1. 2.5 x 1.1 x 1.8 cm well-circumscribed lenticular lesion involving the right lentiform nucleus/external capsule, favored to reflect an evolving subacute intraparenchymal hematoma, possibly related to a subacute hemorrhagic infarct. Mild surrounding vasogenic edema without significant regional mass effect. While no definite underlying mass lesion is identified, a short interval follow-up MRI to ensure these changes resolve and no underlying lesion is present is recommended. 2. No other acute intracranial abnormality. 3. Soft tissue contusion at the left and central posterior parieto-occipital scalp. MRI CERVICAL SPINE IMPRESSION: 1. Acute nondisplaced fracture extending through the left anterior arch and lateral mass of C1, stable in position and alignment from prior CT. 2. No other acute traumatic injury within the cervical spine. No evidence for acute cord injury or ligamentous disruption. 3. Multilevel cervical spondylosis without significant spinal stenosis. Moderate to severe bilateral C5 and C6 foraminal narrowing as above. Electronically Signed   By: Jeannine Boga M.D.   On: 10/29/2020 21:34   CT ABDOMEN PELVIS W CONTRAST  Result  Date: 10/29/2020 CLINICAL DATA:  Pulmonary embolus suspected with high probability. Recent fall. Chronic back and right hip pain. EXAM: CT ANGIOGRAPHY  CHEST CT ABDOMEN AND PELVIS WITH CONTRAST TECHNIQUE: Multidetector CT imaging of the chest was performed using the standard protocol during bolus administration of intravenous contrast. Multiplanar CT image reconstructions and MIPs were obtained to evaluate the vascular anatomy. Multidetector CT imaging of the abdomen and pelvis was performed using the standard protocol during bolus administration of intravenous contrast. CONTRAST:  74mL OMNIPAQUE IOHEXOL 350 MG/ML SOLN COMPARISON:  CT abdomen and pelvis 10/26/2020 FINDINGS: CTA CHEST FINDINGS Cardiovascular: Good opacification of the central and segmental pulmonary arteries. No focal filling defects. No evidence of significant pulmonary embolus. Cardiac enlargement. No pericardial effusions. Mild coronary artery and aortic calcifications. No aortic dissection. Great vessel origins are patent. Mediastinum/Nodes: Esophagus is decompressed. No significant lymphadenopathy in the chest. Thyroid gland is unremarkable. Lungs/Pleura: Mild dependent changes in the lung bases. No airspace disease or consolidation. No pleural effusions. No pneumothorax. Musculoskeletal: Degenerative changes in the spine and shoulders. No destructive bone lesions identified. Review of the MIP images confirms the above findings. CT ABDOMEN and PELVIS FINDINGS Hepatobiliary: Mild diffuse fatty infiltration of the liver. No focal lesions identified. Surgical absence of the gallbladder. No bile duct dilatation. Pancreas: Unremarkable. No pancreatic ductal dilatation or surrounding inflammatory changes. Spleen: Normal in size without focal abnormality. Adrenals/Urinary Tract: Adrenal glands are unremarkable. Kidneys are normal, without renal calculi, focal lesion, or hydronephrosis. Bladder is unremarkable. Stomach/Bowel: Stomach, small bowel, and  colon are not abnormally distended. Prominent submucosal fat in the right colon is likely related to obesity. Diverticulosis of the sigmoid colon. Stranding has improved since prior study without significant residual inflammatory change. Appendix is normal. Vascular/Lymphatic: Aortic atherosclerosis. No enlarged abdominal or pelvic lymph nodes. Reproductive: Status post hysterectomy. No adnexal masses. Other: No free air or free fluid in the abdomen. Postoperative changes in the anterior abdominal wall consistent with mesh hernia repair. No residual or recurrent hernia is identified. Musculoskeletal: Mild lumbar scoliosis convex towards the right. Degenerative changes in the spine and hips. No destructive bone lesions. Review of the MIP images confirms the above findings. IMPRESSION: 1. No evidence of significant pulmonary embolus. 2. No evidence of active pulmonary disease. 3. No acute process demonstrated in the abdomen or pelvis. Electronically Signed   By: Lucienne Capers M.D.   On: 10/29/2020 21:36   CT T-SPINE NO CHARGE  Result Date: 10/29/2020 CLINICAL DATA:  Patient fell, striking head. Chronic back pain and right hip pain. EXAM: CT Thoracic and Lumbar spine with contrast TECHNIQUE: Multiplanar CT images of the thoracic and lumbar spine were reconstructed from contemporary CT of the Chest, Abdomen, and Pelvis CONTRAST:  No additional COMPARISON:  CT abdomen and pelvis and MRI lumbar spine 10/26/2020 FINDINGS: CT THORACIC SPINE FINDINGS Alignment: Mild thoracic scoliosis, convex towards the right. No anterior subluxations. Vertebrae: No vertebral compression deformities. No focal bone lesion or bone destruction. Bone cortex appears intact. Paraspinal and other soft tissues: No paraspinal soft tissue infiltration or mass. Disc levels: Degenerative changes throughout the thoracic spine with narrowed interspaces and endplate osteophyte formation. Bridging anterior osteophytes throughout. No evidence of  severe bony encroachment upon the central canal. CT LUMBAR SPINE FINDINGS Segmentation: 5 lumbar type vertebrae. Alignment: Mild lumbar scoliosis convex towards the left. No anterior subluxations. Vertebrae: Schmorl's node at the inferior endplate of L2, unchanged. No acute vertebral compression deformities. No focal bone lesions or bone destruction. Bone cortex appears intact. Paraspinal and other soft tissues: Paraspinal soft tissues appear intact. No evidence of paraspinal mass or soft tissue infiltration. Disc levels: Degenerative changes throughout  the lumbar spine with narrowed interspaces and endplate osteophyte formation. Degenerative changes in the facet joints. Moderate central canal stenosis is suggested at L3-4 and L4-5 levels, caused by combination of osteophyte formation and disc bulging. IMPRESSION: 1. Mild thoracolumbar scoliosis with thoracic spine convex towards the right and lumbar spine towards the left. 2. No anterior subluxations. 3. No acute fractures or dislocation identified. 4. Diffuse degenerative changes. Electronically Signed   By: Lucienne Capers M.D.   On: 10/29/2020 21:45   CT L-SPINE NO CHARGE  Result Date: 10/29/2020 CLINICAL DATA:  Patient fell, striking head. Chronic back pain and right hip pain. EXAM: CT Thoracic and Lumbar spine with contrast TECHNIQUE: Multiplanar CT images of the thoracic and lumbar spine were reconstructed from contemporary CT of the Chest, Abdomen, and Pelvis CONTRAST:  No additional COMPARISON:  CT abdomen and pelvis and MRI lumbar spine 10/26/2020 FINDINGS: CT THORACIC SPINE FINDINGS Alignment: Mild thoracic scoliosis, convex towards the right. No anterior subluxations. Vertebrae: No vertebral compression deformities. No focal bone lesion or bone destruction. Bone cortex appears intact. Paraspinal and other soft tissues: No paraspinal soft tissue infiltration or mass. Disc levels: Degenerative changes throughout the thoracic spine with narrowed  interspaces and endplate osteophyte formation. Bridging anterior osteophytes throughout. No evidence of severe bony encroachment upon the central canal. CT LUMBAR SPINE FINDINGS Segmentation: 5 lumbar type vertebrae. Alignment: Mild lumbar scoliosis convex towards the left. No anterior subluxations. Vertebrae: Schmorl's node at the inferior endplate of L2, unchanged. No acute vertebral compression deformities. No focal bone lesions or bone destruction. Bone cortex appears intact. Paraspinal and other soft tissues: Paraspinal soft tissues appear intact. No evidence of paraspinal mass or soft tissue infiltration. Disc levels: Degenerative changes throughout the lumbar spine with narrowed interspaces and endplate osteophyte formation. Degenerative changes in the facet joints. Moderate central canal stenosis is suggested at L3-4 and L4-5 levels, caused by combination of osteophyte formation and disc bulging. IMPRESSION: 1. Mild thoracolumbar scoliosis with thoracic spine convex towards the right and lumbar spine towards the left. 2. No anterior subluxations. 3. No acute fractures or dislocation identified. 4. Diffuse degenerative changes. Electronically Signed   By: Lucienne Capers M.D.   On: 10/29/2020 21:45        Scheduled Meds:   stroke: mapping our early stages of recovery book   Does not apply Once   doxazosin  2 mg Oral QHS   gabapentin  100 mg Oral BID   losartan  100 mg Oral Daily   And   hydrochlorothiazide  25 mg Oral Daily   potassium chloride  40 mEq Oral Once   venlafaxine  37.5 mg Oral BID   Continuous Infusions:  ondansetron (ZOFRAN) IV Stopped (10/30/20 1159)     LOS: 2 days    Time spent: 30 mins     Wyvonnia Dusky, MD [facility] Pager [phone]  If 7PM-7AM, please contact night-coverage 10/31/2020, 8:08 AM

## 2020-10-31 NOTE — ED Notes (Signed)
Pt unable to get comfortable. Provided 3 pillow to pt and placed under back and head. Pt states a little relief but will use call bell when needed to be resituated. Son at bedside. No other needs requested at thsi time.

## 2020-10-31 NOTE — ED Notes (Signed)
Patient denies pain and is resting comfortably.  

## 2020-10-31 NOTE — Evaluation (Signed)
Physical Therapy Evaluation Patient Details Name: [name] MRN: [id number] DOB: Nov 12, 1940 Today's Date: 10/31/2020  History of Present Illness  80 y/o female with PMH significant for DM, HTN and arthritis.  Pt came in after a fall; apparently she was going to the MD and son was using w/c to get her down the steps when she tipped back and hit her head.  Imaging reveals C1 body fracture as well as possible hemorrhagic stroke.  Pt had Covid ~2 months prior to admission and apparently has been very weak (nearly w/c bound) and not able to eat in that time.  Clinical Impression  Pt pleasant and eager to work with PT but ultimately was functional quite limited.  Apparently she was relatively activity and independent a few months ago but has been essentially w/c bound and unable to eat for the last 2 months.  Pt reports some confusion/difficult processing (eg while using tablet) but does not report overt asymmetry with strength, sensation, coordination testing.  She was able to tolerate sitting EOB and standing ~1 minute but after just 2-3 steps she started to feel lightheaded and like her knees were going to buckle; heavily assisted back to bed.  BP was 105/65, 1-2 minutes later after we were safely back in supine, nursing notified of possible syncopal response to being up.    Recommendations for follow up therapy are one component of a multi-disciplinary discharge planning process, led by the attending physician.  Recommendations may be updated based on patient status, additional functional criteria and insurance authorization.  Follow Up Recommendations SNF;Supervision/Assistance - 24 hour    Equipment Recommendations   (TBD at rehab)    Recommendations for Other Services       Precautions / Restrictions Precautions Precautions: Fall Required Braces or Orthoses: Cervical Brace Restrictions Weight Bearing Restrictions: No      Mobility  Bed Mobility Overal bed mobility: Needs  Assistance Bed Mobility: Supine to Sit;Sit to Supine     Supine to sit: Min assist Sit to supine: Mod assist;Max assist   General bed mobility comments: Pt showed good effort in getting to sitting EOB.  Did need direct assist to lift torso and adjust at EOB. On return to bed she was feeling very poorly and needed signficant assistance to get LEs back up in bed and safely lay back to supine    Transfers Overall transfer level: Needs assistance Equipment used: Rolling walker (2 wheeled) Transfers: Sit to/from Stand Sit to Stand: Min assist         General transfer comment: Pt was able to give most of the effort needed to get up to standing.  Cuing and guidance to insure she stayed safe, in the walker and on task  Ambulation/Gait Ambulation/Gait assistance: Max assist Gait Distance (Feet): 3 Feet Assistive device: Rolling walker (2 wheeled)       General Gait Details: Pt was able to take just a few labored/guarded steps with heavy walker use when she started to feel dizzy and then like she would pass out (this after sitting at EOB for a few minutes and in standing before attempt at ambulation for ~1 minute). Heavily assisted to get back to bed and ultimately down to supine.  Pt with very poor tolerance for ambulation at this time.  Stairs            Wheelchair Mobility    Modified Rankin (Stroke Patients Only)       Balance Overall balance assessment: Needs assistance Sitting-balance support:  Single extremity supported Sitting balance-Leahy Scale: Fair Sitting balance - Comments: Once assisted to sitting EOB pt was able to maintain balance     Standing balance-Leahy Scale: Poor Standing balance comment: Pt with possibly syncopal episode after standing ~1 minute this session, history of multiple falls, very weak and overall poor functional balance                             Pertinent Vitals/Pain Pain Assessment: Faces Faces Pain Scale: Hurts little  more Pain Location: laceration on back of head is the biggest c/o pain    Home Living Family/patient expects to be discharged to:: Skilled nursing facility                 Additional Comments: Pt lives with son,    Prior Function Level of Independence: Needs assistance   Gait / Transfers Assistance Needed: reports since Covid (2 mos) she has been almost w/c bound with only limited short bouts of ambulation in the home  ADL's / Homemaking Assistance Needed: son helps with errands, ADLs, etc  Comments: Apparently prior to 2 months ago pt was out of the home with some regularity and able to be relatively independent     Hand Dominance        Extremity/Trunk Assessment   Upper Extremity Assessment Upper Extremity Assessment: Generalized weakness (grossly 3+/5 t/o, lacks shld elevation >80 b/l, strength defecits appear roughly equal bilaterally)    Lower Extremity Assessment Lower Extremity Assessment:  (chronic neuropathy b/l, reports no change from basline.  General weakness, grossly 4-/5 with again no significant obvious difference R vs L)       Communication   Communication: No difficulties  Cognition Arousal/Alertness: Awake/alert Behavior During Therapy: WFL for tasks assessed/performed Overall Cognitive Status: Within Functional Limits for tasks assessed                                        General Comments General comments (skin integrity, edema, etc.): Pt reports she has been dealing with terrible vomiting &/or diarrhea every time she eats (since having Covid ~2 months ago) and reports felling excessively weak    Exercises     Assessment/Plan    PT Assessment Patient needs continued PT services  PT Problem List Decreased strength;Decreased range of motion;Decreased activity tolerance;Decreased balance;Decreased mobility;Decreased coordination;Decreased knowledge of use of DME;Decreased safety awareness       PT Treatment Interventions  DME instruction;Gait training;Stair training;Functional mobility training;Therapeutic activities;Therapeutic exercise;Balance training;Neuromuscular re-education;Cognitive remediation;Patient/family education    PT Goals (Current goals can be found in the Care Plan section)  Acute Rehab PT Goals Patient Stated Goal: be able to eat something PT Goal Formulation: With patient Time For Goal Achievement: 11/14/20 Potential to Achieve Goals: Fair    Frequency Min 2X/week   Barriers to discharge        Co-evaluation               AM-PAC PT "6 Clicks" Mobility  Outcome Measure Help needed turning from your back to your side while in a flat bed without using bedrails?: A Little Help needed moving from lying on your back to sitting on the side of a flat bed without using bedrails?: A Lot Help needed moving to and from a bed to a chair (including a wheelchair)?: A Little Help needed standing up from  a chair using your arms (e.g., wheelchair or bedside chair)?: A Little Help needed to walk in hospital room?: A Lot Help needed climbing 3-5 steps with a railing? : Total 6 Click Score: 14    End of Session Equipment Utilized During Treatment: Gait belt Activity Tolerance: Patient limited by fatigue Patient left: in bed;with call bell/phone within reach Nurse Communication: Mobility status PT Visit Diagnosis: Muscle weakness (generalized) (M62.81);Difficulty in walking, not elsewhere classified (R26.2);Unsteadiness on feet (R26.81);Dizziness and giddiness (R42);Other symptoms and signs involving the nervous system (R29.898)    Time: 7871-8367 PT Time Calculation (min) (ACUTE ONLY): 26 min   Charges:   PT Evaluation $PT Eval Low Complexity: 1 Low PT Treatments $Therapeutic Activity: 8-22 mins        Kreg Shropshire, DPT 10/31/2020, 1:33 PM

## 2020-11-01 ENCOUNTER — Encounter: Payer: Self-pay | Admitting: Internal Medicine

## 2020-11-01 DIAGNOSIS — I619 Nontraumatic intracerebral hemorrhage, unspecified: Secondary | ICD-10-CM | POA: Diagnosis not present

## 2020-11-01 DIAGNOSIS — S12001A Unspecified nondisplaced fracture of first cervical vertebra, initial encounter for closed fracture: Secondary | ICD-10-CM | POA: Diagnosis not present

## 2020-11-01 DIAGNOSIS — R197 Diarrhea, unspecified: Secondary | ICD-10-CM | POA: Diagnosis not present

## 2020-11-01 LAB — CBC
HCT: 30 % — ABNORMAL LOW (ref 36.0–46.0)
Hemoglobin: 10.2 g/dL — ABNORMAL LOW (ref 12.0–15.0)
MCH: 32.2 pg (ref 26.0–34.0)
MCHC: 34 g/dL (ref 30.0–36.0)
MCV: 94.6 fL (ref 80.0–100.0)
Platelets: 173 10*3/uL (ref 150–400)
RBC: 3.17 MIL/uL — ABNORMAL LOW (ref 3.87–5.11)
RDW: 14.1 % (ref 11.5–15.5)
WBC: 4.7 10*3/uL (ref 4.0–10.5)
nRBC: 0 % (ref 0.0–0.2)

## 2020-11-01 LAB — BASIC METABOLIC PANEL
Anion gap: 12 (ref 5–15)
BUN: 22 mg/dL (ref 8–23)
CO2: 23 mmol/L (ref 22–32)
Calcium: 8.5 mg/dL — ABNORMAL LOW (ref 8.9–10.3)
Chloride: 100 mmol/L (ref 98–111)
Creatinine, Ser: 1.4 mg/dL — ABNORMAL HIGH (ref 0.44–1.00)
GFR, Estimated: 38 mL/min — ABNORMAL LOW (ref >60)
Glucose, Bld: 168 mg/dL — ABNORMAL HIGH (ref 70–99)
Potassium: 3.5 mmol/L (ref 3.5–5.1)
Sodium: 135 mmol/L (ref 135–145)

## 2020-11-01 LAB — CBG MONITORING, ED
Glucose-Capillary: 169 mg/dL — ABNORMAL HIGH (ref 70–99)
Glucose-Capillary: 169 mg/dL — ABNORMAL HIGH (ref 70–99)
Glucose-Capillary: 156 mg/dL — ABNORMAL HIGH (ref 70–99)
Glucose-Capillary: 192 mg/dL — ABNORMAL HIGH (ref 70–99)

## 2020-11-01 LAB — MAGNESIUM: Magnesium: 1.8 mg/dL (ref 1.7–2.4)

## 2020-11-01 LAB — GLUCOSE, CAPILLARY: Glucose-Capillary: 162 mg/dL — ABNORMAL HIGH (ref 70–99)

## 2020-11-01 LAB — MRSA NEXT GEN BY PCR, NASAL: MRSA by PCR Next Gen: NOT DETECTED

## 2020-11-01 MED ORDER — SODIUM CHLORIDE 0.9 % IV BOLUS
1000.0000 mL | Freq: Once | INTRAVENOUS | Status: AC
  Administered 2020-11-01: 1000 mL via INTRAVENOUS

## 2020-11-01 MED ORDER — SODIUM CHLORIDE 0.9 % IV SOLN: INTRAVENOUS | Status: DC

## 2020-11-01 MED ORDER — CHLORHEXIDINE GLUCONATE CLOTH 2 % EX PADS
6.0000 | MEDICATED_PAD | Freq: Every day | CUTANEOUS | Status: DC
  Administered 2020-11-02 – 2020-11-07 (×6): 6 via TOPICAL

## 2020-11-01 NOTE — ED Notes (Signed)
Pt resting comfortably at this time. Normal rise and fall of chest. NAD noted. Call bell in reach.

## 2020-11-01 NOTE — ED Notes (Signed)
Pt moved to hospital bed, c-spine protected.   Pt states she is in pain, PRN oxy to be given

## 2020-11-01 NOTE — Consult Note (Signed)
Lucilla Lame, MD [facility]  [address] [place], [place] [address] Phone: [phone] Fax : [phone]  Consultation  Referring Provider:     Dr. Jimmye Norman Primary Care Physician:  Venia Carbon, MD Primary Gastroenterologist:  Dr. Vicente Males         Reason for Consultation:     Diarrhea  Date of Admission:  10/29/2020 Date of Consultation:  11/01/2020         HPI:   [name] is a 80 y.o. female Who reports having diarrhea for the last few months.  The patient had been on her way to see Dr. Vicente Males for her continued diarrhea and on the way to his office she had fallen down the stairs.  The patient was diagnosed with a subacute hemorrhagic infarct on a brain MRI and also had a C1 fracture secondary to the recent fall.  The patient states that she has diarrhea all day long when she eats but reports that since she has been the hospital her diarrhea has been better with her only having one bowel movement today when I had seen her.  The patient had a GI panel and a C. Difficile which was reported as negative.  The patient is now taking Imodium when necessary.  I'm now being asked to see this patient for her chronic diarrhea.  The patient does report that she has had some rectal bleeding with the diarrhea but it has been a small amount. Her last colonoscopy was in 2009 by Dr. Fuller Plan in [place].  The patient does report that she had a course of Augmentin which she reports made her diarrhea worse.  Past Medical History:  Diagnosis Date  . Allergic rhinitis   . Anal fissure   . Arthritis   . Asthma   . Depression   . Diabetes mellitus    type 2  . Diverticulosis   . Fibromyalgia   . GERD (gastroesophageal reflux disease)   . Hypertension   . Obesity   . Osteoarthritis     Past Surgical History:  Procedure Laterality Date  . ABDOMINAL HYSTERECTOMY  1970's  . CATARACT EXTRACTION, BILATERAL    . CHOLECYSTECTOMY  4/06  . TUBAL LIGATION  1970's  . VENTRAL HERNIA REPAIR  9/08    [place]    Prior to Admission medications   Medication Sig Start Date End Date Taking? Authorizing Provider  HYDROcodone-acetaminophen (NORCO) 5-325 MG tablet Take 1 tablet by mouth at bedtime. 10/26/20  Yes Merlyn Lot, MD  hyoscyamine (LEVSIN) 0.125 MG tablet Take 1 tablet (0.125 mg total) by mouth 3 (three) times daily as needed. Before meals. Don't use imodium also 10/16/20  Yes [name] I, MD  lidocaine (LIDODERM) 5 % Place 1 patch onto the skin every 12 (twelve) hours. Remove & Discard patch within 12 hours or as directed by MD 10/26/20 10/26/21 Yes Merlyn Lot, MD  ondansetron (ZOFRAN ODT) 4 MG disintegrating tablet Take 1 tablet (4 mg total) by mouth every 6 (six) hours as needed for nausea or vomiting. 10/27/20  Yes Ward, Delice Bison, DO  oxyCODONE-acetaminophen (PERCOCET/ROXICET) 5-325 MG tablet Take 1 tablet by mouth every 6 (six) hours as needed. 10/27/20  Yes Ward, Delice Bison, DO  Accu-Chek Softclix Lancets lancets Use to obtain blood sugar sample Dx Code E11.40 10/16/20   Venia Carbon, MD  aspirin 81 MG tablet Take 81 mg by mouth daily. Patient not taking: No sig reported    [provider]  Cyanocobalamin (  VITAMIN B12 PO) Take by mouth. Patient not taking: No sig reported    [provider]  doxazosin (CARDURA) 4 MG tablet TAKE 1/2 TABLET TWICE DAILY Patient not taking: No sig reported 12/12/19   Venia Carbon, MD  FeFum-FePoly-FA-B Cmp-C-Biot (INTEGRA PLUS) CAPS Take 1 capsule by mouth every morning. Patient not taking: No sig reported 09/19/14   Curt Bears, MD  gabapentin (NEURONTIN) 100 MG capsule TAKE 1 CAPSULE TWICE DAILY Patient not taking: No sig reported 12/12/19   [name] I, MD  glipiZIDE (GLUCOTROL XL) 10 MG 24 hr tablet TAKE 1 TABLET TWICE DAILY Patient not taking: No sig reported 01/18/20   Venia Carbon, MD  Glucosamine-Chondroitin (COSAMIN DS PO) Take by mouth. Patient not taking: No sig reported    [provider]  glucose blood (ACCU-CHEK GUIDE) test strip Use to check blood sugar once daily Dx Code E11.40 10/16/20   Venia Carbon, MD  losartan-hydrochlorothiazide (HYZAAR) 100-25 MG tablet TAKE 1 TABLET EVERY DAY Patient not taking: No sig reported 05/23/20   Venia Carbon, MD  metFORMIN (GLUCOPHAGE-XR) 750 MG 24 hr tablet TAKE 2 TABLETS EVERY DAY WITH BREAKFAST Patient not taking: No sig reported 01/18/20   Venia Carbon, MD  Multiple Vitamin (MULTIVITAMIN) tablet Take 1 tablet by mouth daily. Patient not taking: No sig reported    [provider]  OVER THE COUNTER MEDICATION Knock Out All Natural Sleep Aid Patient not taking: No sig reported    [provider]  potassium chloride SA (KLOR-CON) 20 MEQ tablet Take 1 tablet (20 mEq total) by mouth 2 (two) times daily for 5 days. Patient not taking: No sig reported 10/25/20 10/30/20  Lavonia Drafts, MD  tiZANidine (ZANAFLEX) 4 MG tablet TAKE 1 TABLET AT BEDTIME Patient not taking: No sig reported 06/05/20   Venia Carbon, MD  venlafaxine (EFFEXOR) 37.5 MG tablet TAKE 1 TABLET TWICE DAILY Patient not taking: No sig reported 12/12/19   Venia Carbon, MD    Family History  Problem Relation Age of Onset  . Heart attack Father   . Coronary artery disease Father   . Kidney cancer Mother   . Lung cancer Brother   . Liver cancer Brother   . Diabetes Other        Maternal side  . Brain cancer Daughter   . Hypertension Neg Hx   . Breast cancer Neg Hx   . Colon cancer Neg Hx      Social History   Tobacco Use  . Smoking status: Never  . Smokeless tobacco: Never  Vaping Use  . Vaping Use: Never used  Substance Use Topics  . Alcohol use: No    Alcohol/week: 0.0 standard drinks  . Drug use: No    Allergies as of 10/29/2020 - Review Complete 10/29/2020  Allergen Reaction Noted  . Diphenhydramine hcl Other (See Comments) 01/09/2015  . Fluoxetine hcl  06/20/2007  . Lisinopril  05/07/2006  .  Sertraline hcl  07/25/2007    Review of Systems:    All systems reviewed and negative except where noted in HPI.   Physical Exam:  Vital signs in last 24 hours: Pulse Rate:  [66-87] 72 (09/23 1830) Resp:  [10-20] 12 (09/23 1830) BP: (89-142)/(40-91) 113/50 (09/23 1830) SpO2:  [90 %-99 %] 93 % (09/23 1830)   General:   Pleasant, cooperative in NAD Head:  Normocephalic and Positive c-collar around the patient's neck. Eyes:   No icterus.   Conjunctiva  pink. PERRLA. Ears:  Normal auditory acuity. Neck:  Patient is wearing a c-collar Lungs: Respirations even and unlabored. Lungs clear to auscultation bilaterally.   No wheezes, crackles, or rhonchi.  Heart:  Regular rate and rhythm;  Without murmur, clicks, rubs or gallops Abdomen:  Soft, nondistended, nontender. Normal bowel sounds. No appreciable masses or hepatomegaly.  No rebound or guarding.  Rectal:  Not performed. Msk:  Symmetrical without gross deformities.    Extremities:  Without edema, cyanosis or clubbing. Neurologic:  Alert and oriented x3;  grossly normal neurologically. Skin:  Intact without significant lesions or rashes. Cervical Nodes:  No significant cervical adenopathy. Psych:  Alert and cooperative. Normal affect.  LAB RESULTS: Recent Labs    10/30/20 0503 10/31/20 0703 11/01/20 0753  WBC 6.3 5.3 4.7  HGB 10.6* 10.3* 10.2*  HCT 31.1* 29.6* 30.0*  PLT 200 188 173   BMET Recent Labs    10/30/20 0503 10/31/20 0703 11/01/20 0753  NA 137 135 135  K 3.2* 3.5 3.5  CL 104 99 100  CO2 22 20* 23  GLUCOSE 170* 206* 168*  BUN 14 19 22   CREATININE 1.06* 1.14* 1.40*  CALCIUM 7.9* 8.5* 8.5*   LFT No results for input(s): PROT, ALBUMIN, AST, ALT, ALKPHOS, BILITOT, BILIDIR, IBILI in the last 72 hours. PT/INR No results for input(s): LABPROT, INR in the last 72 hours.  STUDIES: CT ANGIO HEAD NECK W WO CM  Result Date: 10/31/2020 CLINICAL DATA:  Stroke, follow up Follow up study. She is not currently a code  stroke EXAM: CT ANGIOGRAPHY HEAD AND NECK TECHNIQUE: Multidetector CT imaging of the head and neck was performed using the standard protocol during bolus administration of intravenous contrast. Multiplanar CT image reconstructions and MIPs were obtained to evaluate the vascular anatomy. Carotid stenosis measurements (when applicable) are obtained utilizing NASCET criteria, using the distal internal carotid diameter as the denominator. CONTRAST:  73mL OMNIPAQUE IOHEXOL 350 MG/ML SOLN COMPARISON:  10/29/2020. FINDINGS: CT HEAD FINDINGS Brain: Similar appearance of the suspected evolving hemorrhage in the right external capsule, better characterized on recent MRI. No progressive mass effect. Similar mild associated hyperdensity, likely representing evolving blood products. No evidence of interval acute large vascular territory infarct or new acute hemorrhage. No hydrocephalus or extra-axial fluid collection. Vascular: See below. Skull: No acute fracture. Sinuses: Clear visualized sinuses. Orbits: Postoperative changes of the right globe. No acute orbital findings. Review of the MIP images confirms the above findings CTA NECK FINDINGS Aortic arch: Atherosclerosis.  Great vessel origins are patent. Right carotid system: Atherosclerosis at the carotid bifurcation without greater than 50% stenosis. Left carotid system: Mild atherosclerosis at the carotid bifurcation without greater than 50% stenosis. Vertebral arteries: Right dominant. No significant (greater than 50%) stenosis. Skeleton: Severe degenerative disease in the lower cervical spine. Severe multilevel facet arthropathy with degenerative anterolisthesis of C4 on C5. Other neck: No acute abnormality. Small (5 mm) nodule within the right thyroid. No follow-up imaging is recommended. Reference: J Am Coll Radiol. 2015 Feb;12(2): 143-50. Upper chest: Motion limited evaluation without definite consolidation in the visualized lung apices. Review of the MIP images  confirms the above findings CTA HEAD FINDINGS Anterior circulation: Bilateral intracranial ICA calcific atherosclerosis with mild narrowing. Bilateral MCAs and ACAs are patent without proximal hemodynamically significant stenosis. No evidence of a vascular malformation in the region of the suspected evolving right external capsule hematoma, although blood products limits evaluation. No aneurysm identified. Posterior circulation: Right dominant intradural vertebral arteries. Mild atherosclerotic narrowing of the  proximal right intradural vertebral artery. Basilar artery and bilateral posterior cerebral arteries are patent without significant proximal stenosis. No aneurysm identified. Venous sinuses: As permitted by contrast timing, patent. Review of the MIP images confirms the above findings IMPRESSION: 1. Similar appearance of the suspected evolving right external capsule hematoma, better characterized on recent MRI. No progressive mass effect or new/interval acute abnormality. 2. No large vessel occlusion or proximal hemodynamically significant stenosis in the head or neck. 3. No evidence of a vascular malformation in the region of the intraparenchymal hematoma, although blood products limits evaluation. Electronically Signed   By: Margaretha Sheffield M.D.   On: 10/31/2020 09:29      Impression / Plan:   Assessment: Active Problems:   Essential hypertension, benign   GERD   Fibromyalgia   Osteoarthritis, knee   Type 2 diabetes, uncontrolled, with neuropathy (HCC)   Iron deficiency anemia   Stage 3b chronic kidney disease ([place])   Hemorrhagic stroke ([place])   Cerebral brain hemorrhage (HCC)   [name] is a 80 y.o. y/o female with Who has had chronic diarrhea with weight loss and was supposed to see GI but fell down the stairs on the way to the appointment.  The patient has been found to have a subacute hemorrhagic infarct of the brain and has a C1 neck fracture.   Plan:  This patient is in no  condition to undergo any endoscopic workup for her diarrhea.  The differential diagnosis includes microscopic colitis versus ulcerative colitis and infection is much less likely with her negative workup so far.  Irritable bowel syndrome with postprandial diarrhea can also be a possibility here.  The patient has been explained that with her recent stroke and her neck injury, now would not be the best time for her to undergo any endoscopic procedures.  She has been told of the risks of the procedures greatly outweigh the benefit since she has been having this for some time.  The patient has been explained the plan and agrees with it.  There is no intention to do any GI workup while she is an inpatient.  I will sign off.  Please call if any further GI concerns or questions.  We would like to thank you for the opportunity to participate in the care of KATLYNNE MCKERCHER.   Thank you for involving me in the care of this patient.      LOS: 3 days   Lucilla Lame, MD, [facility] 11/01/2020, 6:53 PM,  Pager [phone] 7am-5pm  Check AMION for 5pm -7am coverage and on weekends   Note: This dictation was prepared with Dragon dictation along with smaller phrase technology. Any transcriptional errors that result from this process are unintentional.

## 2020-11-01 NOTE — Progress Notes (Signed)
PROGRESS NOTE    [name]  XBD:[id number] DOB: September 28, 1940 DOA: 10/29/2020 PCP: Venia Carbon, MD   Assessment & Plan:   Active Problems:   Essential hypertension, benign   GERD   Fibromyalgia   Osteoarthritis, knee   Type 2 diabetes, uncontrolled, with neuropathy (HCC)   Iron deficiency anemia   Stage 3b chronic kidney disease (HCC)   Hemorrhagic stroke (HCC)   Subacute hemorrhagic infarct: MRI brain shows 2.5 x 1.1 x 1.8 cm well-circumscribed lenticular lesion involving the right lentiform nucleus/external capsule, favored to reflect an evolving subacute intraparenchymal hematoma, possibly related to a subacute hemorrhagic infarct. Mild surrounding vasogenic edema without significant regional mass effect. CTA head & neck show suspected right external capsule hematoma & no progressive mass effect, no large vessel occlusion or significant stenosis in the head or neck. Neuro & neuro surg recs apprec. Continue w/ neuro checks. Echo shows EF 83-41%, grade I diastolic function & no atrial level shunts. PT/OT recs SNF   C1 fracture: likely secondary to recent fall. Continue w/ C collar.  Will need outpatient f/u w/ neuro surg and repeat MRI as an outpatient. Unable to do carotid US at this time as C collar cannot be removed currently to manipulate neck for test.    S/p fall and back pain: head/neck CT and MRI see above. Scalp laceration no need suture. C1 fracture on c collar, f/u w/ Dr. Lacinda Axon. Chest CTA and A/P  negative. Lumbar spine MRI on 9/18 showed degenerative changes.    Diarrhea: h/o taking augmentin for diverticulitis. GI PCR panel and c. diff are both neg. Imodium prn   Hypokalemia: within normal limits    HTN: continue on cardura, losartan & HCTZ. Will hold losartan, HCTZ if Cr continues to rise. Recently not taking meds   DM2: well controlled w/ HbA1c 7.0. Continue on SSI w/ accuchecks   Normocytic anemia: H&H are labile. Will transfuse if Hb <7.0   CKDIIIb: Cr is  labile. Started on IVFs   Fibromyalgia and chronic pain: continue on home dose of gabapentin, effexor     DVT prophylaxis: SCDs secondary to above Code Status: full  Family Communication:  Disposition Plan: likely d/c to SNF   Level of care: Stepdown  Status is: Inpatient  Remains inpatient appropriate because:Unsafe d/c plan, IV treatments appropriate due to intensity of illness or inability to take PO, and Inpatient level of care appropriate due to severity of illness  Dispo: The patient is from: Home              Anticipated d/c is to: unclear              Patient currently is not medically stable to d/c.   Difficult to place patient : unclear      Consultants:  Neuro Neuro surg   Procedures:   Antimicrobials:   Subjective: Pt c/o neck and back pain   Objective: Vitals:   11/01/20 0500 11/01/20 0530 11/01/20 0630 11/01/20 0700  BP: (!) 95/50 (!) 92/46 (!) 98/52 (!) 96/53  Pulse: 69 68 71 72  Resp: 12 12 11 12   Temp:      TempSrc:      SpO2: 92% 93% 93% 93%  Weight:      Height:       No intake or output data in the 24 hours ending 11/01/20 0756  Filed Weights   10/29/20 1447  Weight: 79.4 kg    Examination:  General exam: Appears comfortable  Respiratory system: Clear breath sounds b/l  Cardiovascular system: S1 & S2+. No rubs or gallops  Gastrointestinal system: Abd is soft, NT, ND & normal bowel sounds  Central nervous system: alert and oriented. Moves all extremities  Psychiatry: Judgement and insight appear normal. Flat mood and affect    Data Reviewed: I have personally reviewed following labs and imaging studies  CBC: Recent Labs  Lab 10/26/20 1612 10/29/20 1947 10/30/20 0503 10/31/20 0703  WBC 7.8 8.4 6.3 5.3  NEUTROABS  --  6.5  --   --   HGB 11.9* 11.9* 10.6* 10.3*  HCT 34.5* 35.0* 31.1* 29.6*  MCV 94.8 94.3 92.8 95.2  PLT 244 234 200 710   Basic Metabolic Panel: Recent Labs  Lab 10/26/20 1612 10/26/20 1912  10/29/20 1947 10/30/20 0503 10/31/20 0703  NA 136  --  137 137 135  K 3.5  --  3.7 3.2* 3.5  CL 100  --  101 104 99  CO2 23  --  24 22 20*  GLUCOSE 243*  --  198* 170* 206*  BUN 14  --  16 14 19   CREATININE 1.15*  --  1.24* 1.06* 1.14*  CALCIUM 9.0  --  9.0 7.9* 8.5*  MG  --  1.8  --  1.9 1.9   GFR: Estimated Creatinine Clearance: 40.8 mL/min (A) (by C-G formula based on SCr of 1.14 mg/dL (H)). Liver Function Tests: Recent Labs  Lab 10/26/20 1621  AST 35  ALT 26  ALKPHOS 73  BILITOT 1.7*  PROT 7.3  ALBUMIN 3.3*   No results for input(s): LIPASE, AMYLASE in the last 168 hours.  No results for input(s): AMMONIA in the last 168 hours. Coagulation Profile: No results for input(s): INR, PROTIME in the last 168 hours. Cardiac Enzymes: No results for input(s): CKTOTAL, CKMB, CKMBINDEX, TROPONINI in the last 168 hours. BNP (last 3 results) No results for input(s): PROBNP in the last 8760 hours. HbA1C: Recent Labs    10/30/20 0503  HGBA1C 7.0*   CBG: Recent Labs  Lab 10/26/20 1812 10/31/20 1704  GLUCAP 236* 212*   Lipid Profile: Recent Labs    10/30/20 0503  CHOL 105  HDL 36*  LDLCALC 46  TRIG 116  CHOLHDL 2.9   Thyroid Function Tests: No results for input(s): TSH, T4TOTAL, FREET4, T3FREE, THYROIDAB in the last 72 hours. Anemia Panel: No results for input(s): VITAMINB12, FOLATE, FERRITIN, TIBC, IRON, RETICCTPCT in the last 72 hours. Sepsis Labs: No results for input(s): PROCALCITON, LATICACIDVEN in the last 168 hours.  Recent Results (from the past 240 hour(s))  C Difficile Quick Screen w PCR reflex     Status: None   Collection Time: 10/29/20 10:14 PM   Specimen: Stool  Result Value Ref Range Status   C Diff antigen NEGATIVE NEGATIVE Final   C Diff toxin NEGATIVE NEGATIVE Final   C Diff interpretation No C. difficile detected.  Final    Comment: Performed at [facility], [address] [place], [place] [address]  Resp Panel by RT-PCR (Flu  A&B, Covid) Nasopharyngeal Swab     Status: None   Collection Time: 10/29/20 11:31 PM   Specimen: Nasopharyngeal Swab; Nasopharyngeal(NP) swabs in vial transport medium  Result Value Ref Range Status   SARS Coronavirus 2 by RT PCR NEGATIVE NEGATIVE Final    Comment: (NOTE) SARS-CoV-2 target nucleic acids are NOT DETECTED.  The SARS-CoV-2 RNA is generally detectable in upper respiratory specimens during the acute phase of infection. The lowest  concentration of SARS-CoV-2 viral copies this assay can detect is 138 copies/mL. A negative result does not preclude SARS-Cov-2 infection and should not be used as the sole basis for treatment or other patient management decisions. A negative result may occur with  improper specimen collection/handling, submission of specimen other than nasopharyngeal swab, presence of viral mutation(s) within the areas targeted by this assay, and inadequate number of viral copies(<138 copies/mL). A negative result must be combined with clinical observations, patient history, and epidemiological information. The expected result is Negative.  Fact Sheet for Patients:  EntrepreneurPulse.com.au  Fact Sheet for Healthcare Providers:  IncredibleEmployment.be  This test is no t yet approved or cleared by the [place] FDA and  has been authorized for detection and/or diagnosis of SARS-CoV-2 by FDA under an Emergency Use Authorization (EUA). This EUA will remain  in effect (meaning this test can be used) for the duration of the COVID-19 declaration under Section 564(b)(1) of the Act, 21 U.S.C.section 360bbb-3(b)(1), unless the authorization is terminated  or revoked sooner.       Influenza A by PCR NEGATIVE NEGATIVE Final   Influenza B by PCR NEGATIVE NEGATIVE Final    Comment: (NOTE) The Xpert Xpress SARS-CoV-2/FLU/RSV plus assay is intended as an aid in the diagnosis of influenza from Nasopharyngeal swab specimens  and should not be used as a sole basis for treatment. Nasal washings and aspirates are unacceptable for Xpert Xpress SARS-CoV-2/FLU/RSV testing.  Fact Sheet for Patients: EntrepreneurPulse.com.au  Fact Sheet for Healthcare Providers: IncredibleEmployment.be  This test is not yet approved or cleared by the [place] FDA and has been authorized for detection and/or diagnosis of SARS-CoV-2 by FDA under an Emergency Use Authorization (EUA). This EUA will remain in effect (meaning this test can be used) for the duration of the COVID-19 declaration under Section 564(b)(1) of the Act, 21 U.S.C. section 360bbb-3(b)(1), unless the authorization is terminated or revoked.  Performed at [facility], [address] [place], [place] [address]   Gastrointestinal Panel by PCR , Stool     Status: None   Collection Time: 10/30/20 11:55 AM   Specimen: Stool  Result Value Ref Range Status   Campylobacter species NOT DETECTED NOT DETECTED Final   Plesimonas shigelloides NOT DETECTED NOT DETECTED Final   Salmonella species NOT DETECTED NOT DETECTED Final   Yersinia enterocolitica NOT DETECTED NOT DETECTED Final   Vibrio species NOT DETECTED NOT DETECTED Final   Vibrio cholerae NOT DETECTED NOT DETECTED Final   Enteroaggregative E coli (EAEC) NOT DETECTED NOT DETECTED Final   Enteropathogenic E coli (EPEC) NOT DETECTED NOT DETECTED Final   Enterotoxigenic E coli (ETEC) NOT DETECTED NOT DETECTED Final   Shiga like toxin producing E coli (STEC) NOT DETECTED NOT DETECTED Final   Shigella/Enteroinvasive E coli (EIEC) NOT DETECTED NOT DETECTED Final   Cryptosporidium NOT DETECTED NOT DETECTED Final   Cyclospora cayetanensis NOT DETECTED NOT DETECTED Final   Entamoeba histolytica NOT DETECTED NOT DETECTED Final   Giardia lamblia NOT DETECTED NOT DETECTED Final   Adenovirus F40/41 NOT DETECTED NOT DETECTED Final   Astrovirus NOT DETECTED NOT DETECTED  Final   Norovirus GI/GII NOT DETECTED NOT DETECTED Final   Rotavirus A NOT DETECTED NOT DETECTED Final   Sapovirus (I, II, IV, and V) NOT DETECTED NOT DETECTED Final    Comment: Performed at [facility], [address] [place], [place] [address]         Radiology Studies: CT ANGIO HEAD NECK W WO  CM  Result Date: 10/31/2020 CLINICAL DATA:  Stroke, follow up Follow up study. She is not currently a code stroke EXAM: CT ANGIOGRAPHY HEAD AND NECK TECHNIQUE: Multidetector CT imaging of the head and neck was performed using the standard protocol during bolus administration of intravenous contrast. Multiplanar CT image reconstructions and MIPs were obtained to evaluate the vascular anatomy. Carotid stenosis measurements (when applicable) are obtained utilizing NASCET criteria, using the distal internal carotid diameter as the denominator. CONTRAST:  26mL OMNIPAQUE IOHEXOL 350 MG/ML SOLN COMPARISON:  10/29/2020. FINDINGS: CT HEAD FINDINGS Brain: Similar appearance of the suspected evolving hemorrhage in the right external capsule, better characterized on recent MRI. No progressive mass effect. Similar mild associated hyperdensity, likely representing evolving blood products. No evidence of interval acute large vascular territory infarct or new acute hemorrhage. No hydrocephalus or extra-axial fluid collection. Vascular: See below. Skull: No acute fracture. Sinuses: Clear visualized sinuses. Orbits: Postoperative changes of the right globe. No acute orbital findings. Review of the MIP images confirms the above findings CTA NECK FINDINGS Aortic arch: Atherosclerosis.  Great vessel origins are patent. Right carotid system: Atherosclerosis at the carotid bifurcation without greater than 50% stenosis. Left carotid system: Mild atherosclerosis at the carotid bifurcation without greater than 50% stenosis. Vertebral arteries: Right dominant. No significant (greater than 50%) stenosis. Skeleton: Severe  degenerative disease in the lower cervical spine. Severe multilevel facet arthropathy with degenerative anterolisthesis of C4 on C5. Other neck: No acute abnormality. Small (5 mm) nodule within the right thyroid. No follow-up imaging is recommended. Reference: J Am Coll Radiol. 2015 Feb;12(2): 143-50. Upper chest: Motion limited evaluation without definite consolidation in the visualized lung apices. Review of the MIP images confirms the above findings CTA HEAD FINDINGS Anterior circulation: Bilateral intracranial ICA calcific atherosclerosis with mild narrowing. Bilateral MCAs and ACAs are patent without proximal hemodynamically significant stenosis. No evidence of a vascular malformation in the region of the suspected evolving right external capsule hematoma, although blood products limits evaluation. No aneurysm identified. Posterior circulation: Right dominant intradural vertebral arteries. Mild atherosclerotic narrowing of the proximal right intradural vertebral artery. Basilar artery and bilateral posterior cerebral arteries are patent without significant proximal stenosis. No aneurysm identified. Venous sinuses: As permitted by contrast timing, patent. Review of the MIP images confirms the above findings IMPRESSION: 1. Similar appearance of the suspected evolving right external capsule hematoma, better characterized on recent MRI. No progressive mass effect or new/interval acute abnormality. 2. No large vessel occlusion or proximal hemodynamically significant stenosis in the head or neck. 3. No evidence of a vascular malformation in the region of the intraparenchymal hematoma, although blood products limits evaluation. Electronically Signed   By: Margaretha Sheffield M.D.   On: 10/31/2020 09:29   ECHOCARDIOGRAM COMPLETE  Result Date: 10/31/2020    ECHOCARDIOGRAM REPORT   Patient Name:   [name] Date of Exam: 10/30/2020 Medical Rec #:  [id number]       Height: Accession #:    [id number]      Weight:  Date of Birth:  1940-08-15       BSA: Patient Age:    68 years        BP:           98/60 mmHg Patient Gender: F               HR:           69 bpm. Exam Location:  [facility] Procedure: 2D Echo, Cardiac Doppler and Color Doppler Indications:  Stroke I63.9  History:         Patient has no prior history of Echocardiogram examinations.                  Risk Factors:Hypertension and Diabetes.  Sonographer:     Sherrie Sport Referring Phys:  [id number] Anda Latina Diagnosing Phys: Ida Rogue MD IMPRESSIONS  1. Left ventricular ejection fraction, by estimation, is 55 to 60%. The left ventricle has normal function. The left ventricle has no regional wall motion abnormalities. There is mild left ventricular hypertrophy. Left ventricular diastolic parameters are consistent with Grade I diastolic dysfunction (impaired relaxation).  2. Right ventricular systolic function is normal. The right ventricular size is normal. There is normal pulmonary artery systolic pressure. The estimated right ventricular systolic pressure is 35.3 mmHg.  3. The mitral valve is normal in structure. No evidence of mitral valve regurgitation. No evidence of mitral stenosis. FINDINGS  Left Ventricle: Left ventricular ejection fraction, by estimation, is 55 to 60%. The left ventricle has normal function. The left ventricle has no regional wall motion abnormalities. The left ventricular internal cavity size was normal in size. There is  mild left ventricular hypertrophy. Left ventricular diastolic parameters are consistent with Grade I diastolic dysfunction (impaired relaxation). Right Ventricle: The right ventricular size is normal. No increase in right ventricular wall thickness. Right ventricular systolic function is normal. There is normal pulmonary artery systolic pressure. The tricuspid regurgitant velocity is 1.89 m/s, and  with an assumed right atrial pressure of 5 mmHg, the estimated right ventricular systolic pressure is 29.9 mmHg. Left Atrium:  Left atrial size was normal in size. Right Atrium: Right atrial size was normal in size. Pericardium: There is no evidence of pericardial effusion. Mitral Valve: The mitral valve is normal in structure. No evidence of mitral valve regurgitation. No evidence of mitral valve stenosis. Tricuspid Valve: The tricuspid valve is normal in structure. Tricuspid valve regurgitation is not demonstrated. No evidence of tricuspid stenosis. Aortic Valve: The aortic valve was not well visualized. Aortic valve regurgitation is not visualized. No aortic stenosis is present. Aortic valve mean gradient measures 3.0 mmHg. Aortic valve peak gradient measures 5.9 mmHg. Aortic valve area, by VTI measures 2.61 cm. Pulmonic Valve: The pulmonic valve was normal in structure. Pulmonic valve regurgitation is not visualized. No evidence of pulmonic stenosis. Aorta: The aortic root is normal in size and structure. Venous: The inferior vena cava is normal in size with greater than 50% respiratory variability, suggesting right atrial pressure of 3 mmHg. IAS/Shunts: No atrial level shunt detected by color flow Doppler.  LEFT VENTRICLE PLAX 2D LVIDd:         3.90 cm  Diastology LVIDs:         2.74 cm  LV e' medial:    3.81 cm/s LV PW:         1.10 cm  LV E/e' medial:  11.9 LV IVS:        1.11 cm  LV e' lateral:   5.55 cm/s LVOT diam:     2.00 cm  LV E/e' lateral: 8.2 LV SV:         57 LVOT Area:     3.14 cm  RIGHT VENTRICLE RV Basal diam:  2.40 cm RV S prime:     13.60 cm/s TAPSE (M-mode): 3.6 cm LEFT ATRIUM             RIGHT ATRIUM LA diam:        3.40 cm RA  Area:     9.99 cm LA Vol (A2C):   63.1 ml RA Volume:   17.40 ml LA Vol (A4C):   44.7 ml LA Biplane Vol: 54.8 ml  AORTIC VALVE                   PULMONIC VALVE AV Area (Vmax):    2.20 cm    PV Vmax:        0.80 m/s AV Area (Vmean):   2.35 cm    PV Peak grad:   2.6 mmHg AV Area (VTI):     2.61 cm    RVOT Peak grad: 2 mmHg AV Vmax:           121.00 cm/s AV Vmean:          79.000 cm/s AV VTI:             0.218 m AV Peak Grad:      5.9 mmHg AV Mean Grad:      3.0 mmHg LVOT Vmax:         84.90 cm/s LVOT Vmean:        59.000 cm/s LVOT VTI:          0.181 m LVOT/AV VTI ratio: 0.83  AORTA Ao Root diam: 3.27 cm MITRAL VALVE               TRICUSPID VALVE MV Area (PHT): 4.41 cm    TR Peak grad:   14.3 mmHg MV Decel Time: 172 msec    TR Vmax:        189.00 cm/s MV E velocity: 45.40 cm/s                            SHUNTS                            Systemic VTI:  0.18 m                            Systemic Diam: 2.00 cm Ida Rogue MD Electronically signed by Ida Rogue MD Signature Date/Time: 10/31/2020/3:26:30 PM    Final         Scheduled Meds:   stroke: mapping our early stages of recovery book   Does not apply Once   doxazosin  2 mg Oral QHS   gabapentin  100 mg Oral BID   losartan  100 mg Oral Daily   And   hydrochlorothiazide  25 mg Oral Daily   insulin aspart  0-9 Units Subcutaneous TID WC   potassium chloride  40 mEq Oral Once   venlafaxine  37.5 mg Oral BID   Continuous Infusions:  ondansetron (ZOFRAN) IV Stopped (10/30/20 1159)     LOS: 3 days    Time spent: 31 mins     Wyvonnia Dusky, MD [facility] Pager [phone]  If 7PM-7AM, please contact night-coverage 11/01/2020, 7:56 AM

## 2020-11-01 NOTE — ED Notes (Addendum)
Pt reports to this RN she doesn't feel as if she has voided in a while, the urine in the suction container is amber in color, MD aware. IVF recommenced.

## 2020-11-01 NOTE — ED Notes (Signed)
Night hospital coverage messaged about pt's blood pressure at this time.

## 2020-11-01 NOTE — Progress Notes (Addendum)
Cross Cover Patient with soft pressures. AKI on labs this AM up to 1.4 from 1.1, hold HCTZ and losartan - gentle hydration overnight  Update 0425 - Decreasing urine output reported - systolic pressures trending down. 500 ml bolus NS ordered

## 2020-11-01 NOTE — Progress Notes (Signed)
TTE does not show findings predisposing to cardioembolic stroke. No mural thrombus or valvular vegetation mentioned in the report.      CTA of head and neck showed no large vessel occlusion or proximal hemodynamically significant stenosis in the head or neck. No evidence of a vascular malformation in the region of the intraparenchymal hematoma, although blood products limits evaluation.  A/R:80 year old female presenting after a fall striking the back of her head. Imaging reveals a right basal ganglia hemorrhage.  - Work up for possible ischemic stroke suggests low likelihood of stroke with hemorrhagic conversion as the etiology of the patient's hemorrhagic right basal ganglia lesion.  - Question whether an unusual presentation of shear injury from her head impact could account for the hemorrhagic lesion.  - A pre-existing underlying lesion is also possible. Agree with Neurosurgery's plan to arrange delayed MRI brain to re-evaluate after the blood products have cleared. - Hypertensive hemorrhage is now higher on the DDx, despite the very demarcated appearance of the lesion, which more resembles an ischemic stroke which was followed by petechial hemorrhagic transformation.  - Management of her C1 fracture per Neurosurgery.  - Neurology will sign off. Please call if there are additional questions.   Electronically signed: Dr. Kerney Elbe

## 2020-11-01 NOTE — ED Notes (Signed)
Pt given breakfast tray and repositioned in bed. Hospital bed also ordered or pt since pt has been waiting with a c-spine FX for over 66 hours.

## 2020-11-01 NOTE — ED Notes (Signed)
Report attempted to ICU x 1. Left number for nurse to call back.

## 2020-11-01 NOTE — ED Notes (Signed)
Provider messaged regarding pt's blood pressure at this time.

## 2020-11-01 NOTE — Plan of Care (Signed)

## 2020-11-02 DIAGNOSIS — I10 Essential (primary) hypertension: Secondary | ICD-10-CM | POA: Diagnosis not present

## 2020-11-02 DIAGNOSIS — S12001A Unspecified nondisplaced fracture of first cervical vertebra, initial encounter for closed fracture: Secondary | ICD-10-CM | POA: Diagnosis not present

## 2020-11-02 DIAGNOSIS — I619 Nontraumatic intracerebral hemorrhage, unspecified: Secondary | ICD-10-CM | POA: Diagnosis not present

## 2020-11-02 LAB — BASIC METABOLIC PANEL
Anion gap: 8 (ref 5–15)
BUN: 23 mg/dL (ref 8–23)
CO2: 26 mmol/L (ref 22–32)
Calcium: 8.1 mg/dL — ABNORMAL LOW (ref 8.9–10.3)
Chloride: 103 mmol/L (ref 98–111)
Creatinine, Ser: 1.35 mg/dL — ABNORMAL HIGH (ref 0.44–1.00)
GFR, Estimated: 40 mL/min — ABNORMAL LOW (ref >60)
Glucose, Bld: 143 mg/dL — ABNORMAL HIGH (ref 70–99)
Potassium: 3.9 mmol/L (ref 3.5–5.1)
Sodium: 137 mmol/L (ref 135–145)

## 2020-11-02 LAB — GLUCOSE, CAPILLARY
Glucose-Capillary: 120 mg/dL — ABNORMAL HIGH (ref 70–99)
Glucose-Capillary: 135 mg/dL — ABNORMAL HIGH (ref 70–99)
Glucose-Capillary: 158 mg/dL — ABNORMAL HIGH (ref 70–99)
Glucose-Capillary: 137 mg/dL — ABNORMAL HIGH (ref 70–99)

## 2020-11-02 LAB — CBC
HCT: 28.1 % — ABNORMAL LOW (ref 36.0–46.0)
Hemoglobin: 9.5 g/dL — ABNORMAL LOW (ref 12.0–15.0)
MCH: 32.5 pg (ref 26.0–34.0)
MCHC: 33.8 g/dL (ref 30.0–36.0)
MCV: 96.2 fL (ref 80.0–100.0)
Platelets: 162 10*3/uL (ref 150–400)
RBC: 2.92 MIL/uL — ABNORMAL LOW (ref 3.87–5.11)
RDW: 14 % (ref 11.5–15.5)
WBC: 5.2 10*3/uL (ref 4.0–10.5)
nRBC: 0 % (ref 0.0–0.2)

## 2020-11-02 LAB — MAGNESIUM: Magnesium: 1.7 mg/dL (ref 1.7–2.4)

## 2020-11-02 MED ORDER — SODIUM CHLORIDE 0.9 % IV BOLUS
500.0000 mL | Freq: Once | INTRAVENOUS | Status: AC
  Administered 2020-11-02: 500 mL via INTRAVENOUS

## 2020-11-02 MED ORDER — ADULT MULTIVITAMIN W/MINERALS CH
1.0000 | ORAL_TABLET | Freq: Every day | ORAL | Status: DC
  Administered 2020-11-02 – 2020-11-07 (×6): 1 via ORAL
  Filled 2020-11-02 (×6): qty 1

## 2020-11-02 MED ORDER — ENSURE ENLIVE PO LIQD
237.0000 mL | Freq: Three times a day (TID) | ORAL | Status: DC
  Administered 2020-11-02 – 2020-11-07 (×9): 237 mL via ORAL

## 2020-11-02 NOTE — Progress Notes (Signed)
Initial Nutrition Assessment  DOCUMENTATION CODES:   Not applicable  INTERVENTION:   -MVI with minerals daily -Ensure Enlive po BID, each supplement provides 350 kcal and 20 grams of protein  -Liberalize diet to regular for widest variety of food selections  NUTRITION DIAGNOSIS:   Predicted suboptimal nutrient intake related to diarrhea, poor appetite as evidenced by per patient/family report.  GOAL:   Patient will meet greater than or equal to 90% of their needs  MONITOR:   PO intake, Supplement acceptance, Labs, Weight trends, Skin, I & O's  REASON FOR ASSESSMENT:   Malnutrition Screening Tool    ASSESSMENT:   [name] is a 80 y.o. female with medical history significant of HTN, HLD, T2DM, fibromyalgia, arthritis, iron deficiency anemia, CKD3b, GERD, COVID-19 infection and diverticulitis in Aug this year, presented to ED with back and neck pain after fall.  Pt admitted with possible subacute hemorrhagic infarct.   9/23- s/p TEE- no findings predisposing to cardioembolic stroke. No mural thrombus or valvular vegetation mentioned in the report; s/p CTA of head- revealed no large vessel occlusion or proximal hemodynamically significant stenosis in the head or neck. No evidence of a vascular malformation in the region of the intraparenchymal hematoma, although blood products limits evaluation.  Reviewed I/O's: +2.5 L x 24 hours and +2.3 L since admission  UOP: 255 ml x 24 hours  Pt unavailable at time of visit. Attempted to speak with pt via call to hospital room phone, however, unable to reach. RD unable to obtain further nutrition-related history or complete nutrition-focused physical exam at this time.   Per H&P, pt reports a general decline in health secondary to COVID-19, which included diarrhea, poor oral intake, and poor medication compliance.   Per neurosurgery pt with C1 fracture. Plan to continue c-collar with no surgical interventions planned at this time.  Plan for delayed MRI of brain once pt is more medically stable.  No meal completion data available to assess at this time.   Reviewed wt hx; pt has experienced a 12% wt loss over the past 2 months, which is significant for time frame.   Pt is at high risk for malnutrition, however, unable to identify at this time. Pt would greatly benefit from addition of oral nutrition supplements.   Pt with poor oral intake and would benefit from nutrient dense supplement. One Ensure Enlive supplement provides 350 kcals, 20 grams protein, and 44-45 grams of carbohydrate vs one Glucerna shake supplement, which provides 220 kcals, 10 grams of protein, and 26 grams of carbohydrate. Given pt's hx of DM, RD will reassess adequacy of PO intake, CBGS, and adjust supplement regimen as appropriate at follow-up.    Medications reviewed.   Lab Results  Component Value Date   HGBA1C 7.0 (H) 10/30/2020   PTA DM medications are 10 mg glipizide daily and 1500 mg metformin daily. Per med rec report, pt has not been taking medications PTA.  Labs reviewed: CBGS: 120-192 (inpatient orders for glycemic control are 0-9 units insulin aspart TID with meals).     Diet Order:   Diet Order             Diet Carb Modified Fluid consistency: Thin; Room service appropriate? Yes  Diet effective now                   EDUCATION NEEDS:   No education needs have been identified at this time  Skin:  Skin Assessment: Reviewed RN Assessment  Last BM:  10/31/20  Height:   Ht Readings from Last 1 Encounters:  11/01/20 5\' 4"  (1.626 m)    Weight:   Wt Readings from Last 1 Encounters:  11/01/20 77.9 kg    Ideal Body Weight:  54.5 kg  BMI:  Body mass index is 29.48 kg/m.  Estimated Nutritional Needs:   Kcal:  1950-2150  Protein:  100-115 grams  Fluid:  > 1.9 L    Loistine Chance, RD, LDN, IXL Registered Dietitian II Certified Diabetes Care and Education Specialist Please refer to [facility] for RD and/or RD  on-call/weekend/after hours pager

## 2020-11-02 NOTE — Progress Notes (Signed)
PROGRESS NOTE    [name]  JYN:[id number] DOB: 1940-03-10 DOA: 10/29/2020 PCP: Venia Carbon, MD   Assessment & Plan:   Active Problems:   Essential hypertension, benign   GERD   Fibromyalgia   Osteoarthritis, knee   Type 2 diabetes, uncontrolled, with neuropathy (HCC)   Iron deficiency anemia   Stage 3b chronic kidney disease (HCC)   Hemorrhagic stroke (HCC)   Cerebral brain hemorrhage (HCC)   Subacute hemorrhagic infarct: MRI brain shows 2.5 x 1.1 x 1.8 cm well-circumscribed lenticular lesion involving the right lentiform nucleus/external capsule, favored to reflect an evolving subacute intraparenchymal hematoma, possibly related to a subacute hemorrhagic infarct. Mild surrounding vasogenic edema without significant regional mass effect. CTA head & neck show suspected right external capsule hematoma & no progressive mass effect, no large vessel occlusion or significant stenosis in the head or neck. Neuro & neuro surg recs apprec. Continue w/ neuro checks. Echo shows EF 86-57%, grade I diastolic function & no atrial level shunts. PT/OT recs SNF   C1 fracture: likely secondary to recent fall. Continue w/ C collar.  Will need outpatient f/u w/ neuro surg and repeat MRI as an outpatient. Unable to do carotid US at this time as C collar cannot be removed currently to manipulate neck for test.    S/p fall and back pain: head/neck CT and MRI see above. Scalp laceration no need suture. C1 fracture on c collar, f/u w/ Dr. Lacinda Axon. Chest CTA and A/P  negative. Lumbar spine MRI on 9/18 showed degenerative changes.    Diarrhea: h/o taking augmentin for diverticulitis. GI PCR panel and c. diff are both neg. Imodium prn. GI consulted but no acute/emergent procedures will be done inpatient and pt will need to f/u outpatient   Hypokalemia: WNL today    HTN: continue on cardura, losartan & HCTZ. Will hold losartan, HCTZ if Cr continues to rise. Recently not taking meds   DM2: HbA1c 7.0,  well controlled. Continue on SSI w/ accuchecks   Normocytic anemia: H&H are trending down. No need for a transfusion currently   CKDIIIb: Cr is labile. Continue on IVFs   Fibromyalgia and chronic pain: continue on home dose of effexor & gabapentin    DVT prophylaxis: SCDs secondary to above Code Status: full  Family Communication:  Disposition Plan: likely d/c to SNF   Level of care: Progressive Cardiac  Status is: Inpatient  Remains inpatient appropriate because:Unsafe d/c plan, IV treatments appropriate due to intensity of illness or inability to take PO, and Inpatient level of care appropriate due to severity of illness, waiting on SNF placement   Dispo: The patient is from: Home              Anticipated d/c is to: unclear              Patient currently is medically stable for d/c    Difficult to place patient : unclear      Consultants:  Neuro Neuro surg   Procedures:   Antimicrobials:   Subjective: Pt c/o fatigue   Objective: Vitals:   11/02/20 0300 11/02/20 0400 11/02/20 0500 11/02/20 0600  BP: 108/70 100/61 134/69 111/61  Pulse: 70 80 74 64  Resp: 12 14 12 13   Temp:  98.5 F (36.9 C)    TempSrc:  Oral    SpO2: 94% 94% 94% 94%  Weight:      Height:        Intake/Output Summary (Last 24 hours) at 11/02/2020  0825 Last data filed at 11/02/2020 0600 Gross per 24 hour  Intake 2803.73 ml  Output 255 ml  Net 2548.73 ml    Filed Weights   10/29/20 1447 11/01/20 2230  Weight: 79.4 kg 77.9 kg    Examination:  General exam: Appears calm & comfortable  Respiratory system: Clear breath sounds b/l  Cardiovascular system: S1/S2+. No rubs or clicks  Gastrointestinal system: Abd is soft, NT, ND & hypoactive bowel sounds  Central nervous system: alert and oriented. Moves all extremities  Psychiatry: Judgement and insight appear normal. Flat mood and affect    Data Reviewed: I have personally reviewed following labs and imaging studies  CBC: Recent  Labs  Lab 10/29/20 1947 10/30/20 0503 10/31/20 0703 11/01/20 0753 11/02/20 0401  WBC 8.4 6.3 5.3 4.7 5.2  NEUTROABS 6.5  --   --   --   --   HGB 11.9* 10.6* 10.3* 10.2* 9.5*  HCT 35.0* 31.1* 29.6* 30.0* 28.1*  MCV 94.3 92.8 95.2 94.6 96.2  PLT 234 200 188 173 280   Basic Metabolic Panel: Recent Labs  Lab 10/26/20 1912 10/29/20 1947 10/30/20 0503 10/31/20 0703 11/01/20 0753 11/02/20 0401  NA  --  137 137 135 135 137  K  --  3.7 3.2* 3.5 3.5 3.9  CL  --  101 104 99 100 103  CO2  --  24 22 20* 23 26  GLUCOSE  --  198* 170* 206* 168* 143*  BUN  --  16 14 19 22 23   CREATININE  --  1.24* 1.06* 1.14* 1.40* 1.35*  CALCIUM  --  9.0 7.9* 8.5* 8.5* 8.1*  MG 1.8  --  1.9 1.9 1.8 1.7   GFR: Estimated Creatinine Clearance: 34.1 mL/min (A) (by C-G formula based on SCr of 1.35 mg/dL (H)). Liver Function Tests: Recent Labs  Lab 10/26/20 1621  AST 35  ALT 26  ALKPHOS 73  BILITOT 1.7*  PROT 7.3  ALBUMIN 3.3*   No results for input(s): LIPASE, AMYLASE in the last 168 hours.  No results for input(s): AMMONIA in the last 168 hours. Coagulation Profile: No results for input(s): INR, PROTIME in the last 168 hours. Cardiac Enzymes: No results for input(s): CKTOTAL, CKMB, CKMBINDEX, TROPONINI in the last 168 hours. BNP (last 3 results) No results for input(s): PROBNP in the last 8760 hours. HbA1C: No results for input(s): HGBA1C in the last 72 hours.  CBG: Recent Labs  Lab 11/01/20 1316 11/01/20 1632 11/01/20 2151 11/01/20 2218 11/02/20 0813  GLUCAP 169* 156* 192* 162* 120*   Lipid Profile: No results for input(s): CHOL, HDL, LDLCALC, TRIG, CHOLHDL, LDLDIRECT in the last 72 hours.  Thyroid Function Tests: No results for input(s): TSH, T4TOTAL, FREET4, T3FREE, THYROIDAB in the last 72 hours. Anemia Panel: No results for input(s): VITAMINB12, FOLATE, FERRITIN, TIBC, IRON, RETICCTPCT in the last 72 hours. Sepsis Labs: No results for input(s): PROCALCITON, LATICACIDVEN  in the last 168 hours.  Recent Results (from the past 240 hour(s))  C Difficile Quick Screen w PCR reflex     Status: None   Collection Time: 10/29/20 10:14 PM   Specimen: Stool  Result Value Ref Range Status   C Diff antigen NEGATIVE NEGATIVE Final   C Diff toxin NEGATIVE NEGATIVE Final   C Diff interpretation No C. difficile detected.  Final    Comment: Performed at [facility], [address] [place], [place] [address]  Resp Panel by RT-PCR (Flu A&B, Covid) Nasopharyngeal Swab  Status: None   Collection Time: 10/29/20 11:31 PM   Specimen: Nasopharyngeal Swab; Nasopharyngeal(NP) swabs in vial transport medium  Result Value Ref Range Status   SARS Coronavirus 2 by RT PCR NEGATIVE NEGATIVE Final    Comment: (NOTE) SARS-CoV-2 target nucleic acids are NOT DETECTED.  The SARS-CoV-2 RNA is generally detectable in upper respiratory specimens during the acute phase of infection. The lowest concentration of SARS-CoV-2 viral copies this assay can detect is 138 copies/mL. A negative result does not preclude SARS-Cov-2 infection and should not be used as the sole basis for treatment or other patient management decisions. A negative result may occur with  improper specimen collection/handling, submission of specimen other than nasopharyngeal swab, presence of viral mutation(s) within the areas targeted by this assay, and inadequate number of viral copies(<138 copies/mL). A negative result must be combined with clinical observations, patient history, and epidemiological information. The expected result is Negative.  Fact Sheet for Patients:  EntrepreneurPulse.com.au  Fact Sheet for Healthcare Providers:  IncredibleEmployment.be  This test is no t yet approved or cleared by the [place] FDA and  has been authorized for detection and/or diagnosis of SARS-CoV-2 by FDA under an Emergency Use Authorization (EUA). This EUA will remain  in  effect (meaning this test can be used) for the duration of the COVID-19 declaration under Section 564(b)(1) of the Act, 21 U.S.C.section 360bbb-3(b)(1), unless the authorization is terminated  or revoked sooner.       Influenza A by PCR NEGATIVE NEGATIVE Final   Influenza B by PCR NEGATIVE NEGATIVE Final    Comment: (NOTE) The Xpert Xpress SARS-CoV-2/FLU/RSV plus assay is intended as an aid in the diagnosis of influenza from Nasopharyngeal swab specimens and should not be used as a sole basis for treatment. Nasal washings and aspirates are unacceptable for Xpert Xpress SARS-CoV-2/FLU/RSV testing.  Fact Sheet for Patients: EntrepreneurPulse.com.au  Fact Sheet for Healthcare Providers: IncredibleEmployment.be  This test is not yet approved or cleared by the [place] FDA and has been authorized for detection and/or diagnosis of SARS-CoV-2 by FDA under an Emergency Use Authorization (EUA). This EUA will remain in effect (meaning this test can be used) for the duration of the COVID-19 declaration under Section 564(b)(1) of the Act, 21 U.S.C. section 360bbb-3(b)(1), unless the authorization is terminated or revoked.  Performed at [facility], [address] [place],  [address]   Gastrointestinal Panel by PCR , Stool     Status: None   Collection Time: 10/30/20 11:55 AM   Specimen: Stool  Result Value Ref Range Status   Campylobacter species NOT DETECTED NOT DETECTED Final   Plesimonas shigelloides NOT DETECTED NOT DETECTED Final   Salmonella species NOT DETECTED NOT DETECTED Final   Yersinia enterocolitica NOT DETECTED NOT DETECTED Final   Vibrio species NOT DETECTED NOT DETECTED Final   Vibrio cholerae NOT DETECTED NOT DETECTED Final   Enteroaggregative E coli (EAEC) NOT DETECTED NOT DETECTED Final   Enteropathogenic E coli (EPEC) NOT DETECTED NOT DETECTED Final   Enterotoxigenic E coli (ETEC) NOT DETECTED NOT DETECTED  Final   Shiga like toxin producing E coli (STEC) NOT DETECTED NOT DETECTED Final   Shigella/Enteroinvasive E coli (EIEC) NOT DETECTED NOT DETECTED Final   Cryptosporidium NOT DETECTED NOT DETECTED Final   Cyclospora cayetanensis NOT DETECTED NOT DETECTED Final   Entamoeba histolytica NOT DETECTED NOT DETECTED Final   Giardia lamblia NOT DETECTED NOT DETECTED Final   Adenovirus F40/41 NOT DETECTED NOT DETECTED Final   Astrovirus  NOT DETECTED NOT DETECTED Final   Norovirus GI/GII NOT DETECTED NOT DETECTED Final   Rotavirus A NOT DETECTED NOT DETECTED Final   Sapovirus (I, II, IV, and V) NOT DETECTED NOT DETECTED Final    Comment: Performed at [facility], [address] [place], [place] [address]  MRSA Next Gen by PCR, Nasal     Status: None   Collection Time: 11/01/20  7:01 PM   Specimen: Nasal Mucosa; Nasal Swab  Result Value Ref Range Status   MRSA by PCR Next Gen NOT DETECTED NOT DETECTED Final    Comment: (NOTE) The GeneXpert MRSA Assay (FDA approved for NASAL specimens only), is one component of a comprehensive MRSA colonization surveillance program. It is not intended to diagnose MRSA infection nor to guide or monitor treatment for MRSA infections. Test performance is not FDA approved in patients less than 47 years old. Performed at [facility], [address] [place], [place] [address]          Radiology Studies: CT ANGIO HEAD NECK W WO CM  Result Date: 10/31/2020 CLINICAL DATA:  Stroke, follow up Follow up study. She is not currently a code stroke EXAM: CT ANGIOGRAPHY HEAD AND NECK TECHNIQUE: Multidetector CT imaging of the head and neck was performed using the standard protocol during bolus administration of intravenous contrast. Multiplanar CT image reconstructions and MIPs were obtained to evaluate the vascular anatomy. Carotid stenosis measurements (when applicable) are obtained utilizing NASCET criteria, using the distal internal carotid  diameter as the denominator. CONTRAST:  86mL OMNIPAQUE IOHEXOL 350 MG/ML SOLN COMPARISON:  10/29/2020. FINDINGS: CT HEAD FINDINGS Brain: Similar appearance of the suspected evolving hemorrhage in the right external capsule, better characterized on recent MRI. No progressive mass effect. Similar mild associated hyperdensity, likely representing evolving blood products. No evidence of interval acute large vascular territory infarct or new acute hemorrhage. No hydrocephalus or extra-axial fluid collection. Vascular: See below. Skull: No acute fracture. Sinuses: Clear visualized sinuses. Orbits: Postoperative changes of the right globe. No acute orbital findings. Review of the MIP images confirms the above findings CTA NECK FINDINGS Aortic arch: Atherosclerosis.  Great vessel origins are patent. Right carotid system: Atherosclerosis at the carotid bifurcation without greater than 50% stenosis. Left carotid system: Mild atherosclerosis at the carotid bifurcation without greater than 50% stenosis. Vertebral arteries: Right dominant. No significant (greater than 50%) stenosis. Skeleton: Severe degenerative disease in the lower cervical spine. Severe multilevel facet arthropathy with degenerative anterolisthesis of C4 on C5. Other neck: No acute abnormality. Small (5 mm) nodule within the right thyroid. No follow-up imaging is recommended. Reference: J Am Coll Radiol. 2015 Feb;12(2): 143-50. Upper chest: Motion limited evaluation without definite consolidation in the visualized lung apices. Review of the MIP images confirms the above findings CTA HEAD FINDINGS Anterior circulation: Bilateral intracranial ICA calcific atherosclerosis with mild narrowing. Bilateral MCAs and ACAs are patent without proximal hemodynamically significant stenosis. No evidence of a vascular malformation in the region of the suspected evolving right external capsule hematoma, although blood products limits evaluation. No aneurysm identified.  Posterior circulation: Right dominant intradural vertebral arteries. Mild atherosclerotic narrowing of the proximal right intradural vertebral artery. Basilar artery and bilateral posterior cerebral arteries are patent without significant proximal stenosis. No aneurysm identified. Venous sinuses: As permitted by contrast timing, patent. Review of the MIP images confirms the above findings IMPRESSION: 1. Similar appearance of the suspected evolving right external capsule hematoma, better characterized on recent MRI. No progressive mass effect or new/interval acute  abnormality. 2. No large vessel occlusion or proximal hemodynamically significant stenosis in the head or neck. 3. No evidence of a vascular malformation in the region of the intraparenchymal hematoma, although blood products limits evaluation. Electronically Signed   By: Margaretha Sheffield M.D.   On: 10/31/2020 09:29        Scheduled Meds:   stroke: mapping our early stages of recovery book   Does not apply Once   Chlorhexidine Gluconate Cloth  6 each Topical Daily   doxazosin  2 mg Oral QHS   gabapentin  100 mg Oral BID   losartan  100 mg Oral Daily   And   hydrochlorothiazide  25 mg Oral Daily   insulin aspart  0-9 Units Subcutaneous TID WC   potassium chloride  40 mEq Oral Once   venlafaxine  37.5 mg Oral BID   Continuous Infusions:  sodium chloride 75 mL/hr at 11/01/20 2300   ondansetron (ZOFRAN) IV Stopped (10/30/20 1159)     LOS: 4 days    Time spent: 25 mins     Wyvonnia Dusky, MD [facility] Pager [phone]  If 7PM-7AM, please contact night-coverage 11/02/2020, 8:25 AM

## 2020-11-02 NOTE — Progress Notes (Signed)
Patient only had 65ml UOP over last 2 hours. Shift avg since admission WDL. Sharion Settler, NP made aware. Will continue to monitor.

## 2020-11-03 DIAGNOSIS — I10 Essential (primary) hypertension: Secondary | ICD-10-CM | POA: Diagnosis not present

## 2020-11-03 DIAGNOSIS — S12001A Unspecified nondisplaced fracture of first cervical vertebra, initial encounter for closed fracture: Secondary | ICD-10-CM | POA: Diagnosis not present

## 2020-11-03 DIAGNOSIS — I619 Nontraumatic intracerebral hemorrhage, unspecified: Secondary | ICD-10-CM | POA: Diagnosis not present

## 2020-11-03 LAB — BASIC METABOLIC PANEL
Anion gap: 7 (ref 5–15)
BUN: 16 mg/dL (ref 8–23)
CO2: 25 mmol/L (ref 22–32)
Calcium: 8 mg/dL — ABNORMAL LOW (ref 8.9–10.3)
Chloride: 103 mmol/L (ref 98–111)
Creatinine, Ser: 1.02 mg/dL — ABNORMAL HIGH (ref 0.44–1.00)
GFR, Estimated: 56 mL/min — ABNORMAL LOW (ref >60)
Glucose, Bld: 121 mg/dL — ABNORMAL HIGH (ref 70–99)
Potassium: 3.4 mmol/L — ABNORMAL LOW (ref 3.5–5.1)
Sodium: 135 mmol/L (ref 135–145)

## 2020-11-03 LAB — MAGNESIUM: Magnesium: 1.5 mg/dL — ABNORMAL LOW (ref 1.7–2.4)

## 2020-11-03 LAB — CBC
HCT: 28.2 % — ABNORMAL LOW (ref 36.0–46.0)
Hemoglobin: 9.5 g/dL — ABNORMAL LOW (ref 12.0–15.0)
MCH: 31.6 pg (ref 26.0–34.0)
MCHC: 33.7 g/dL (ref 30.0–36.0)
MCV: 93.7 fL (ref 80.0–100.0)
Platelets: 142 10*3/uL — ABNORMAL LOW (ref 150–400)
RBC: 3.01 MIL/uL — ABNORMAL LOW (ref 3.87–5.11)
RDW: 13.9 % (ref 11.5–15.5)
WBC: 4 10*3/uL (ref 4.0–10.5)
nRBC: 0 % (ref 0.0–0.2)

## 2020-11-03 LAB — GLUCOSE, CAPILLARY
Glucose-Capillary: 126 mg/dL — ABNORMAL HIGH (ref 70–99)
Glucose-Capillary: 182 mg/dL — ABNORMAL HIGH (ref 70–99)
Glucose-Capillary: 231 mg/dL — ABNORMAL HIGH (ref 70–99)
Glucose-Capillary: 182 mg/dL — ABNORMAL HIGH (ref 70–99)

## 2020-11-03 MED ORDER — MAGNESIUM SULFATE 2 GM/50ML IV SOLN
2.0000 g | Freq: Once | INTRAVENOUS | Status: AC
  Administered 2020-11-03: 2 g via INTRAVENOUS
  Filled 2020-11-03: qty 50

## 2020-11-03 MED ORDER — POTASSIUM CHLORIDE CRYS ER 20 MEQ PO TBCR
20.0000 meq | EXTENDED_RELEASE_TABLET | Freq: Once | ORAL | Status: AC
  Administered 2020-11-03: 20 meq via ORAL
  Filled 2020-11-03: qty 2

## 2020-11-03 NOTE — TOC CM/SW Note (Signed)
CSW left another VM for patient's son requesting return call to discuss SNF rec.  Oleh Genin, [place]

## 2020-11-03 NOTE — Progress Notes (Signed)
RN to bedside to encourage patient mobility, asked patient if she would like to sit to edge of bed and possibly up to chair, patient states she is "really dizzy and her head hurts." Will treat pain at this time and re-attempt mobility.

## 2020-11-03 NOTE — Progress Notes (Signed)
PROGRESS NOTE    [name]  FXT:[id number] DOB: 1940-04-09 DOA: 10/29/2020 PCP: Venia Carbon, MD   Assessment & Plan:   Active Problems:   Essential hypertension, benign   GERD   Fibromyalgia   Osteoarthritis, knee   Type 2 diabetes, uncontrolled, with neuropathy (HCC)   Iron deficiency anemia   Stage 3b chronic kidney disease (HCC)   Hemorrhagic stroke (HCC)   Cerebral brain hemorrhage (HCC)   Subacute hemorrhagic infarct: MRI brain shows 2.5 x 1.1 x 1.8 cm well-circumscribed lenticular lesion involving the right lentiform nucleus/external capsule, favored to reflect an evolving subacute intraparenchymal hematoma, possibly related to a subacute hemorrhagic infarct. Mild surrounding vasogenic edema without significant regional mass effect. CTA head & neck show suspected right external capsule hematoma & no progressive mass effect, no large vessel occlusion or significant stenosis in the head or neck. Neuro & neuro surg recs apprec. Continue w/ neuro checks. Echo shows EF 29-92%, grade I diastolic function & no atrial level shunts. PT/OT recs SNF   C1 fracture: likely secondary to recent fall. Continue w/ C collar.  Will need outpatient f/u w/ neuro surg and repeat MRI as an outpatient. Unable to do carotid US at this time as C collar cannot be removed currently to manipulate neck for test.    S/p fall and back pain: head/neck CT and MRI see above. Scalp laceration no need suture. C1 fracture on c collar, f/u w/ Dr. Lacinda Axon. Chest CTA and A/P  negative. Lumbar spine MRI on 9/18 showed degenerative changes.    Diarrhea: h/o taking augmentin for diverticulitis. GI PCR panel and c. diff are both neg. Imodium prn. GI consulted but no acute/emergent procedures will be done inpatient and pt will need to f/u outpatient   Hypokalemia: KCl repleated. Will continue to monitor  Hypomagnesemia: mag sulfate given. Will continue to monitor    HTN: continue on losartan, HCTZ, cardura.  Recently not taking meds    DM2: well controlled, HbA1c 7.0. Continue on SSI w/ accuchecks   Normocytic anemia: H&H are stable from day prior.   Thrombocytopenia: etiology unclear. Will continue to monitor   CKDIIIb: Cr is trending down from day prior   Fibromyalgia and chronic pain: continue on home dose of effexor, gabapentin      DVT prophylaxis: SCDs secondary to above Code Status: full  Family Communication:  Disposition Plan: likely d/c to SNF   Level of care: Progressive Cardiac  Status is: Inpatient  Remains inpatient appropriate because:Unsafe d/c plan, IV treatments appropriate due to intensity of illness or inability to take PO, and Inpatient level of care appropriate due to severity of illness, waiting on SNF placement   Dispo: The patient is from: Home              Anticipated d/c is to: unclear              Patient currently is medically stable for d/c    Difficult to place patient : unclear      Consultants:  Neuro Neuro surg   Procedures:   Antimicrobials:   Subjective: Pt c/o poor appetite   Objective: Vitals:   11/02/20 2000 11/02/20 2100 11/02/20 2200 11/02/20 2258  BP: (!) 149/71 (!) 160/92 (!) 162/73 (!) 149/68  Pulse: 65 81 69 78  Resp: 16 18 13 17   Temp: 98.5 F (36.9 C)   99.1 F (37.3 C)  TempSrc:    Oral  SpO2: 93% 96% 94% 95%  Weight:  Height:        Intake/Output Summary (Last 24 hours) at 11/03/2020 0737 Last data filed at 11/03/2020 5277 Gross per 24 hour  Intake 2355.17 ml  Output 675 ml  Net 1680.17 ml    Filed Weights   10/29/20 1447 11/01/20 2230  Weight: 79.4 kg 77.9 kg    Examination:  General exam: Appears comfortable  Respiratory system: clear breath sounds b/l  Cardiovascular system: S1 & S2+. No rubs or clicks  Gastrointestinal system: Abd is soft, NT, ND & hypoactive bowel sounds  Central nervous system: Alert and oriented. Moves all extremities  Psychiatry: Judgement and insight appear  normal. Flat mood and affect     Data Reviewed: I have personally reviewed following labs and imaging studies  CBC: Recent Labs  Lab 10/29/20 1947 10/30/20 0503 10/31/20 0703 11/01/20 0753 11/02/20 0401 11/03/20 0518  WBC 8.4 6.3 5.3 4.7 5.2 4.0  NEUTROABS 6.5  --   --   --   --   --   HGB 11.9* 10.6* 10.3* 10.2* 9.5* 9.5*  HCT 35.0* 31.1* 29.6* 30.0* 28.1* 28.2*  MCV 94.3 92.8 95.2 94.6 96.2 93.7  PLT 234 200 188 173 162 824*   Basic Metabolic Panel: Recent Labs  Lab 10/30/20 0503 10/31/20 0703 11/01/20 0753 11/02/20 0401 11/03/20 0518  NA 137 135 135 137 135  K 3.2* 3.5 3.5 3.9 3.4*  CL 104 99 100 103 103  CO2 22 20* 23 26 25   GLUCOSE 170* 206* 168* 143* 121*  BUN 14 19 22 23 16   CREATININE 1.06* 1.14* 1.40* 1.35* 1.02*  CALCIUM 7.9* 8.5* 8.5* 8.1* 8.0*  MG 1.9 1.9 1.8 1.7 1.5*   GFR: Estimated Creatinine Clearance: 45.2 mL/min (A) (by C-G formula based on SCr of 1.02 mg/dL (H)). Liver Function Tests: No results for input(s): AST, ALT, ALKPHOS, BILITOT, PROT, ALBUMIN in the last 168 hours.  No results for input(s): LIPASE, AMYLASE in the last 168 hours.  No results for input(s): AMMONIA in the last 168 hours. Coagulation Profile: No results for input(s): INR, PROTIME in the last 168 hours. Cardiac Enzymes: No results for input(s): CKTOTAL, CKMB, CKMBINDEX, TROPONINI in the last 168 hours. BNP (last 3 results) No results for input(s): PROBNP in the last 8760 hours. HbA1C: No results for input(s): HGBA1C in the last 72 hours.  CBG: Recent Labs  Lab 11/01/20 2218 11/02/20 0813 11/02/20 1156 11/02/20 1718 11/02/20 2102  GLUCAP 162* 120* 135* 158* 137*   Lipid Profile: No results for input(s): CHOL, HDL, LDLCALC, TRIG, CHOLHDL, LDLDIRECT in the last 72 hours.  Thyroid Function Tests: No results for input(s): TSH, T4TOTAL, FREET4, T3FREE, THYROIDAB in the last 72 hours. Anemia Panel: No results for input(s): VITAMINB12, FOLATE, FERRITIN, TIBC,  IRON, RETICCTPCT in the last 72 hours. Sepsis Labs: No results for input(s): PROCALCITON, LATICACIDVEN in the last 168 hours.  Recent Results (from the past 240 hour(s))  C Difficile Quick Screen w PCR reflex     Status: None   Collection Time: 10/29/20 10:14 PM   Specimen: Stool  Result Value Ref Range Status   C Diff antigen NEGATIVE NEGATIVE Final   C Diff toxin NEGATIVE NEGATIVE Final   C Diff interpretation No C. difficile detected.  Final    Comment: Performed at [facility], [address] [place], [place] [address]  Resp Panel by RT-PCR (Flu A&B, Covid) Nasopharyngeal Swab     Status: None   Collection Time: 10/29/20 11:31 PM   Specimen:  Nasopharyngeal Swab; Nasopharyngeal(NP) swabs in vial transport medium  Result Value Ref Range Status   SARS Coronavirus 2 by RT PCR NEGATIVE NEGATIVE Final    Comment: (NOTE) SARS-CoV-2 target nucleic acids are NOT DETECTED.  The SARS-CoV-2 RNA is generally detectable in upper respiratory specimens during the acute phase of infection. The lowest concentration of SARS-CoV-2 viral copies this assay can detect is 138 copies/mL. A negative result does not preclude SARS-Cov-2 infection and should not be used as the sole basis for treatment or other patient management decisions. A negative result may occur with  improper specimen collection/handling, submission of specimen other than nasopharyngeal swab, presence of viral mutation(s) within the areas targeted by this assay, and inadequate number of viral copies(<138 copies/mL). A negative result must be combined with clinical observations, patient history, and epidemiological information. The expected result is Negative.  Fact Sheet for Patients:  EntrepreneurPulse.com.au  Fact Sheet for Healthcare Providers:  IncredibleEmployment.be  This test is no t yet approved or cleared by the [place] FDA and  has been authorized for detection  and/or diagnosis of SARS-CoV-2 by FDA under an Emergency Use Authorization (EUA). This EUA will remain  in effect (meaning this test can be used) for the duration of the COVID-19 declaration under Section 564(b)(1) of the Act, 21 U.S.C.section 360bbb-3(b)(1), unless the authorization is terminated  or revoked sooner.       Influenza A by PCR NEGATIVE NEGATIVE Final   Influenza B by PCR NEGATIVE NEGATIVE Final    Comment: (NOTE) The Xpert Xpress SARS-CoV-2/FLU/RSV plus assay is intended as an aid in the diagnosis of influenza from Nasopharyngeal swab specimens and should not be used as a sole basis for treatment. Nasal washings and aspirates are unacceptable for Xpert Xpress SARS-CoV-2/FLU/RSV testing.  Fact Sheet for Patients: EntrepreneurPulse.com.au  Fact Sheet for Healthcare Providers: IncredibleEmployment.be  This test is not yet approved or cleared by the [place] FDA and has been authorized for detection and/or diagnosis of SARS-CoV-2 by FDA under an Emergency Use Authorization (EUA). This EUA will remain in effect (meaning this test can be used) for the duration of the COVID-19 declaration under Section 564(b)(1) of the Act, 21 U.S.C. section 360bbb-3(b)(1), unless the authorization is terminated or revoked.  Performed at [facility], [address] [place], [place] [address]   Gastrointestinal Panel by PCR , Stool     Status: None   Collection Time: 10/30/20 11:55 AM   Specimen: Stool  Result Value Ref Range Status   Campylobacter species NOT DETECTED NOT DETECTED Final   Plesimonas shigelloides NOT DETECTED NOT DETECTED Final   Salmonella species NOT DETECTED NOT DETECTED Final   Yersinia enterocolitica NOT DETECTED NOT DETECTED Final   Vibrio species NOT DETECTED NOT DETECTED Final   Vibrio cholerae NOT DETECTED NOT DETECTED Final   Enteroaggregative E coli (EAEC) NOT DETECTED NOT DETECTED Final    Enteropathogenic E coli (EPEC) NOT DETECTED NOT DETECTED Final   Enterotoxigenic E coli (ETEC) NOT DETECTED NOT DETECTED Final   Shiga like toxin producing E coli (STEC) NOT DETECTED NOT DETECTED Final   Shigella/Enteroinvasive E coli (EIEC) NOT DETECTED NOT DETECTED Final   Cryptosporidium NOT DETECTED NOT DETECTED Final   Cyclospora cayetanensis NOT DETECTED NOT DETECTED Final   Entamoeba histolytica NOT DETECTED NOT DETECTED Final   Giardia lamblia NOT DETECTED NOT DETECTED Final   Adenovirus F40/41 NOT DETECTED NOT DETECTED Final   Astrovirus NOT DETECTED NOT DETECTED Final   Norovirus GI/GII NOT DETECTED NOT  DETECTED Final   Rotavirus A NOT DETECTED NOT DETECTED Final   Sapovirus (I, II, IV, and V) NOT DETECTED NOT DETECTED Final    Comment: Performed at [facility], [address] [place], [place] [address]  MRSA Next Gen by PCR, Nasal     Status: None   Collection Time: 11/01/20  7:01 PM   Specimen: Nasal Mucosa; Nasal Swab  Result Value Ref Range Status   MRSA by PCR Next Gen NOT DETECTED NOT DETECTED Final    Comment: (NOTE) The GeneXpert MRSA Assay (FDA approved for NASAL specimens only), is one component of a comprehensive MRSA colonization surveillance program. It is not intended to diagnose MRSA infection nor to guide or monitor treatment for MRSA infections. Test performance is not FDA approved in patients less than 64 years old. Performed at [facility], [address] [place], [place] [address]          Radiology Studies: No results found.      Scheduled Meds:   stroke: mapping our early stages of recovery book   Does not apply Once   Chlorhexidine Gluconate Cloth  6 each Topical Daily   doxazosin  2 mg Oral QHS   feeding supplement  237 mL Oral TID BM   gabapentin  100 mg Oral BID   losartan  100 mg Oral Daily   And   hydrochlorothiazide  25 mg Oral Daily   insulin aspart  0-9 Units Subcutaneous TID WC   multivitamin with  minerals  1 tablet Oral Daily   potassium chloride  40 mEq Oral Once   venlafaxine  37.5 mg Oral BID   Continuous Infusions:  sodium chloride 75 mL/hr at 11/03/20 0635   ondansetron (ZOFRAN) IV Stopped (10/30/20 1159)     LOS: 5 days    Time spent: 20 mins     Wyvonnia Dusky, MD [facility] Pager [phone]  If 7PM-7AM, please contact night-coverage 11/03/2020, 7:37 AM

## 2020-11-04 ENCOUNTER — Telehealth: Payer: Self-pay

## 2020-11-04 DIAGNOSIS — I619 Nontraumatic intracerebral hemorrhage, unspecified: Secondary | ICD-10-CM | POA: Diagnosis not present

## 2020-11-04 DIAGNOSIS — I10 Essential (primary) hypertension: Secondary | ICD-10-CM | POA: Diagnosis not present

## 2020-11-04 DIAGNOSIS — S12001A Unspecified nondisplaced fracture of first cervical vertebra, initial encounter for closed fracture: Secondary | ICD-10-CM | POA: Diagnosis not present

## 2020-11-04 LAB — CBC
HCT: 27.1 % — ABNORMAL LOW (ref 36.0–46.0)
Hemoglobin: 9.3 g/dL — ABNORMAL LOW (ref 12.0–15.0)
MCH: 31.6 pg (ref 26.0–34.0)
MCHC: 34.3 g/dL (ref 30.0–36.0)
MCV: 92.2 fL (ref 80.0–100.0)
Platelets: 142 10*3/uL — ABNORMAL LOW (ref 150–400)
RBC: 2.94 MIL/uL — ABNORMAL LOW (ref 3.87–5.11)
RDW: 13.8 % (ref 11.5–15.5)
WBC: 5 10*3/uL (ref 4.0–10.5)
nRBC: 0 % (ref 0.0–0.2)

## 2020-11-04 LAB — BASIC METABOLIC PANEL
Anion gap: 8 (ref 5–15)
BUN: 13 mg/dL (ref 8–23)
CO2: 25 mmol/L (ref 22–32)
Calcium: 8 mg/dL — ABNORMAL LOW (ref 8.9–10.3)
Chloride: 103 mmol/L (ref 98–111)
Creatinine, Ser: 0.8 mg/dL (ref 0.44–1.00)
GFR, Estimated: >60 mL/min (ref >60)
Glucose, Bld: 139 mg/dL — ABNORMAL HIGH (ref 70–99)
Potassium: 3.2 mmol/L — ABNORMAL LOW (ref 3.5–5.1)
Sodium: 136 mmol/L (ref 135–145)

## 2020-11-04 LAB — GLUCOSE, CAPILLARY
Glucose-Capillary: 169 mg/dL — ABNORMAL HIGH (ref 70–99)
Glucose-Capillary: 191 mg/dL — ABNORMAL HIGH (ref 70–99)
Glucose-Capillary: 268 mg/dL — ABNORMAL HIGH (ref 70–99)
Glucose-Capillary: 193 mg/dL — ABNORMAL HIGH (ref 70–99)

## 2020-11-04 LAB — MAGNESIUM: Magnesium: 1.8 mg/dL (ref 1.7–2.4)

## 2020-11-04 MED ORDER — POTASSIUM CHLORIDE CRYS ER 20 MEQ PO TBCR
40.0000 meq | EXTENDED_RELEASE_TABLET | Freq: Once | ORAL | Status: AC
  Administered 2020-11-04: 40 meq via ORAL
  Filled 2020-11-04: qty 2

## 2020-11-04 MED ORDER — SODIUM CHLORIDE 0.9 % IV SOLN: INTRAVENOUS | Status: DC

## 2020-11-04 MED ORDER — MECLIZINE HCL 25 MG PO TABS
12.5000 mg | ORAL_TABLET | Freq: Two times a day (BID) | ORAL | Status: DC | PRN
  Administered 2020-11-04: 12.5 mg via ORAL
  Filled 2020-11-04 (×2): qty 0.5

## 2020-11-04 MED ORDER — DOCUSATE SODIUM 100 MG PO CAPS
200.0000 mg | ORAL_CAPSULE | Freq: Two times a day (BID) | ORAL | Status: DC
  Administered 2020-11-04 – 2020-11-07 (×7): 200 mg via ORAL
  Filled 2020-11-04 (×7): qty 2

## 2020-11-04 MED ORDER — SACCHAROMYCES BOULARDII 250 MG PO CAPS
250.0000 mg | ORAL_CAPSULE | Freq: Two times a day (BID) | ORAL | Status: DC
  Administered 2020-11-04 – 2020-11-07 (×6): 250 mg via ORAL
  Filled 2020-11-04 (×7): qty 1

## 2020-11-04 NOTE — Care Management Important Message (Signed)
Important Message  Patient Details  Name: RACHAL DVORSKY MRN: [id number] Date of Birth: 1940/11/19   Medicare Important Message Given:  Yes     Dannette Barbara 11/04/2020, 3:43 PM

## 2020-11-04 NOTE — Telephone Encounter (Signed)
Left message for pt to call the office to schedule an ER follow-up and to see how she was doing.

## 2020-11-04 NOTE — TOC Initial Note (Signed)
Transition of Care [facility]) - Initial/Assessment Note    Patient Details  Name: [name] MRN: [id number] Date of Birth: 1940/12/10  [facility] CM/SW Contact:    Eileen Stanford, LCSW Phone Number: 11/04/2020, 4:00 PM  Clinical Narrative:  CSW spoke with pt at bedside. Pt is agreeable to SNF placement. CSW read the Medicare list aloud to pt at bedside. Pt is agreeable to [facility] as she lives in [place] has sent referral.                 Expected Discharge Plan: Skilled Nursing Facility Barriers to Discharge: Continued Medical Work up   Patient Goals and CMS Choice Patient states their goals for this hospitalization and ongoing recovery are:: to go to [employer].gov Compare Post Acute Care list provided to:: Patient Choice offered to / list presented to : Patient  Expected Discharge Plan and Services Expected Discharge Plan: [place] In-house Referral: NA   Post Acute Care Choice: [place] Living arrangements for the past 2 months: Single Family Home                                      Prior Living Arrangements/Services Living arrangements for the past 2 months: Single Family Home Lives with:: Self Patient language and need for interpreter reviewed:: Yes Do you feel safe going back to the place where you live?: Yes      Need for Family Participation in Patient Care: Yes (Comment) Care giver support system in place?: Yes (comment)   Criminal Activity/Legal Involvement Pertinent to Current Situation/Hospitalization: No - Comment as needed  Activities of Daily Living Home Assistive Devices/Equipment: Wheelchair, Dentures (specify type), Shower chair with back ADL Screening (condition at time of admission) Patient's cognitive ability adequate to safely complete daily activities?: Yes Is the patient deaf or have difficulty hearing?: Yes Does the patient have difficulty seeing, even when wearing glasses/contacts?:  Yes Does the patient have difficulty concentrating, remembering, or making decisions?: No Patient able to express need for assistance with ADLs?: Yes Does the patient have difficulty dressing or bathing?: Yes Independently performs ADLs?: No Communication: Independent Dressing (OT): Needs assistance Is this a change from baseline?: Change from baseline, expected to last >3 days Grooming: Needs assistance Is this a change from baseline?: Change from baseline, expected to last >3 days Feeding: Independent Bathing: Needs assistance Is this a change from baseline?: Change from baseline, expected to last >3 days Toileting: Needs assistance Is this a change from baseline?: Change from baseline, expected to last >3days In/Out Bed: Needs assistance Is this a change from baseline?: Change from baseline, expected to last >3 days Walks in Home: Needs assistance Is this a change from baseline?: Pre-admission baseline Does the patient have difficulty walking or climbing stairs?: Yes Weakness of Legs: Both Weakness of Arms/Hands: Both  Permission Sought/Granted Permission sought to share information with : Family Supports Permission granted to share information with : Yes, Verbal Permission Granted  Share Information with NAME: [name]  Permission granted to share info w AGENCY: [facility]  Permission granted to share info w Relationship: Son     Emotional Assessment Appearance:: Appears stated age Attitude/Demeanor/Rapport: Engaged Affect (typically observed): Accepting Orientation: : Oriented to Place, Oriented to  Time, Oriented to Situation, Oriented to Self Alcohol / Substance Use: Not Applicable Psych Involvement: No (comment)  Admission diagnosis:  Hemorrhagic stroke ([place]) [  I61.9] Cerebral brain hemorrhage ([place]) [I61.9] Fall [W19.XXXA] Patient Active Problem List   Diagnosis Date Noted   Cerebral brain hemorrhage ([place]) 11/01/2020   Hemorrhagic stroke ([place]) 10/29/2020   Diarrhea  10/16/2020   Malnutrition of mild degree ([place]) 10/16/2020   Stage 3b chronic kidney disease ([place]) 12/25/2016   Right hand pain 07/12/2015   Advance directive discussed with patient 07/12/2015   Iron deficiency anemia 07/18/2014   Type 2 diabetes, uncontrolled, with neuropathy ([place]) 02/22/2012   Routine general medical examination at a health care facility 08/25/2011   Osteoarthritis, knee 08/25/2011   Fibromyalgia 02/25/2011   Obesity 02/25/2011   UTERINE PROLAPSE 02/25/2010   GERD 02/13/2009   CARCINOMA, BASAL CELL 05/18/2008   ALLERGIC RHINITIS 05/18/2008   Episodic mood disorder ([place]) 06/20/2007   Essential hypertension, benign 10/27/2006   PCP:  Venia Carbon, MD Pharmacy:   [facility] [place], [place] [address] Phone: [phone] Fax: [phone]  [facility] [address] (N), [place]) [place] [address] Phone: [phone] Fax: [phone]     Social Determinants of Health ([place]) Interventions    Readmission Risk Interventions No flowsheet data found.

## 2020-11-04 NOTE — NC FL2 (Signed)
[place] LEVEL OF CARE SCREENING TOOL     IDENTIFICATION  Patient Name: [name] Birthdate: 17-Jun-1940 Sex: female Admission Date (Current Location): 10/29/2020  [facility] and [place] Number:  Engineering geologist and Address:  [facility], [address] [place], [place] [address]      Provider Number: [id number]  Attending Physician Name and Address:  Wyvonnia Dusky, MD  Relative Name and Phone Number:       Current Level of Care: Hospital Recommended Level of Care: [place] Prior Approval Number:    Date Approved/Denied:   PASRR Number: [id number]  Discharge Plan: SNF    Current Diagnoses: Patient Active Problem List   Diagnosis Date Noted   Cerebral brain hemorrhage ([place]) 11/01/2020   Hemorrhagic stroke ([place]) 10/29/2020   Diarrhea 10/16/2020   Malnutrition of mild degree ([place]) 10/16/2020   Stage 3b chronic kidney disease ([place]) 12/25/2016   Right hand pain 07/12/2015   Advance directive discussed with patient 07/12/2015   Iron deficiency anemia 07/18/2014   Type 2 diabetes, uncontrolled, with neuropathy ([place]) 02/22/2012   Routine general medical examination at a health care facility 08/25/2011   Osteoarthritis, knee 08/25/2011   Fibromyalgia 02/25/2011   Obesity 02/25/2011   UTERINE PROLAPSE 02/25/2010   GERD 02/13/2009   CARCINOMA, BASAL CELL 05/18/2008   ALLERGIC RHINITIS 05/18/2008   Episodic mood disorder ([place]) 06/20/2007   Essential hypertension, benign 10/27/2006    Orientation RESPIRATION BLADDER Height & Weight     Self, Time, Situation, Place  Normal Continent Weight: 171 lb 11.8 oz (77.9 kg) Height:  5\' 4"  (162.6 cm)  BEHAVIORAL SYMPTOMS/MOOD NEUROLOGICAL BOWEL NUTRITION STATUS      Continent Diet (regular diet, thin liquids)  AMBULATORY STATUS COMMUNICATION OF NEEDS Skin   Limited Assist Verbally Normal                       Personal Care Assistance Level of  Assistance  Bathing, Feeding, Dressing Bathing Assistance: Limited assistance Feeding assistance: Independent Dressing Assistance: Limited assistance     Functional Limitations Info  Sight, Hearing, Speech Sight Info: Adequate Hearing Info: Adequate Speech Info: Adequate    SPECIAL CARE FACTORS FREQUENCY  PT (By licensed PT), OT (By licensed OT)     PT Frequency: 5x OT Frequency: 5x            Contractures Contractures Info: Not present    Additional Factors Info  Code Status, Allergies Code Status Info: full code Allergies Info: Diphenhydramine Hcl, Fluoxetine Hcl, Lisinopril, Sertraline Hcl           Current Medications (11/04/2020):  This is the current hospital active medication list Current Facility-Administered Medications  Medication Dose Route Frequency Provider Last Rate Last Admin    stroke: mapping our early stages of recovery book   Does not apply Once Anda Latina, MD       0.9 %  sodium chloride infusion   Intravenous Continuous Wyvonnia Dusky, MD       acetaminophen (TYLENOL) tablet 650 mg  650 mg Oral Q4H PRN Anda Latina, MD   650 mg at 11/04/20 1427   Or   acetaminophen (TYLENOL) 160 MG/5ML solution 650 mg  650 mg Per Tube Q4H PRN Anda Latina, MD       Or   acetaminophen (TYLENOL) suppository 650 mg  650 mg Rectal Q4H PRN Anda Latina, MD       Chlorhexidine Gluconate Cloth 2 %  PADS 6 each  6 each Topical Daily Sharion Settler, NP   6 each at 11/04/20 0851   docusate sodium (COLACE) capsule 200 mg  200 mg Oral BID Wyvonnia Dusky, MD   200 mg at 11/04/20 1244   doxazosin (CARDURA) tablet 2 mg  2 mg Oral QHS Anda Latina, MD   2 mg at 11/03/20 2112   feeding supplement (ENSURE ENLIVE / ENSURE PLUS) liquid 237 mL  237 mL Oral TID BM Wyvonnia Dusky, MD   237 mL at 11/04/20 1244   gabapentin (NEURONTIN) capsule 100 mg  100 mg Oral BID Anda Latina, MD   100 mg at 11/04/20 0850   losartan (COZAAR) tablet 100 mg  100 mg Oral Daily Renda Rolls, RPH   100 mg at 11/04/20 7741   And   hydrochlorothiazide (HYDRODIURIL) tablet 25 mg  25 mg Oral Daily Renda Rolls, RPH   25 mg at 11/04/20 0851   insulin aspart (novoLOG) injection 0-9 Units  0-9 Units Subcutaneous TID WC Wyvonnia Dusky, MD   2 Units at 11/04/20 1244   labetalol (NORMODYNE) injection 10 mg  10 mg Intravenous Q4H PRN Anda Latina, MD   10 mg at 11/04/20 1246   meclizine (ANTIVERT) tablet 12.5 mg  12.5 mg Oral BID PRN Wyvonnia Dusky, MD   12.5 mg at 11/04/20 1425   multivitamin with minerals tablet 1 tablet  1 tablet Oral Daily Wyvonnia Dusky, MD   1 tablet at 11/04/20 0851   ondansetron (ZOFRAN) 8 mg in sodium chloride 0.9 % 50 mL IVPB  8 mg Intravenous Q8H PRN Wyvonnia Dusky, MD   Stopped at 10/30/20 1159   oxyCODONE-acetaminophen (PERCOCET/ROXICET) 5-325 MG per tablet 1 tablet  1 tablet Oral Q6H PRN Anda Latina, MD   1 tablet at 11/04/20 1249   saccharomyces boulardii (FLORASTOR) capsule 250 mg  250 mg Oral BID Wyvonnia Dusky, MD       venlafaxine [facility]) tablet 37.5 mg  37.5 mg Oral BID Anda Latina, MD   37.5 mg at 11/04/20 2878     Discharge Medications: Please see discharge summary for a list of discharge medications.  Relevant Imaging Results:  Relevant Lab Results:   Additional Information [id number]  Malyah Ohlrich A Jesiah Grismer, LCSW

## 2020-11-04 NOTE — Progress Notes (Addendum)
Physical Therapy Treatment Patient Details Name: [name] MRN: [id number] DOB: 1940/09/25 Today's Date: 11/04/2020   History of Present Illness 80 y/o female with PMH significant for DM, HTN and arthritis.  Pt came in after a fall; apparently she was going to the MD and son was using w/c to get her down the steps when she tipped back and hit her head.  Imaging reveals C1 body fracture as well as possible hemorrhagic stroke.  Pt had Covid ~2 months prior to admission and apparently has been very weak (nearly w/c bound) and not able to eat in that time.    PT Comments    Pt alert in bed, agreeable to therapy session. Pt requires slight MIN-A for trunk support but able to mobilize BLE with good effort for managing trunk. Upon sitting, pt notes 8/10 dizziness, orthostatics assessed (see below), nystagmus absent, but decreased  occular motion laterally w/ both eyes during CN III assessment. Pt agreeable to stand w/ MIN-G from elevated surface, MIN--A from bed height, RW. Transferred to chair w/ RW taking side steps w/ MIN-A for managing RW. Dizziness was present throughout treatment, dissipated end of session to 5/10. RN notified of symptoms. Discharge recommendations remain SNF.  Vitals (Supine) 128/65, 70bpm, 97% (Seated) 136/72 (Standing) 95/62 (Seated) 113/82   Recommendations for follow up therapy are one component of a multi-disciplinary discharge planning process, led by the attending physician.  Recommendations may be updated based on patient status, additional functional criteria and insurance authorization.  Follow Up Recommendations  SNF;Supervision/Assistance - 24 hour     Equipment Recommendations  Other (comment) (TBD next venue of care)    Recommendations for Other Services       Precautions / Restrictions Precautions Precautions: Fall Required Braces or Orthoses: Cervical Brace Restrictions Weight Bearing Restrictions: No     Mobility  Bed Mobility Overal bed  mobility: Needs Assistance Bed Mobility: Supine to Sit     Supine to sit: Min assist     General bed mobility comments: Able to scoot to EOB and position body for upright sitting with physical assist for trunk    Transfers Overall transfer level: Needs assistance Equipment used: Rolling walker (2 wheeled) Transfers: Sit to/from Stand Sit to Stand: Min assist;From elevated surface;Min guard         General transfer comment: MIN A from bed height, MIN G from elevated surface; increased dizziness w/ standing  Ambulation/Gait Ambulation/Gait assistance: Min assist;+2 safety/equipment Gait Distance (Feet): 3 Feet Assistive device: Rolling walker (2 wheeled) Gait Pattern/deviations: Step-to pattern     General Gait Details: Min A for managing RW bed > recliner   Stairs             Wheelchair Mobility    Modified Rankin (Stroke Patients Only)       Balance Overall balance assessment: Needs assistance Sitting-balance support: Single extremity supported;Feet supported Sitting balance-Leahy Scale: Fair Sitting balance - Comments: No LOB but slight L trunk lean, could not tolerate dynamic challenges secondary to dizziness     Standing balance-Leahy Scale: Poor Standing balance comment: Increased dizziness with standing requires support, able to stand ~ 1 minute to assess vitals                            Cognition Arousal/Alertness: Awake/alert Behavior During Therapy: WFL for tasks assessed/performed Overall Cognitive Status: Within Functional Limits for tasks assessed  Exercises      General Comments        Pertinent Vitals/Pain Pain Assessment: 0-10 Pain Score: 7  Pain Location: headache Pain Descriptors / Indicators: Squeezing;Throbbing;Aching Pain Intervention(s): Limited activity within patient's tolerance;Monitored during session;Repositioned;Premedicated before session    Home  Living                      Prior Function            PT Goals (current goals can now be found in the care plan section) Progress towards PT goals: Progressing toward goals    Frequency    Min 2X/week      PT Plan Current plan remains appropriate    Co-evaluation              AM-PAC PT "6 Clicks" Mobility   Outcome Measure  Help needed turning from your back to your side while in a flat bed without using bedrails?: A Little Help needed moving from lying on your back to sitting on the side of a flat bed without using bedrails?: A Little Help needed moving to and from a bed to a chair (including a wheelchair)?: A Lot Help needed standing up from a chair using your arms (e.g., wheelchair or bedside chair)?: A Little Help needed to walk in hospital room?: A Lot Help needed climbing 3-5 steps with a railing? : Total 6 Click Score: 14    End of Session Equipment Utilized During Treatment: Gait belt Activity Tolerance: Other (comment) (Limited by dizziness)   Nurse Communication: Mobility status PT Visit Diagnosis: Muscle weakness (generalized) (M62.81);Difficulty in walking, not elsewhere classified (R26.2);Unsteadiness on feet (R26.81);Dizziness and giddiness (R42);Other symptoms and signs involving the nervous system (R29.898)     Time: 5638-7564 PT Time Calculation (min) (ACUTE ONLY): 31 min  Charges:                       [employer], SPT

## 2020-11-04 NOTE — Plan of Care (Signed)
  Problem: Elimination: Goal: Will not experience complications related to bowel motility Outcome: Not Progressing Goal: Will not experience complications related to urinary retention Outcome: Not Progressing   Problem: Education: Goal: Knowledge of disease or condition will improve Outcome: Progressing   Problem: Nutrition: Goal: Dietary intake will improve Outcome: Progressing

## 2020-11-04 NOTE — TOC Progression Note (Signed)
Transition of Care [facility]) - Progression Note    Patient Details  Name: [name] MRN: [id number] Date of Birth: 1940/04/28  [facility]) CM/SW Contact  Eileen Stanford, LCSW Phone Number: 11/04/2020, 4:32 PM  Clinical Narrative:  [facility] will accept. Auth started through [facility] portal.     Expected Discharge Plan: [place] Barriers to Discharge: Continued Medical Work up  Expected Discharge Plan and Services Expected Discharge Plan: [place] In-house Referral: NA   Post Acute Care Choice: [place] Living arrangements for the past 2 months: Single Family Home                                       Social Determinants of Health (SDOH) Interventions    Readmission Risk Interventions No flowsheet data found.

## 2020-11-04 NOTE — Progress Notes (Addendum)
PROGRESS NOTE    [name]  KKX:[id number] DOB: [birth year] DOA: 10/29/2020 PCP: Anna Carbon, MD   Assessment & Plan:   Active Problems:   Essential hypertension, benign   GERD   Fibromyalgia   Osteoarthritis, knee   Type 2 diabetes, uncontrolled, with neuropathy (HCC)   Iron deficiency anemia   Stage 3b chronic kidney disease (HCC)   Hemorrhagic stroke (HCC)   Cerebral brain hemorrhage (HCC)   Subacute hemorrhagic infarct: MRI brain shows 2.5 x 1.1 x 1.8 cm well-circumscribed lenticular lesion involving the right lentiform nucleus/external capsule, favored to reflect an evolving subacute intraparenchymal hematoma, possibly related to a subacute hemorrhagic infarct. Mild surrounding vasogenic edema without significant regional mass effect. CTA head & neck show suspected right external capsule hematoma & no progressive mass effect, no large vessel occlusion or significant stenosis in the head or neck. Neuro & neuro surg recs apprec. Continue w/ neuro checks. Echo shows EF 16-96%, grade I diastolic function & no atrial level shunts. PT/OT recs SNF and waiting on SNF placement currently   C1 fracture: likely secondary to recent fall. Continue w/ C collar.  Will need outpatient f/u w/ neuro surg and repeat MRI as an outpatient. Unable to do carotid US at this time as C collar cannot be removed currently to manipulate neck for test.    S/p fall and back pain: head/neck CT and MRI see above. Scalp laceration no need suture. C1 fracture on c collar, f/u w/ Dr. Lacinda Mccarty. Chest CTA and A/P  negative. Lumbar spine MRI on 9/18 showed degenerative changes.    Diarrhea: h/o taking augmentin for diverticulitis. GI PCR panel and c. diff are both neg. Much improved w/ imodium prn. GI consulted but no acute/emergent procedures will be done inpatient and pt will need to f/u outpatient   Hypokalemia: potassium given. Mg is WNL   Hypomagnesemia: WNL today    HTN: continue on cardura, losartan,  HCTZ. Recently not taking meds continue on losartan, HCTZ, cardura. Recently not taking meds    DM2: HbA1c 7.0, well controlled. Continue on SSI w/ accuchecks   Normocytic anemia: H&H are labile. Will transfuse if Hb <7.0.  Thrombocytopenia: stable from day prior, etiology unclear  CKDIIIb: Cr is better than baseline   Fibromyalgia and chronic pain: continue on home dose of effexor, gabapentin      DVT prophylaxis: SCDs secondary to above Code Status: full  Family Communication:  Disposition Plan: likely d/c to SNF   Level of care: Progressive Cardiac  Status is: Inpatient  Remains inpatient appropriate because:Unsafe d/c plan, IV treatments appropriate due to intensity of illness or inability to take PO, and Inpatient level of care appropriate due to severity of illness, waiting on SNF placement   Dispo: The patient is from: Home              Anticipated d/c is to: unclear              Patient currently is medically stable for d/c    Difficult to place patient : unclear      Consultants:  Neuro Neuro surg   Procedures:   Antimicrobials:   Subjective: Pt c/o intermittent dizziness   Objective: Vitals:   11/03/20 1634 11/03/20 2113 11/04/20 0010 11/04/20 0339  BP: (!) 150/75 (!) 156/76 140/69 131/73  Pulse: 77 74 75 77  Resp: 16 18 18 18   Temp: 98.4 F (36.9 C) 98.5 F (36.9 C) 98.2 F (36.8 C) 98.3 F (36.8 C)  TempSrc: Oral Oral Oral Oral  SpO2: 96% 96% 96% 94%  Weight:      Height:        Intake/Output Summary (Last 24 hours) at 11/04/2020 0754 Last data filed at 11/04/2020 0700 Gross per 24 hour  Intake 2019.72 ml  Output 2350 ml  Net -330.28 ml    Filed Weights   10/29/20 1447 11/01/20 2230  Weight: 79.4 kg 77.9 kg    Examination:  General exam: Appears uncomfortable  Respiratory system: clear breath sounds b/l  Cardiovascular system: S1/S2+. No rubs or clicks  Gastrointestinal system: Abd is soft, NT, ND & normal bowel sounds   Central nervous system: Alert and oriented. Moves all extremities  Psychiatry: Judgement and insight appear normal. Flat mood and affect    Data Reviewed: I have personally reviewed following labs and imaging studies  CBC: Recent Labs  Lab 10/29/20 1947 10/30/20 0503 10/31/20 0703 11/01/20 0753 11/02/20 0401 11/03/20 0518 11/04/20 0429  WBC 8.4   < > 5.3 4.7 5.2 4.0 5.0  NEUTROABS 6.5  --   --   --   --   --   --   HGB 11.9*   < > 10.3* 10.2* 9.5* 9.5* 9.3*  HCT 35.0*   < > 29.6* 30.0* 28.1* 28.2* 27.1*  MCV 94.3   < > 95.2 94.6 96.2 93.7 92.2  PLT 234   < > 188 173 162 142* 142*   < > = values in this interval not displayed.   Basic Metabolic Panel: Recent Labs  Lab 10/31/20 0703 11/01/20 0753 11/02/20 0401 11/03/20 0518 11/04/20 0429  NA 135 135 137 135 136  K 3.5 3.5 3.9 3.4* 3.2*  CL 99 100 103 103 103  CO2 20* 23 26 25 25   GLUCOSE 206* 168* 143* 121* 139*  BUN 19 22 23 16 13   CREATININE 1.14* 1.40* 1.35* 1.02* 0.80  CALCIUM 8.5* 8.5* 8.1* 8.0* 8.0*  MG 1.9 1.8 1.7 1.5* 1.8   GFR: Estimated Creatinine Clearance: 57.6 mL/min (by C-G formula based on SCr of 0.8 mg/dL). Liver Function Tests: No results for input(s): AST, ALT, ALKPHOS, BILITOT, PROT, ALBUMIN in the last 168 hours.  No results for input(s): LIPASE, AMYLASE in the last 168 hours.  No results for input(s): AMMONIA in the last 168 hours. Coagulation Profile: No results for input(s): INR, PROTIME in the last 168 hours. Cardiac Enzymes: No results for input(s): CKTOTAL, CKMB, CKMBINDEX, TROPONINI in the last 168 hours. BNP (last 3 results) No results for input(s): PROBNP in the last 8760 hours. HbA1C: No results for input(s): HGBA1C in the last 72 hours.  CBG: Recent Labs  Lab 11/02/20 2102 11/03/20 0829 11/03/20 1127 11/03/20 1633 11/03/20 2102  GLUCAP 137* 126* 182* 231* 182*   Lipid Profile: No results for input(s): CHOL, HDL, LDLCALC, TRIG, CHOLHDL, LDLDIRECT in the last 72  hours.  Thyroid Function Tests: No results for input(s): TSH, T4TOTAL, FREET4, T3FREE, THYROIDAB in the last 72 hours. Anemia Panel: No results for input(s): VITAMINB12, FOLATE, FERRITIN, TIBC, IRON, RETICCTPCT in the last 72 hours. Sepsis Labs: No results for input(s): PROCALCITON, LATICACIDVEN in the last 168 hours.  Recent Results (from the past 240 hour(s))  C Difficile Quick Screen w PCR reflex     Status: None   Collection Time: 10/29/20 10:14 PM   Specimen: Stool  Result Value Ref Range Status   C Diff antigen NEGATIVE NEGATIVE Final   C Diff toxin NEGATIVE NEGATIVE Final   C Diff  interpretation No C. difficile detected.  Final    Comment: Performed at [facility], [address] [place], [place] [address]  Resp Panel by RT-PCR (Flu A&B, Covid) Nasopharyngeal Swab     Status: None   Collection Time: 10/29/20 11:31 PM   Specimen: Nasopharyngeal Swab; Nasopharyngeal(NP) swabs in vial transport medium  Result Value Ref Range Status   SARS Coronavirus 2 by RT PCR NEGATIVE NEGATIVE Final    Comment: (NOTE) SARS-CoV-2 target nucleic acids are NOT DETECTED.  The SARS-CoV-2 RNA is generally detectable in upper respiratory specimens during the acute phase of infection. The lowest concentration of SARS-CoV-2 viral copies this assay can detect is 138 copies/mL. A negative result does not preclude SARS-Cov-2 infection and should not be used as the sole basis for treatment or other patient management decisions. A negative result may occur with  improper specimen collection/handling, submission of specimen other than nasopharyngeal swab, presence of viral mutation(s) within the areas targeted by this assay, and inadequate number of viral copies(<138 copies/mL). A negative result must be combined with clinical observations, patient history, and epidemiological information. The expected result is Negative.  Fact Sheet for Patients:   EntrepreneurPulse.com.au  Fact Sheet for Healthcare Providers:  IncredibleEmployment.be  This test is no t yet approved or cleared by the [place] FDA and  has been authorized for detection and/or diagnosis of SARS-CoV-2 by FDA under an Emergency Use Authorization (EUA). This EUA will remain  in effect (meaning this test can be used) for the duration of the COVID-19 declaration under Section 564(b)(1) of the Act, 21 U.S.C.section 360bbb-3(b)(1), unless the authorization is terminated  or revoked sooner.       Influenza A by PCR NEGATIVE NEGATIVE Final   Influenza B by PCR NEGATIVE NEGATIVE Final    Comment: (NOTE) The Xpert Xpress SARS-CoV-2/FLU/RSV plus assay is intended as an aid in the diagnosis of influenza from Nasopharyngeal swab specimens and should not be used as a sole basis for treatment. Nasal washings and aspirates are unacceptable for Xpert Xpress SARS-CoV-2/FLU/RSV testing.  Fact Sheet for Patients: EntrepreneurPulse.com.au  Fact Sheet for Healthcare Providers: IncredibleEmployment.be  This test is not yet approved or cleared by the [place] FDA and has been authorized for detection and/or diagnosis of SARS-CoV-2 by FDA under an Emergency Use Authorization (EUA). This EUA will remain in effect (meaning this test can be used) for the duration of the COVID-19 declaration under Section 564(b)(1) of the Act, 21 U.S.C. section 360bbb-3(b)(1), unless the authorization is terminated or revoked.  Performed at [facility], [address] [place], [place] [address]   Gastrointestinal Panel by PCR , Stool     Status: None   Collection Time: 10/30/20 11:55 AM   Specimen: Stool  Result Value Ref Range Status   Campylobacter species NOT DETECTED NOT DETECTED Final   Plesimonas shigelloides NOT DETECTED NOT DETECTED Final   Salmonella species NOT DETECTED NOT DETECTED Final    Yersinia enterocolitica NOT DETECTED NOT DETECTED Final   Vibrio species NOT DETECTED NOT DETECTED Final   Vibrio cholerae NOT DETECTED NOT DETECTED Final   Enteroaggregative E coli (EAEC) NOT DETECTED NOT DETECTED Final   Enteropathogenic E coli (EPEC) NOT DETECTED NOT DETECTED Final   Enterotoxigenic E coli (ETEC) NOT DETECTED NOT DETECTED Final   Shiga like toxin producing E coli (STEC) NOT DETECTED NOT DETECTED Final   Shigella/Enteroinvasive E coli (EIEC) NOT DETECTED NOT DETECTED Final   Cryptosporidium NOT DETECTED NOT DETECTED Final  Cyclospora cayetanensis NOT DETECTED NOT DETECTED Final   Entamoeba histolytica NOT DETECTED NOT DETECTED Final   Giardia lamblia NOT DETECTED NOT DETECTED Final   Adenovirus F40/41 NOT DETECTED NOT DETECTED Final   Astrovirus NOT DETECTED NOT DETECTED Final   Norovirus GI/GII NOT DETECTED NOT DETECTED Final   Rotavirus A NOT DETECTED NOT DETECTED Final   Sapovirus (I, II, IV, and V) NOT DETECTED NOT DETECTED Final    Comment: Performed at [facility], [address] [place], [place] [address]  MRSA Next Gen by PCR, Nasal     Status: None   Collection Time: 11/01/20  7:01 PM   Specimen: Nasal Mucosa; Nasal Swab  Result Value Ref Range Status   MRSA by PCR Next Gen NOT DETECTED NOT DETECTED Final    Comment: (NOTE) The GeneXpert MRSA Assay (FDA approved for NASAL specimens only), is one component of a comprehensive MRSA colonization surveillance program. It is not intended to diagnose MRSA infection nor to guide or monitor treatment for MRSA infections. Test performance is not FDA approved in patients less than 27 years old. Performed at [facility], [address] [place],  [address]          Radiology Studies: No results found.      Scheduled Meds:   stroke: mapping our early stages of recovery book   Does not apply Once   Chlorhexidine Gluconate Cloth  6 each Topical Daily   doxazosin  2 mg Oral  QHS   feeding supplement  237 mL Oral TID BM   gabapentin  100 mg Oral BID   losartan  100 mg Oral Daily   And   hydrochlorothiazide  25 mg Oral Daily   insulin aspart  0-9 Units Subcutaneous TID WC   multivitamin with minerals  1 tablet Oral Daily   venlafaxine  37.5 mg Oral BID   Continuous Infusions:  sodium chloride 75 mL/hr at 11/04/20 0658   ondansetron (ZOFRAN) IV Stopped (10/30/20 1159)     LOS: 6 days    Time spent: 20 mins     Wyvonnia Dusky, MD [facility] Pager [phone]  If 7PM-7AM, please contact night-coverage 11/04/2020, 7:54 AM

## 2020-11-05 DIAGNOSIS — I10 Essential (primary) hypertension: Secondary | ICD-10-CM | POA: Diagnosis not present

## 2020-11-05 DIAGNOSIS — I619 Nontraumatic intracerebral hemorrhage, unspecified: Secondary | ICD-10-CM | POA: Diagnosis not present

## 2020-11-05 DIAGNOSIS — S12001A Unspecified nondisplaced fracture of first cervical vertebra, initial encounter for closed fracture: Secondary | ICD-10-CM | POA: Diagnosis not present

## 2020-11-05 LAB — BASIC METABOLIC PANEL
Anion gap: 8 (ref 5–15)
BUN: 12 mg/dL (ref 8–23)
CO2: 27 mmol/L (ref 22–32)
Calcium: 8.3 mg/dL — ABNORMAL LOW (ref 8.9–10.3)
Chloride: 101 mmol/L (ref 98–111)
Creatinine, Ser: 0.85 mg/dL (ref 0.44–1.00)
GFR, Estimated: >60 mL/min (ref >60)
Glucose, Bld: 162 mg/dL — ABNORMAL HIGH (ref 70–99)
Potassium: 4 mmol/L (ref 3.5–5.1)
Sodium: 136 mmol/L (ref 135–145)

## 2020-11-05 LAB — CBC
HCT: 29.3 % — ABNORMAL LOW (ref 36.0–46.0)
Hemoglobin: 9.8 g/dL — ABNORMAL LOW (ref 12.0–15.0)
MCH: 31.1 pg (ref 26.0–34.0)
MCHC: 33.4 g/dL (ref 30.0–36.0)
MCV: 93 fL (ref 80.0–100.0)
Platelets: 155 10*3/uL (ref 150–400)
RBC: 3.15 MIL/uL — ABNORMAL LOW (ref 3.87–5.11)
RDW: 14.4 % (ref 11.5–15.5)
WBC: 6 10*3/uL (ref 4.0–10.5)
nRBC: 0 % (ref 0.0–0.2)

## 2020-11-05 LAB — GLUCOSE, CAPILLARY
Glucose-Capillary: 161 mg/dL — ABNORMAL HIGH (ref 70–99)
Glucose-Capillary: 187 mg/dL — ABNORMAL HIGH (ref 70–99)
Glucose-Capillary: 268 mg/dL — ABNORMAL HIGH (ref 70–99)
Glucose-Capillary: 283 mg/dL — ABNORMAL HIGH (ref 70–99)

## 2020-11-05 LAB — RESP PANEL BY RT-PCR (FLU A&B, COVID) ARPGX2
Influenza A by PCR: NEGATIVE
Influenza B by PCR: NEGATIVE
SARS Coronavirus 2 by RT PCR: POSITIVE — AB

## 2020-11-05 MED ORDER — OXYCODONE-ACETAMINOPHEN 5-325 MG PO TABS: 1.0000 | ORAL_TABLET | Freq: Four times a day (QID) | ORAL | 0 refills | Status: DC | PRN

## 2020-11-05 NOTE — Telephone Encounter (Signed)
Pt is currently in the hospital. 

## 2020-11-05 NOTE — Progress Notes (Signed)
Inpatient Diabetes Program Recommendations  AACE/ADA: New Consensus Statement on Inpatient Glycemic Control   Target Ranges:  Prepandial:   less than 140 mg/dL      [facility] postprandial:   less than 180 mg/dL (1-2 hours)      Critically ill patients:  140 - 180 mg/dL   Results for DIALA, WAXMAN (MRN [id number]) as of 11/05/2020 09:56  Ref. Range 11/04/2020 08:19 11/04/2020 12:01 11/04/2020 15:29 11/04/2020 20:44 11/05/2020 09:40  Glucose-Capillary Latest Ref Range: 70 - 99 mg/dL 169 (H) 191 (H) 268 (H) 193 (H) 161 (H)   Review of Glycemic Control  Diabetes history: DM2 hx Outpatient Diabetes medications: Metformin 1500 mg daily, Glipizide XL 10 mg daily (per home med list not taking Metformin nor Glipizide) Current orders for Inpatient glycemic control: Novolog 0-9 units TID with meals  Inpatient Diabetes Program Recommendations:    Diet: Post prandial glucose consistently elevated. Please consider discontinuing Regular diet and ordering Carb Modified diet if appropriate. If change is made and post prandial glucose continues to be consistently elevated, may need to add low dose meal coverage insulin.  Thanks, Barnie Alderman, RN, MSN, CDE Diabetes Coordinator Inpatient Diabetes Program [phone] (Team Pager from 8am to 5pm)

## 2020-11-05 NOTE — Discharge Summary (Signed)
Physician Discharge Summary  [name] OEV:[id number] DOB: 26-Nov-1940 DOA: 10/29/2020  PCP: Venia Carbon, MD  Admit date: 10/29/2020 Discharge date: 11/05/2020  Admitted From: home  Disposition:  SNF  Recommendations for Outpatient Follow-up:  Follow up with PCP in 1-2 weeks F/u w/ neuro surg, Dr. Lacinda Axon, in 1-2 weeks. Will need repeat MRI F/u w/ neuro, Dr. Melrose Nakayama or Dr. Manuella Ghazi in 1-2 weeks F/u w/ GI, Dr. Vicente Males, f/u in 4-6 weeks   Home Health: no  Equipment/Devices: C-collar   Discharge Condition: stable  CODE STATUS: full  Diet recommendation: Heart Healthy / Carb Modified   Brief/Interim Summary: HPI was taken from Dr. Starlyn Skeans: [name] is a 80 y.o. female with medical history significant of HTN, HLD, T2DM, fibromyalgia, arthritis, iron deficiency anemia, CKD3b, GERD, COVID-19 infection and diverticulitis in Aug this year, presented to ED with back and neck pain after fall.    Pt fell backwards from wheel chair to brick steps while her son was wheeling her down the steps. She hit the back of her head, resulted some bleeding from scalp laceration. No loss of consciousness. She c/o back and neck pain.  She denies focal weakness numbness, difficulty speaking or blurry vision. she denies chest pain, cough, SOB, abdominal pain, nausea or vomit. She reports having physical decline for about 2 months because of COVID-19 infection and diverticulitis. Per chart review, A/P CT on 8/19 showed diverticulitis. COVID-19 positive on 8/20.  She has been having diarrhea, non bloody, multiple times a day, worse after food intake. She has been eating poorly, has not been taking her medications. She reports some abdominal cramping after diarrhea. She took a course of Augmentin, only made her diarrhea worse. She becomes generally weak, not able to ambulate. She felt off bed 2 weeks ago, resulted in worsening back pain. Lumbar spine MRI on 9/18 showed degenerative changes.    ED Course: she was  afebrile, BP elevated 148/88-190/90, Hgb 11.9, at baseline, no leukocytosis, BUN/Cr 16/1.24 close to baseline, troponin 18, head CT noted mass liked lesion and nondisplaced C1 acute fracture. Head MRI: 2.5 x 1.1 x 1.8 cm well-circumscribed lenticular lesion involving the right lentiform nucleus/external capsule, favored to reflect an evolving subacute intraparenchymal hematoma, possibly related to a subacute hemorrhagic infarct. Mild surrounding vasogenic edema without significant regional mass effect.  ED provider communicated with neurosurgery Dr. Lacinda Axon, who recommended admit for stroke work up.  Outpatient follow-up for C1 fracture. C collar applied.  Patient received Tylenol, fentanyl, 10 mg labetalol IV and Zofran in ED.   Hospital course from Dr. Jimmye Norman 9/21-9/27/22: Pt was found to have subacute hemorrhagic infarct and C1 fracture. Pt was put in C-collar and this should continue to until d/c by neuro surg.  Pt had recent falls at home. Repeat imaging inpatient showed stabilization of the hemorrhage. Pt will need to f/u w/ neuro surg as well as neuro as an outpatient and will get a repeat MRI at time as well. Pt and pt's son verbalized their understanding. Etiology of the hemorrhagic infarct is unknown, fall/trauma vs uncontrolled HTN. Furthermore, pt has chronic diarrhea and was not able to make her appt outpatient to see GI and as a result GI was consulted inpatient but no procedures were recommended acutely secondary to above and below stated problems. Pt should f/u outpatient w/ GI in 4-6 weeks. For more information, please see previous progress/consult notes.   Discharge Diagnoses:  Active Problems:   Essential hypertension, benign   GERD  Fibromyalgia   Osteoarthritis, knee   Type 2 diabetes, uncontrolled, with neuropathy (HCC)   Iron deficiency anemia   Stage 3b chronic kidney disease ([place])   Hemorrhagic stroke (HCC)   Cerebral brain hemorrhage (HCC)   Subacute hemorrhagic infarct:  MRI brain shows 2.5 x 1.1 x 1.8 cm well-circumscribed lenticular lesion involving the right lentiform nucleus/external capsule, favored to reflect an evolving subacute intraparenchymal hematoma, possibly related to a subacute hemorrhagic infarct. Mild surrounding vasogenic edema without significant regional mass effect. CTA head & neck show suspected right external capsule hematoma & no progressive mass effect, no large vessel occlusion or significant stenosis in the head or neck. Neuro & neuro surg recs apprec. Continue w/ neuro checks. Echo shows EF 08-67%, grade I diastolic function & no atrial level shunts. PT/OT recs SNF and waiting on SNF placement currently   C1 fracture: likely secondary to recent fall. Continue w/ C collar.  Will need outpatient f/u w/ neuro surg and repeat MRI as an outpatient. Unable to do carotid US at this time as C collar cannot be removed currently to manipulate neck for test.    S/p fall and back pain: head/neck CT and MRI see above. Scalp laceration no need suture. C1 fracture on c collar, f/u w/ Dr. Lacinda Axon. Chest CTA and A/P  negative. Lumbar spine MRI on 9/18 showed degenerative changes.    Diarrhea: h/o taking augmentin for diverticulitis. GI PCR panel and c. diff are both neg. Much improved w/ imodium prn. GI consulted but no acute/emergent procedures will be done inpatient and pt will need to f/u outpatient   Hypokalemia: WNL today   Hypomagnesemia: WNL    HTN: continue on cardura, losartan, HCTZ. Recently not taking meds   DM2: HbA1c 7.0, well controlled. Continue on home anti-DM meds at d/c   Normocytic anemia: H&H are labile. Will transfuse if Hb <7.0.  Thrombocytopenia: resolved   CKDIIIb: Cr is better than baseline   Fibromyalgia and chronic pain: continue on home dose of effexor, gabapentin   Discharge Instructions  Discharge Instructions     Diet - low sodium heart healthy   Complete by: As directed    Diet Carb Modified   Complete by: As  directed    Discharge instructions   Complete by: As directed    F/u w/ neuro surg, Dr. Lacinda Axon, in 1-2 weeks. Will need repeat MRI. F/u w/ neuro, Dr. Melrose Nakayama or Dr. Manuella Ghazi, in 1-2 weeks. F/u w/ PCP in 1-2 weeks. F/u w/ GI, Dr. Vicente Males, in 4-6 weeks.   Increase activity slowly   Complete by: As directed       Allergies as of 11/05/2020       Reactions   Diphenhydramine Hcl Other (See Comments)   hallucinations    Fluoxetine Hcl    REACTION: raised blood pressure   Lisinopril    REACTION: dizzy   Sertraline Hcl    REACTION: dizziness        Medication List     STOP taking these medications    aspirin 81 MG tablet   HYDROcodone-acetaminophen 5-325 MG tablet Commonly known as: Norco       TAKE these medications    Accu-Chek Guide test strip Generic drug: glucose blood Use to check blood sugar once daily Dx Code E11.40   Accu-Chek Softclix Lancets lancets Use to obtain blood sugar sample Dx Code E11.40   COSAMIN DS PO Take by mouth.   doxazosin 4 MG tablet Commonly known as: CARDURA TAKE 1/2  TABLET TWICE DAILY   gabapentin 100 MG capsule Commonly known as: NEURONTIN TAKE 1 CAPSULE TWICE DAILY   glipiZIDE 10 MG 24 hr tablet Commonly known as: GLUCOTROL XL TAKE 1 TABLET TWICE DAILY   hyoscyamine 0.125 MG tablet Commonly known as: LEVSIN Take 1 tablet (0.125 mg total) by mouth 3 (three) times daily as needed. Before meals. Don't use imodium also   Integra Plus Caps Take 1 capsule by mouth every morning.   lidocaine 5 % Commonly known as: Lidoderm Place 1 patch onto the skin every 12 (twelve) hours. Remove & Discard patch within 12 hours or as directed by MD   losartan-hydrochlorothiazide 100-25 MG tablet Commonly known as: HYZAAR TAKE 1 TABLET EVERY DAY   metFORMIN 750 MG 24 hr tablet Commonly known as: GLUCOPHAGE-XR TAKE 2 TABLETS EVERY DAY WITH BREAKFAST   multivitamin tablet Take 1 tablet by mouth daily.   ondansetron 4 MG disintegrating  tablet Commonly known as: Zofran ODT Take 1 tablet (4 mg total) by mouth every 6 (six) hours as needed for nausea or vomiting.   OVER THE COUNTER MEDICATION Knock Out All Natural Sleep Aid   oxyCODONE-acetaminophen 5-325 MG tablet Commonly known as: PERCOCET/ROXICET Take 1 tablet by mouth every 6 (six) hours as needed for up to 1 day for moderate pain or severe pain. What changed: reasons to take this   potassium chloride SA 20 MEQ tablet Commonly known as: KLOR-CON Take 1 tablet (20 mEq total) by mouth 2 (two) times daily for 5 days.   tiZANidine 4 MG tablet Commonly known as: ZANAFLEX TAKE 1 TABLET AT BEDTIME   venlafaxine 37.5 MG tablet Commonly known as: EFFEXOR TAKE 1 TABLET TWICE DAILY   VITAMIN B12 PO Take by mouth.        Allergies  Allergen Reactions   Diphenhydramine Hcl Other (See Comments)    hallucinations    Fluoxetine Hcl     REACTION: raised blood pressure   Lisinopril     REACTION: dizzy   Sertraline Hcl     REACTION: dizziness    Consultations: Neuro surg Neuro GI   Procedures/Studies: CT ANGIO HEAD NECK W WO CM  Result Date: 10/31/2020 CLINICAL DATA:  Stroke, follow up Follow up study. She is not currently a code stroke EXAM: CT ANGIOGRAPHY HEAD AND NECK TECHNIQUE: Multidetector CT imaging of the head and neck was performed using the standard protocol during bolus administration of intravenous contrast. Multiplanar CT image reconstructions and MIPs were obtained to evaluate the vascular anatomy. Carotid stenosis measurements (when applicable) are obtained utilizing NASCET criteria, using the distal internal carotid diameter as the denominator. CONTRAST:  53mL OMNIPAQUE IOHEXOL 350 MG/ML SOLN COMPARISON:  10/29/2020. FINDINGS: CT HEAD FINDINGS Brain: Similar appearance of the suspected evolving hemorrhage in the right external capsule, better characterized on recent MRI. No progressive mass effect. Similar mild associated hyperdensity, likely  representing evolving blood products. No evidence of interval acute large vascular territory infarct or new acute hemorrhage. No hydrocephalus or extra-axial fluid collection. Vascular: See below. Skull: No acute fracture. Sinuses: Clear visualized sinuses. Orbits: Postoperative changes of the right globe. No acute orbital findings. Review of the MIP images confirms the above findings CTA NECK FINDINGS Aortic arch: Atherosclerosis.  Great vessel origins are patent. Right carotid system: Atherosclerosis at the carotid bifurcation without greater than 50% stenosis. Left carotid system: Mild atherosclerosis at the carotid bifurcation without greater than 50% stenosis. Vertebral arteries: Right dominant. No significant (greater than 50%) stenosis. Skeleton: Severe degenerative disease  in the lower cervical spine. Severe multilevel facet arthropathy with degenerative anterolisthesis of C4 on C5. Other neck: No acute abnormality. Small (5 mm) nodule within the right thyroid. No follow-up imaging is recommended. Reference: J Am Coll Radiol. 2015 Feb;12(2): 143-50. Upper chest: Motion limited evaluation without definite consolidation in the visualized lung apices. Review of the MIP images confirms the above findings CTA HEAD FINDINGS Anterior circulation: Bilateral intracranial ICA calcific atherosclerosis with mild narrowing. Bilateral MCAs and ACAs are patent without proximal hemodynamically significant stenosis. No evidence of a vascular malformation in the region of the suspected evolving right external capsule hematoma, although blood products limits evaluation. No aneurysm identified. Posterior circulation: Right dominant intradural vertebral arteries. Mild atherosclerotic narrowing of the proximal right intradural vertebral artery. Basilar artery and bilateral posterior cerebral arteries are patent without significant proximal stenosis. No aneurysm identified. Venous sinuses: As permitted by contrast timing, patent.  Review of the MIP images confirms the above findings IMPRESSION: 1. Similar appearance of the suspected evolving right external capsule hematoma, better characterized on recent MRI. No progressive mass effect or new/interval acute abnormality. 2. No large vessel occlusion or proximal hemodynamically significant stenosis in the head or neck. 3. No evidence of a vascular malformation in the region of the intraparenchymal hematoma, although blood products limits evaluation. Electronically Signed   By: Margaretha Sheffield M.D.   On: 10/31/2020 09:29   CT HEAD WO CONTRAST (5MM)  Result Date: 10/29/2020 CLINICAL DATA:  Fall from wheelchair, hit head, laceration to back of head, neck pain EXAM: CT HEAD WITHOUT CONTRAST CT CERVICAL SPINE WITHOUT CONTRAST TECHNIQUE: Multidetector CT imaging of the head and cervical spine was performed following the standard protocol without intravenous contrast. Multiplanar CT image reconstructions of the cervical spine were also generated. COMPARISON:  08/31/2020 FINDINGS: CT HEAD FINDINGS Brain: No evidence of acute infarction, hemorrhage, hydrocephalus, or extra-axial collection. There is a faintly rim hyperdense masslike lesion in the subinsular deep gray matter of the right hemisphere, in the vicinity of the external capsule and lentiform nuclei, measuring approximately 2.6 x 1.3 x 1.9 cm (series 2, image 14, series 4, image 37). Mild periventricular white matter hypodensity. Vascular: No hyperdense vessel or unexpected calcification. Skull: Normal. Negative for fracture or focal lesion. Sinuses/Orbits: No acute finding. Other: Scalp contusion and laceration of the occiput (series 2, image 13). CT CERVICAL SPINE FINDINGS Alignment: Normal. Skull base and vertebrae: There is a nondisplaced fracture of the lateral left anterior arch of C1, extending into the left lateral mass and articular facets (series 2, image 30). No primary bone lesion or focal pathologic process. Soft tissues and  spinal canal: No prevertebral fluid or swelling. No visible canal hematoma. Disc levels: Moderate disc space height loss and osteophytosis of the lower cervical levels. Upper chest: Negative. Other: None. IMPRESSION: 1. There is a faintly rim hyperdense masslike lesion in the subinsular deep gray matter of the right hemisphere, in the vicinity of the external capsule and lentiform nuclei, new compared to prior examination and highly suspicious for primary brain mass or metastasis. Recommend contrast enhanced MRI to further evaluate. 2. Nondisplaced acute fracture of the lateral left anterior arch of C1, extending into the left lateral mass and articular facets. No other fracture or static subluxation of the cervical spine. 3. Scalp contusion and laceration of the occiput. Electronically Signed   By: Eddie Candle M.D.   On: 10/29/2020 15:57   CT Angio Chest PE W and/or Wo Contrast  Result Date: 10/29/2020 CLINICAL DATA:  Pulmonary embolus suspected with high probability. Recent fall. Chronic back and right hip pain. EXAM: CT ANGIOGRAPHY CHEST CT ABDOMEN AND PELVIS WITH CONTRAST TECHNIQUE: Multidetector CT imaging of the chest was performed using the standard protocol during bolus administration of intravenous contrast. Multiplanar CT image reconstructions and MIPs were obtained to evaluate the vascular anatomy. Multidetector CT imaging of the abdomen and pelvis was performed using the standard protocol during bolus administration of intravenous contrast. CONTRAST:  83mL OMNIPAQUE IOHEXOL 350 MG/ML SOLN COMPARISON:  CT abdomen and pelvis 10/26/2020 FINDINGS: CTA CHEST FINDINGS Cardiovascular: Good opacification of the central and segmental pulmonary arteries. No focal filling defects. No evidence of significant pulmonary embolus. Cardiac enlargement. No pericardial effusions. Mild coronary artery and aortic calcifications. No aortic dissection. Great vessel origins are patent. Mediastinum/Nodes: Esophagus is  decompressed. No significant lymphadenopathy in the chest. Thyroid gland is unremarkable. Lungs/Pleura: Mild dependent changes in the lung bases. No airspace disease or consolidation. No pleural effusions. No pneumothorax. Musculoskeletal: Degenerative changes in the spine and shoulders. No destructive bone lesions identified. Review of the MIP images confirms the above findings. CT ABDOMEN and PELVIS FINDINGS Hepatobiliary: Mild diffuse fatty infiltration of the liver. No focal lesions identified. Surgical absence of the gallbladder. No bile duct dilatation. Pancreas: Unremarkable. No pancreatic ductal dilatation or surrounding inflammatory changes. Spleen: Normal in size without focal abnormality. Adrenals/Urinary Tract: Adrenal glands are unremarkable. Kidneys are normal, without renal calculi, focal lesion, or hydronephrosis. Bladder is unremarkable. Stomach/Bowel: Stomach, small bowel, and colon are not abnormally distended. Prominent submucosal fat in the right colon is likely related to obesity. Diverticulosis of the sigmoid colon. Stranding has improved since prior study without significant residual inflammatory change. Appendix is normal. Vascular/Lymphatic: Aortic atherosclerosis. No enlarged abdominal or pelvic lymph nodes. Reproductive: Status post hysterectomy. No adnexal masses. Other: No free air or free fluid in the abdomen. Postoperative changes in the anterior abdominal wall consistent with mesh hernia repair. No residual or recurrent hernia is identified. Musculoskeletal: Mild lumbar scoliosis convex towards the right. Degenerative changes in the spine and hips. No destructive bone lesions. Review of the MIP images confirms the above findings. IMPRESSION: 1. No evidence of significant pulmonary embolus. 2. No evidence of active pulmonary disease. 3. No acute process demonstrated in the abdomen or pelvis. Electronically Signed   By: Lucienne Capers M.D.   On: 10/29/2020 21:36   CT Cervical Spine  Wo Contrast  Result Date: 10/29/2020 CLINICAL DATA:  Fall from wheelchair, hit head, laceration to back of head, neck pain EXAM: CT HEAD WITHOUT CONTRAST CT CERVICAL SPINE WITHOUT CONTRAST TECHNIQUE: Multidetector CT imaging of the head and cervical spine was performed following the standard protocol without intravenous contrast. Multiplanar CT image reconstructions of the cervical spine were also generated. COMPARISON:  08/31/2020 FINDINGS: CT HEAD FINDINGS Brain: No evidence of acute infarction, hemorrhage, hydrocephalus, or extra-axial collection. There is a faintly rim hyperdense masslike lesion in the subinsular deep gray matter of the right hemisphere, in the vicinity of the external capsule and lentiform nuclei, measuring approximately 2.6 x 1.3 x 1.9 cm (series 2, image 14, series 4, image 37). Mild periventricular white matter hypodensity. Vascular: No hyperdense vessel or unexpected calcification. Skull: Normal. Negative for fracture or focal lesion. Sinuses/Orbits: No acute finding. Other: Scalp contusion and laceration of the occiput (series 2, image 13). CT CERVICAL SPINE FINDINGS Alignment: Normal. Skull base and vertebrae: There is a nondisplaced fracture of the lateral left anterior arch of C1, extending into the left lateral mass and  articular facets (series 2, image 30). No primary bone lesion or focal pathologic process. Soft tissues and spinal canal: No prevertebral fluid or swelling. No visible canal hematoma. Disc levels: Moderate disc space height loss and osteophytosis of the lower cervical levels. Upper chest: Negative. Other: None. IMPRESSION: 1. There is a faintly rim hyperdense masslike lesion in the subinsular deep gray matter of the right hemisphere, in the vicinity of the external capsule and lentiform nuclei, new compared to prior examination and highly suspicious for primary brain mass or metastasis. Recommend contrast enhanced MRI to further evaluate. 2. Nondisplaced acute  fracture of the lateral left anterior arch of C1, extending into the left lateral mass and articular facets. No other fracture or static subluxation of the cervical spine. 3. Scalp contusion and laceration of the occiput. Electronically Signed   By: Eddie Candle M.D.   On: 10/29/2020 15:57   CT PELVIS WO CONTRAST  Result Date: 10/25/2020 CLINICAL DATA:  Pelvic trauma, rule out occult fracture EXAM: CT PELVIS WITHOUT CONTRAST TECHNIQUE: Multidetector CT imaging of the pelvis was performed following the standard protocol without intravenous contrast. COMPARISON:  Radiography from earlier today. FINDINGS: Urinary Tract:  No visible injury Bowel:  No visible injury Vascular/Lymphatic: Scattered atheromatous changes, expected Reproductive:  Hysterectomy.  No adnexal mass Other: No hematoma or soft tissue gas. Lower ventral abdominal wall hernia repair using mesh. Musculoskeletal: Generalized osteopenia. Trochanteric enthesophytes. Enthesophytes of the ischial tuberosities. No evidence of pelvic ring or proximal femur fracture. No diastasis. Lower lumbar disc degeneration with collapse and L5-S1 intervertebral ankylosis. IMPRESSION: Negative for hip or pelvic ring fracture. Electronically Signed   By: Jorje Guild M.D.   On: 10/25/2020 07:25   MR Brain W and Wo Contrast  Result Date: 10/29/2020 CLINICAL DATA:  Initial evaluation for brain mass or lesion, fall. EXAM: MRI HEAD WITHOUT AND WITH CONTRAST MRI CERVICAL SPINE WITHOUT AND WITH CONTRAST TECHNIQUE: Multiplanar, multiecho pulse sequences of the brain and surrounding structures, and cervical spine, to include the craniocervical junction and cervicothoracic junction, were obtained without and with intravenous contrast. CONTRAST:  7.27mL GADAVIST GADOBUTROL 1 MMOL/ML IV SOLN COMPARISON:  Prior CTs from earlier the same day. FINDINGS: MRI HEAD FINDINGS Brain: Cerebral volume within normal limits for age. Patchy T2/FLAIR hyperintensity involving the  periventricular and deep white matter both cerebral hemispheres noted, most consistent with chronic small vessel ischemic disease, mild in nature. Mild patchy involvement of the pons noted as well. There is a well-circumscribed lentiform lesion measuring 2.5 x 1.1 x 1.8 cm positioned at the right lentiform nucleus/external capsule (series 10, image 12). This corresponds with abnormality on prior CT. Lesion demonstrates hyperintense T1 and T2 signal intensity with a surrounding hemosiderin rim. Smooth rind of mild surrounding enhancement noted. Given appearance and position, finding favored to reflect an evolving subacute intraparenchymal hematoma, possibly reflecting an evolving hemorrhagic infarct. No definite underlying mass lesion is seen. Surrounding T2/FLAIR signal abnormality likely reflects associated vasogenic edema. No significant regional mass effect or midline shift. No visible intraventricular extension or other complication. No other evidence for acute or subacute ischemia. Gray-white matter differentiation otherwise maintained. No encephalomalacia to suggest chronic cortical infarction elsewhere within the brain. No other foci of susceptibility artifact to suggest acute or chronic intracranial hemorrhage. No other mass lesion or mass effect. No hydrocephalus or extra-axial fluid collection. Pituitary gland suprasellar region within normal limits. Midline structures intact and normal. No other abnormal enhancement. Vascular: Major intracranial vascular flow voids are maintained. Skull and upper cervical  spine: Craniocervical junction within normal limits. Bone marrow signal intensity normal. No focal marrow replacing lesion. Soft tissue contusion present at the left and central posterior parieto-occipital scalp. No frank soft tissue hematoma. Sinuses/Orbits: Patient status post bilateral ocular lens replacement with scleral buckling on the right. Globes and orbital soft tissues demonstrate no other  acute finding. Scattered mucoperiosteal thickening noted within the ethmoidal air cells and maxillary sinuses. Paranasal sinuses are otherwise clear. Trace effusion noted within the right middle ear cavity. Mastoid air cells are largely clear. Visualized nasopharynx unremarkable. Other: None. MRI CERVICAL SPINE FINDINGS Alignment: Trace grade 1 anterolisthesis of C4 on C5, T1 on T2, T2 on T3, and T3 on T4. Findings chronic and facet mediated. Mild straightening of the normal cervical lordosis elsewhere. Vertebrae: Previously identified acute nondisplaced fracture extending through the left anterior arch and lateral mass of C1 noted, stable in position and alignment from prior CT. No other visible fracture evident by MRI. Vertebral body height maintained. Underlying bone marrow signal intensity within normal limits. No discrete or worrisome osseous lesions. Discogenic reactive endplate change with mild marrow edema noted about the partially visualized T3-4 interspace. Reactive marrow edema about the left C2-3 facet due to facet arthritis (series 8, image 12). No other abnormal marrow edema. Cord: Normal signal and morphology. No evidence for traumatic cord injury. Ligamentous structures appear intact. Posterior Fossa, vertebral arteries, paraspinal tissues: Craniocervical junction remains widely patent and within normal limits. Paraspinous and prevertebral soft tissues demonstrate no acute finding. Normal flow voids seen within the vertebral arteries bilaterally. Chronic fatty atrophy of both parotid glands noted. Disc levels: C2-C3: Minimal disc bulge. Left greater than right facet hypertrophy, with associated reactive marrow edema and small joint effusion on the left. No spinal stenosis. Foramina remain patent. C3-C4: Degenerative intervertebral disc space narrowing with diffuse disc bulge. Left greater than right facet hypertrophy. No spinal stenosis. Mild bilateral C4 foraminal narrowing. C4-C5: Anterolisthesis.  Mild disc bulge with uncovertebral hypertrophy. Bilateral facet degeneration. No significant spinal stenosis. Moderate to severe bilateral C5 foraminal narrowing. C5-C6: Degenerative intervertebral disc space narrowing with diffuse disc osteophyte complex. Posterior component flattens the ventral thecal sac without significant spinal stenosis. Superimposed mild left-sided facet hypertrophy. Moderate to severe left with moderate right C6 foraminal narrowing. C6-C7: Degenerative intervertebral disc space narrowing with diffuse disc osteophyte complex. No significant spinal stenosis. Mild left C7 foraminal narrowing. Right neural foramina remains patent. C7-T1: Degenerative intervertebral disc space narrowing with diffuse disc osteophyte complex. Mild facet and ligament flavum hypertrophy. No significant spinal stenosis. Foramina remain patent. Visualized upper thoracic spine demonstrates mild multilevel noncompressive disc bulging without significant stenosis. IMPRESSION: MRI HEAD IMPRESSION: 1. 2.5 x 1.1 x 1.8 cm well-circumscribed lenticular lesion involving the right lentiform nucleus/external capsule, favored to reflect an evolving subacute intraparenchymal hematoma, possibly related to a subacute hemorrhagic infarct. Mild surrounding vasogenic edema without significant regional mass effect. While no definite underlying mass lesion is identified, a short interval follow-up MRI to ensure these changes resolve and no underlying lesion is present is recommended. 2. No other acute intracranial abnormality. 3. Soft tissue contusion at the left and central posterior parieto-occipital scalp. MRI CERVICAL SPINE IMPRESSION: 1. Acute nondisplaced fracture extending through the left anterior arch and lateral mass of C1, stable in position and alignment from prior CT. 2. No other acute traumatic injury within the cervical spine. No evidence for acute cord injury or ligamentous disruption. 3. Multilevel cervical spondylosis  without significant spinal stenosis. Moderate to severe bilateral C5 and C6  foraminal narrowing as above. Electronically Signed   By: Jeannine Boga M.D.   On: 10/29/2020 21:34   MR Cervical Spine Wo Contrast  Result Date: 10/29/2020 CLINICAL DATA:  Initial evaluation for brain mass or lesion, fall. EXAM: MRI HEAD WITHOUT AND WITH CONTRAST MRI CERVICAL SPINE WITHOUT AND WITH CONTRAST TECHNIQUE: Multiplanar, multiecho pulse sequences of the brain and surrounding structures, and cervical spine, to include the craniocervical junction and cervicothoracic junction, were obtained without and with intravenous contrast. CONTRAST:  7.47mL GADAVIST GADOBUTROL 1 MMOL/ML IV SOLN COMPARISON:  Prior CTs from earlier the same day. FINDINGS: MRI HEAD FINDINGS Brain: Cerebral volume within normal limits for age. Patchy T2/FLAIR hyperintensity involving the periventricular and deep white matter both cerebral hemispheres noted, most consistent with chronic small vessel ischemic disease, mild in nature. Mild patchy involvement of the pons noted as well. There is a well-circumscribed lentiform lesion measuring 2.5 x 1.1 x 1.8 cm positioned at the right lentiform nucleus/external capsule (series 10, image 12). This corresponds with abnormality on prior CT. Lesion demonstrates hyperintense T1 and T2 signal intensity with a surrounding hemosiderin rim. Smooth rind of mild surrounding enhancement noted. Given appearance and position, finding favored to reflect an evolving subacute intraparenchymal hematoma, possibly reflecting an evolving hemorrhagic infarct. No definite underlying mass lesion is seen. Surrounding T2/FLAIR signal abnormality likely reflects associated vasogenic edema. No significant regional mass effect or midline shift. No visible intraventricular extension or other complication. No other evidence for acute or subacute ischemia. Gray-white matter differentiation otherwise maintained. No encephalomalacia to suggest  chronic cortical infarction elsewhere within the brain. No other foci of susceptibility artifact to suggest acute or chronic intracranial hemorrhage. No other mass lesion or mass effect. No hydrocephalus or extra-axial fluid collection. Pituitary gland suprasellar region within normal limits. Midline structures intact and normal. No other abnormal enhancement. Vascular: Major intracranial vascular flow voids are maintained. Skull and upper cervical spine: Craniocervical junction within normal limits. Bone marrow signal intensity normal. No focal marrow replacing lesion. Soft tissue contusion present at the left and central posterior parieto-occipital scalp. No frank soft tissue hematoma. Sinuses/Orbits: Patient status post bilateral ocular lens replacement with scleral buckling on the right. Globes and orbital soft tissues demonstrate no other acute finding. Scattered mucoperiosteal thickening noted within the ethmoidal air cells and maxillary sinuses. Paranasal sinuses are otherwise clear. Trace effusion noted within the right middle ear cavity. Mastoid air cells are largely clear. Visualized nasopharynx unremarkable. Other: None. MRI CERVICAL SPINE FINDINGS Alignment: Trace grade 1 anterolisthesis of C4 on C5, T1 on T2, T2 on T3, and T3 on T4. Findings chronic and facet mediated. Mild straightening of the normal cervical lordosis elsewhere. Vertebrae: Previously identified acute nondisplaced fracture extending through the left anterior arch and lateral mass of C1 noted, stable in position and alignment from prior CT. No other visible fracture evident by MRI. Vertebral body height maintained. Underlying bone marrow signal intensity within normal limits. No discrete or worrisome osseous lesions. Discogenic reactive endplate change with mild marrow edema noted about the partially visualized T3-4 interspace. Reactive marrow edema about the left C2-3 facet due to facet arthritis (series 8, image 12). No other abnormal  marrow edema. Cord: Normal signal and morphology. No evidence for traumatic cord injury. Ligamentous structures appear intact. Posterior Fossa, vertebral arteries, paraspinal tissues: Craniocervical junction remains widely patent and within normal limits. Paraspinous and prevertebral soft tissues demonstrate no acute finding. Normal flow voids seen within the vertebral arteries bilaterally. Chronic fatty atrophy of both parotid glands  noted. Disc levels: C2-C3: Minimal disc bulge. Left greater than right facet hypertrophy, with associated reactive marrow edema and small joint effusion on the left. No spinal stenosis. Foramina remain patent. C3-C4: Degenerative intervertebral disc space narrowing with diffuse disc bulge. Left greater than right facet hypertrophy. No spinal stenosis. Mild bilateral C4 foraminal narrowing. C4-C5: Anterolisthesis. Mild disc bulge with uncovertebral hypertrophy. Bilateral facet degeneration. No significant spinal stenosis. Moderate to severe bilateral C5 foraminal narrowing. C5-C6: Degenerative intervertebral disc space narrowing with diffuse disc osteophyte complex. Posterior component flattens the ventral thecal sac without significant spinal stenosis. Superimposed mild left-sided facet hypertrophy. Moderate to severe left with moderate right C6 foraminal narrowing. C6-C7: Degenerative intervertebral disc space narrowing with diffuse disc osteophyte complex. No significant spinal stenosis. Mild left C7 foraminal narrowing. Right neural foramina remains patent. C7-T1: Degenerative intervertebral disc space narrowing with diffuse disc osteophyte complex. Mild facet and ligament flavum hypertrophy. No significant spinal stenosis. Foramina remain patent. Visualized upper thoracic spine demonstrates mild multilevel noncompressive disc bulging without significant stenosis. IMPRESSION: MRI HEAD IMPRESSION: 1. 2.5 x 1.1 x 1.8 cm well-circumscribed lenticular lesion involving the right  lentiform nucleus/external capsule, favored to reflect an evolving subacute intraparenchymal hematoma, possibly related to a subacute hemorrhagic infarct. Mild surrounding vasogenic edema without significant regional mass effect. While no definite underlying mass lesion is identified, a short interval follow-up MRI to ensure these changes resolve and no underlying lesion is present is recommended. 2. No other acute intracranial abnormality. 3. Soft tissue contusion at the left and central posterior parieto-occipital scalp. MRI CERVICAL SPINE IMPRESSION: 1. Acute nondisplaced fracture extending through the left anterior arch and lateral mass of C1, stable in position and alignment from prior CT. 2. No other acute traumatic injury within the cervical spine. No evidence for acute cord injury or ligamentous disruption. 3. Multilevel cervical spondylosis without significant spinal stenosis. Moderate to severe bilateral C5 and C6 foraminal narrowing as above. Electronically Signed   By: Jeannine Boga M.D.   On: 10/29/2020 21:34   MR LUMBAR SPINE WO CONTRAST  Result Date: 10/27/2020 CLINICAL DATA:  Low back pain EXAM: MRI LUMBAR SPINE WITHOUT CONTRAST TECHNIQUE: Multiplanar, multisequence MR imaging of the lumbar spine was performed. No intravenous contrast was administered. COMPARISON:  None. FINDINGS: Segmentation:  Standard. Alignment:  Levoscoliosis with apex at L2-3 Vertebrae: Moderate edema within the lower half of the L1 vertebral body. Conus medullaris and cauda equina: Conus extends to the L1 level. Conus and cauda equina appear normal. Paraspinal and other soft tissues: Negative. Disc levels: T12-L1: Small central disc protrusion. No spinal canal or neural foraminal stenosis L1-L2: Left asymmetric disc bulge. No spinal canal stenosis. Mild left neural foraminal stenosis. L2-L3: Right asymmetric disc bulge with endplate spurring. No spinal canal stenosis. Mild right neural foraminal stenosis. L3-L4:  Right asymmetric disc bulge with facet hypertrophy. No spinal canal stenosis. Severe right and mild left neural foraminal stenosis. L4-L5: Disc space narrowing with left asymmetric disc bulge. Facet hypertrophy. No spinal canal stenosis. Moderate left neural foraminal stenosis. L5-S1: Normal disc space and facet joints. No spinal canal stenosis. No neural foraminal stenosis. Visualized sacrum: Normal. IMPRESSION: 1. Levoscoliosis with apex at L2-3. 2. Degenerative, moderate edema within the lower half of the L1 vertebral body may be a source of local low back pain. 3. Severe right L3-4 neural foraminal stenosis. 4. Moderate left L4-5 neural foraminal stenosis. 5. No spinal canal stenosis. Electronically Signed   By: Ulyses Jarred M.D.   On: 10/27/2020 00:23  CT ABDOMEN PELVIS W CONTRAST  Result Date: 10/29/2020 CLINICAL DATA:  Pulmonary embolus suspected with high probability. Recent fall. Chronic back and right hip pain. EXAM: CT ANGIOGRAPHY CHEST CT ABDOMEN AND PELVIS WITH CONTRAST TECHNIQUE: Multidetector CT imaging of the chest was performed using the standard protocol during bolus administration of intravenous contrast. Multiplanar CT image reconstructions and MIPs were obtained to evaluate the vascular anatomy. Multidetector CT imaging of the abdomen and pelvis was performed using the standard protocol during bolus administration of intravenous contrast. CONTRAST:  57mL OMNIPAQUE IOHEXOL 350 MG/ML SOLN COMPARISON:  CT abdomen and pelvis 10/26/2020 FINDINGS: CTA CHEST FINDINGS Cardiovascular: Good opacification of the central and segmental pulmonary arteries. No focal filling defects. No evidence of significant pulmonary embolus. Cardiac enlargement. No pericardial effusions. Mild coronary artery and aortic calcifications. No aortic dissection. Great vessel origins are patent. Mediastinum/Nodes: Esophagus is decompressed. No significant lymphadenopathy in the chest. Thyroid gland is unremarkable.  Lungs/Pleura: Mild dependent changes in the lung bases. No airspace disease or consolidation. No pleural effusions. No pneumothorax. Musculoskeletal: Degenerative changes in the spine and shoulders. No destructive bone lesions identified. Review of the MIP images confirms the above findings. CT ABDOMEN and PELVIS FINDINGS Hepatobiliary: Mild diffuse fatty infiltration of the liver. No focal lesions identified. Surgical absence of the gallbladder. No bile duct dilatation. Pancreas: Unremarkable. No pancreatic ductal dilatation or surrounding inflammatory changes. Spleen: Normal in size without focal abnormality. Adrenals/Urinary Tract: Adrenal glands are unremarkable. Kidneys are normal, without renal calculi, focal lesion, or hydronephrosis. Bladder is unremarkable. Stomach/Bowel: Stomach, small bowel, and colon are not abnormally distended. Prominent submucosal fat in the right colon is likely related to obesity. Diverticulosis of the sigmoid colon. Stranding has improved since prior study without significant residual inflammatory change. Appendix is normal. Vascular/Lymphatic: Aortic atherosclerosis. No enlarged abdominal or pelvic lymph nodes. Reproductive: Status post hysterectomy. No adnexal masses. Other: No free air or free fluid in the abdomen. Postoperative changes in the anterior abdominal wall consistent with mesh hernia repair. No residual or recurrent hernia is identified. Musculoskeletal: Mild lumbar scoliosis convex towards the right. Degenerative changes in the spine and hips. No destructive bone lesions. Review of the MIP images confirms the above findings. IMPRESSION: 1. No evidence of significant pulmonary embolus. 2. No evidence of active pulmonary disease. 3. No acute process demonstrated in the abdomen or pelvis. Electronically Signed   By: Lucienne Capers M.D.   On: 10/29/2020 21:36   CT ABDOMEN PELVIS W CONTRAST  Result Date: 10/26/2020 CLINICAL DATA:  Diverticulitis suspected. Weakness,  diarrhea, and vomiting for 2 months. EXAM: CT ABDOMEN AND PELVIS WITH CONTRAST TECHNIQUE: Multidetector CT imaging of the abdomen and pelvis was performed using the standard protocol following bolus administration of intravenous contrast. CONTRAST:  57mL OMNIPAQUE IOHEXOL 350 MG/ML SOLN COMPARISON:  CT pelvis 10/25/2020 FINDINGS: Lower chest: Motion artifact limits examination. No focal consolidation. Hepatobiliary: Diffuse fatty infiltration of the liver. No focal lesions. Gallbladder is surgically absent. No bile duct dilatation. Pancreas: Unremarkable. No pancreatic ductal dilatation or surrounding inflammatory changes. Spleen: Normal in size without focal abnormality. Adrenals/Urinary Tract: No adrenal gland nodules. Renal nephrograms are symmetrical. Bilateral renal parenchymal atrophy. Bilateral intrarenal stones. Largest on the left measures 6 mm diameter. No hydronephrosis or hydroureter. Bladder is decompressed. Stomach/Bowel: Stomach, small bowel, and colon are not abnormally distended. Sigmoid colonic diverticula. Small focal area of stranding at the junction of the descending and sigmoid colon may represent early diverticulitis. No abscess. Prominent submucosal fat in the right colon is  probably related to obesity. Appendix is not identified. Vascular/Lymphatic: Aortic calcification. No aneurysm. Retroperitoneal and mesenteric lymph nodes are present without pathologic enlargement, likely reactive. Reproductive: Uterus is surgically absent. No abnormal pelvic masses. Other: No free air or free fluid in the abdomen. Previous mesh repair of a ventral abdominal hernia. No significant residual or recurrent herniation. Musculoskeletal: Degenerative changes in the spine. No destructive bone lesions. IMPRESSION: 1. Sigmoid colonic diverticulosis with minimal pericolonic stranding suggesting possible early diverticulitis. No abscess. 2. Diffuse fatty infiltration of the liver. 3. Nonobstructing renal stones  bilaterally. Diffuse renal parenchymal atrophy. 4. Aortic atherosclerosis. Electronically Signed   By: Lucienne Capers M.D.   On: 10/26/2020 19:28   CT T-SPINE NO CHARGE  Result Date: 10/29/2020 CLINICAL DATA:  Patient fell, striking head. Chronic back pain and right hip pain. EXAM: CT Thoracic and Lumbar spine with contrast TECHNIQUE: Multiplanar CT images of the thoracic and lumbar spine were reconstructed from contemporary CT of the Chest, Abdomen, and Pelvis CONTRAST:  No additional COMPARISON:  CT abdomen and pelvis and MRI lumbar spine 10/26/2020 FINDINGS: CT THORACIC SPINE FINDINGS Alignment: Mild thoracic scoliosis, convex towards the right. No anterior subluxations. Vertebrae: No vertebral compression deformities. No focal bone lesion or bone destruction. Bone cortex appears intact. Paraspinal and other soft tissues: No paraspinal soft tissue infiltration or mass. Disc levels: Degenerative changes throughout the thoracic spine with narrowed interspaces and endplate osteophyte formation. Bridging anterior osteophytes throughout. No evidence of severe bony encroachment upon the central canal. CT LUMBAR SPINE FINDINGS Segmentation: 5 lumbar type vertebrae. Alignment: Mild lumbar scoliosis convex towards the left. No anterior subluxations. Vertebrae: Schmorl's node at the inferior endplate of L2, unchanged. No acute vertebral compression deformities. No focal bone lesions or bone destruction. Bone cortex appears intact. Paraspinal and other soft tissues: Paraspinal soft tissues appear intact. No evidence of paraspinal mass or soft tissue infiltration. Disc levels: Degenerative changes throughout the lumbar spine with narrowed interspaces and endplate osteophyte formation. Degenerative changes in the facet joints. Moderate central canal stenosis is suggested at L3-4 and L4-5 levels, caused by combination of osteophyte formation and disc bulging. IMPRESSION: 1. Mild thoracolumbar scoliosis with thoracic  spine convex towards the right and lumbar spine towards the left. 2. No anterior subluxations. 3. No acute fractures or dislocation identified. 4. Diffuse degenerative changes. Electronically Signed   By: Lucienne Capers M.D.   On: 10/29/2020 21:45   CT L-SPINE NO CHARGE  Result Date: 10/29/2020 CLINICAL DATA:  Patient fell, striking head. Chronic back pain and right hip pain. EXAM: CT Thoracic and Lumbar spine with contrast TECHNIQUE: Multiplanar CT images of the thoracic and lumbar spine were reconstructed from contemporary CT of the Chest, Abdomen, and Pelvis CONTRAST:  No additional COMPARISON:  CT abdomen and pelvis and MRI lumbar spine 10/26/2020 FINDINGS: CT THORACIC SPINE FINDINGS Alignment: Mild thoracic scoliosis, convex towards the right. No anterior subluxations. Vertebrae: No vertebral compression deformities. No focal bone lesion or bone destruction. Bone cortex appears intact. Paraspinal and other soft tissues: No paraspinal soft tissue infiltration or mass. Disc levels: Degenerative changes throughout the thoracic spine with narrowed interspaces and endplate osteophyte formation. Bridging anterior osteophytes throughout. No evidence of severe bony encroachment upon the central canal. CT LUMBAR SPINE FINDINGS Segmentation: 5 lumbar type vertebrae. Alignment: Mild lumbar scoliosis convex towards the left. No anterior subluxations. Vertebrae: Schmorl's node at the inferior endplate of L2, unchanged. No acute vertebral compression deformities. No focal bone lesions or bone destruction. Bone cortex appears intact. Paraspinal  and other soft tissues: Paraspinal soft tissues appear intact. No evidence of paraspinal mass or soft tissue infiltration. Disc levels: Degenerative changes throughout the lumbar spine with narrowed interspaces and endplate osteophyte formation. Degenerative changes in the facet joints. Moderate central canal stenosis is suggested at L3-4 and L4-5 levels, caused by combination of  osteophyte formation and disc bulging. IMPRESSION: 1. Mild thoracolumbar scoliosis with thoracic spine convex towards the right and lumbar spine towards the left. 2. No anterior subluxations. 3. No acute fractures or dislocation identified. 4. Diffuse degenerative changes. Electronically Signed   By: Lucienne Capers M.D.   On: 10/29/2020 21:45   DG Chest Portable 1 View  Result Date: 10/26/2020 CLINICAL DATA:  Weakness evaluate for edema or infiltrate EXAM: PORTABLE CHEST 1 VIEW COMPARISON:  08/29/2020 FINDINGS: Cardiomegaly. Both lungs are clear. The visualized skeletal structures are unremarkable. IMPRESSION: Cardiomegaly without acute abnormality of the lungs in AP portable projection. Electronically Signed   By: Eddie Candle M.D.   On: 10/26/2020 18:43   ECHOCARDIOGRAM COMPLETE  Result Date: 10/31/2020    ECHOCARDIOGRAM REPORT   Patient Name:   KASHIA BROSSARD Date of Exam: 10/30/2020 Medical Rec #:  [id number]       Height: Accession #:    [id number]      Weight: Date of Birth:  04-12-1940       BSA: Patient Age:    39 years        BP:           98/60 mmHg Patient Gender: F               HR:           69 bpm. Exam Location:  [facility] Procedure: 2D Echo, Cardiac Doppler and Color Doppler Indications:     Stroke I63.9  History:         Patient has no prior history of Echocardiogram examinations.                  Risk Factors:Hypertension and Diabetes.  Sonographer:     Sherrie Sport Referring Phys:  [id number] Anda Latina Diagnosing Phys: Ida Rogue MD IMPRESSIONS  1. Left ventricular ejection fraction, by estimation, is 55 to 60%. The left ventricle has normal function. The left ventricle has no regional wall motion abnormalities. There is mild left ventricular hypertrophy. Left ventricular diastolic parameters are consistent with Grade I diastolic dysfunction (impaired relaxation).  2. Right ventricular systolic function is normal. The right ventricular size is normal. There is normal pulmonary artery systolic  pressure. The estimated right ventricular systolic pressure is 86.7 mmHg.  3. The mitral valve is normal in structure. No evidence of mitral valve regurgitation. No evidence of mitral stenosis. FINDINGS  Left Ventricle: Left ventricular ejection fraction, by estimation, is 55 to 60%. The left ventricle has normal function. The left ventricle has no regional wall motion abnormalities. The left ventricular internal cavity size was normal in size. There is  mild left ventricular hypertrophy. Left ventricular diastolic parameters are consistent with Grade I diastolic dysfunction (impaired relaxation). Right Ventricle: The right ventricular size is normal. No increase in right ventricular wall thickness. Right ventricular systolic function is normal. There is normal pulmonary artery systolic pressure. The tricuspid regurgitant velocity is 1.89 m/s, and  with an assumed right atrial pressure of 5 mmHg, the estimated right ventricular systolic pressure is 61.9 mmHg. Left Atrium: Left atrial size was normal in size. Right Atrium: Right atrial size was normal in size. Pericardium:  There is no evidence of pericardial effusion. Mitral Valve: The mitral valve is normal in structure. No evidence of mitral valve regurgitation. No evidence of mitral valve stenosis. Tricuspid Valve: The tricuspid valve is normal in structure. Tricuspid valve regurgitation is not demonstrated. No evidence of tricuspid stenosis. Aortic Valve: The aortic valve was not well visualized. Aortic valve regurgitation is not visualized. No aortic stenosis is present. Aortic valve mean gradient measures 3.0 mmHg. Aortic valve peak gradient measures 5.9 mmHg. Aortic valve area, by VTI measures 2.61 cm. Pulmonic Valve: The pulmonic valve was normal in structure. Pulmonic valve regurgitation is not visualized. No evidence of pulmonic stenosis. Aorta: The aortic root is normal in size and structure. Venous: The inferior vena cava is normal in size with greater  than 50% respiratory variability, suggesting right atrial pressure of 3 mmHg. IAS/Shunts: No atrial level shunt detected by color flow Doppler.  LEFT VENTRICLE PLAX 2D LVIDd:         3.90 cm  Diastology LVIDs:         2.74 cm  LV e' medial:    3.81 cm/s LV PW:         1.10 cm  LV E/e' medial:  11.9 LV IVS:        1.11 cm  LV e' lateral:   5.55 cm/s LVOT diam:     2.00 cm  LV E/e' lateral: 8.2 LV SV:         57 LVOT Area:     3.14 cm  RIGHT VENTRICLE RV Basal diam:  2.40 cm RV S prime:     13.60 cm/s TAPSE (M-mode): 3.6 cm LEFT ATRIUM             RIGHT ATRIUM LA diam:        3.40 cm RA Area:     9.99 cm LA Vol (A2C):   63.1 ml RA Volume:   17.40 ml LA Vol (A4C):   44.7 ml LA Biplane Vol: 54.8 ml  AORTIC VALVE                   PULMONIC VALVE AV Area (Vmax):    2.20 cm    PV Vmax:        0.80 m/s AV Area (Vmean):   2.35 cm    PV Peak grad:   2.6 mmHg AV Area (VTI):     2.61 cm    RVOT Peak grad: 2 mmHg AV Vmax:           121.00 cm/s AV Vmean:          79.000 cm/s AV VTI:            0.218 m AV Peak Grad:      5.9 mmHg AV Mean Grad:      3.0 mmHg LVOT Vmax:         84.90 cm/s LVOT Vmean:        59.000 cm/s LVOT VTI:          0.181 m LVOT/AV VTI ratio: 0.83  AORTA Ao Root diam: 3.27 cm MITRAL VALVE               TRICUSPID VALVE MV Area (PHT): 4.41 cm    TR Peak grad:   14.3 mmHg MV Decel Time: 172 msec    TR Vmax:        189.00 cm/s MV E velocity: 45.40 cm/s  SHUNTS                            Systemic VTI:  0.18 m                            Systemic Diam: 2.00 cm Ida Rogue MD Electronically signed by Ida Rogue MD Signature Date/Time: 10/31/2020/3:26:30 PM    Final    DG HIP UNILAT WITH PELVIS 2-3 VIEWS RIGHT  Result Date: 10/25/2020 CLINICAL DATA:  fall EXAM: DG HIP (WITH OR WITHOUT PELVIS) 2-3V RIGHT COMPARISON:  None. FINDINGS: There is no evidence of hip fracture or dislocation. There is no evidence of arthropathy or other focal bone abnormality. IMPRESSION: Negative.  Electronically Signed   By: Ulyses Jarred M.D.   On: 10/25/2020 01:31   (Echo, Carotid, EGD, Colonoscopy, ERCP)    Subjective: Pt c/o intermittent neck pain    Discharge Exam: Vitals:   11/05/20 0533 11/05/20 0943  BP: (!) 141/79 (!) 154/73  Pulse: 80 73  Resp: 16 19  Temp: 98.8 F (37.1 C)   SpO2: 96% 96%   Vitals:   11/04/20 2042 11/04/20 2349 11/05/20 0533 11/05/20 0943  BP: (!) 168/80 (!) 141/73 (!) 141/79 (!) 154/73  Pulse: 65 76 80 73  Resp: 19 17 16 19   Temp: 97.6 F (36.4 C)  98.8 F (37.1 C)   TempSrc:      SpO2: 97% 96% 96% 96%  Weight:   82.3 kg   Height:        General: Pt is alert, awake, not in acute distress. Frail appearing  Cardiovascular: S1/S2 +, no rubs, no gallops Respiratory: CTA bilaterally, no wheezing, no rhonchi Abdominal: Soft, NT, ND, bowel sounds + Extremities:  no cyanosis    The results of significant diagnostics from this hospitalization (including imaging, microbiology, ancillary and laboratory) are listed below for reference.     Microbiology: Recent Results (from the past 240 hour(s))  C Difficile Quick Screen w PCR reflex     Status: None   Collection Time: 10/29/20 10:14 PM   Specimen: Stool  Result Value Ref Range Status   C Diff antigen NEGATIVE NEGATIVE Final   C Diff toxin NEGATIVE NEGATIVE Final   C Diff interpretation No C. difficile detected.  Final    Comment: Performed at [facility], [address] [place], [place] [address]  Resp Panel by RT-PCR (Flu A&B, Covid) Nasopharyngeal Swab     Status: None   Collection Time: 10/29/20 11:31 PM   Specimen: Nasopharyngeal Swab; Nasopharyngeal(NP) swabs in vial transport medium  Result Value Ref Range Status   SARS Coronavirus 2 by RT PCR NEGATIVE NEGATIVE Final    Comment: (NOTE) SARS-CoV-2 target nucleic acids are NOT DETECTED.  The SARS-CoV-2 RNA is generally detectable in upper respiratory specimens during the acute phase of infection. The  lowest concentration of SARS-CoV-2 viral copies this assay can detect is 138 copies/mL. A negative result does not preclude SARS-Cov-2 infection and should not be used as the sole basis for treatment or other patient management decisions. A negative result may occur with  improper specimen collection/handling, submission of specimen other than nasopharyngeal swab, presence of viral mutation(s) within the areas targeted by this assay, and inadequate number of viral copies(<138 copies/mL). A negative result must be combined with clinical observations, patient history, and epidemiological information. The expected result is Negative.  Fact Sheet for  Patients:  EntrepreneurPulse.com.au  Fact Sheet for Healthcare Providers:  IncredibleEmployment.be  This test is no t yet approved or cleared by the [place] FDA and  has been authorized for detection and/or diagnosis of SARS-CoV-2 by FDA under an Emergency Use Authorization (EUA). This EUA will remain  in effect (meaning this test can be used) for the duration of the COVID-19 declaration under Section 564(b)(1) of the Act, 21 U.S.C.section 360bbb-3(b)(1), unless the authorization is terminated  or revoked sooner.       Influenza A by PCR NEGATIVE NEGATIVE Final   Influenza B by PCR NEGATIVE NEGATIVE Final    Comment: (NOTE) The Xpert Xpress SARS-CoV-2/FLU/RSV plus assay is intended as an aid in the diagnosis of influenza from Nasopharyngeal swab specimens and should not be used as a sole basis for treatment. Nasal washings and aspirates are unacceptable for Xpert Xpress SARS-CoV-2/FLU/RSV testing.  Fact Sheet for Patients: EntrepreneurPulse.com.au  Fact Sheet for Healthcare Providers: IncredibleEmployment.be  This test is not yet approved or cleared by the [place] FDA and has been authorized for detection and/or diagnosis of SARS-CoV-2 by FDA under  an Emergency Use Authorization (EUA). This EUA will remain in effect (meaning this test can be used) for the duration of the COVID-19 declaration under Section 564(b)(1) of the Act, 21 U.S.C. section 360bbb-3(b)(1), unless the authorization is terminated or revoked.  Performed at [facility], [address] [place], [place] [address]   Gastrointestinal Panel by PCR , Stool     Status: None   Collection Time: 10/30/20 11:55 AM   Specimen: Stool  Result Value Ref Range Status   Campylobacter species NOT DETECTED NOT DETECTED Final   Plesimonas shigelloides NOT DETECTED NOT DETECTED Final   Salmonella species NOT DETECTED NOT DETECTED Final   Yersinia enterocolitica NOT DETECTED NOT DETECTED Final   Vibrio species NOT DETECTED NOT DETECTED Final   Vibrio cholerae NOT DETECTED NOT DETECTED Final   Enteroaggregative E coli (EAEC) NOT DETECTED NOT DETECTED Final   Enteropathogenic E coli (EPEC) NOT DETECTED NOT DETECTED Final   Enterotoxigenic E coli (ETEC) NOT DETECTED NOT DETECTED Final   Shiga like toxin producing E coli (STEC) NOT DETECTED NOT DETECTED Final   Shigella/Enteroinvasive E coli (EIEC) NOT DETECTED NOT DETECTED Final   Cryptosporidium NOT DETECTED NOT DETECTED Final   Cyclospora cayetanensis NOT DETECTED NOT DETECTED Final   Entamoeba histolytica NOT DETECTED NOT DETECTED Final   Giardia lamblia NOT DETECTED NOT DETECTED Final   Adenovirus F40/41 NOT DETECTED NOT DETECTED Final   Astrovirus NOT DETECTED NOT DETECTED Final   Norovirus GI/GII NOT DETECTED NOT DETECTED Final   Rotavirus A NOT DETECTED NOT DETECTED Final   Sapovirus (I, II, IV, and V) NOT DETECTED NOT DETECTED Final    Comment: Performed at [facility], [address] [place], [place] [address]  MRSA Next Gen by PCR, Nasal     Status: None   Collection Time: 11/01/20  7:01 PM   Specimen: Nasal Mucosa; Nasal Swab  Result Value Ref Range Status   MRSA by PCR Next Gen NOT DETECTED NOT  DETECTED Final    Comment: (NOTE) The GeneXpert MRSA Assay (FDA approved for NASAL specimens only), is one component of a comprehensive MRSA colonization surveillance program. It is not intended to diagnose MRSA infection nor to guide or monitor treatment for MRSA infections. Test performance is not FDA approved in patients less than 37 years old. Performed at [facility], [address],  [place], [place] [address]      Labs: BNP (last 3 results) No results for input(s): BNP in the last 8760 hours. Basic Metabolic Panel: Recent Labs  Lab 10/31/20 0703 11/01/20 0753 11/02/20 0401 11/03/20 0518 11/04/20 0429 11/05/20 0806  NA 135 135 137 135 136 136  K 3.5 3.5 3.9 3.4* 3.2* 4.0  CL 99 100 103 103 103 101  CO2 20* 23 26 25 25 27   GLUCOSE 206* 168* 143* 121* 139* 162*  BUN 19 22 23 16 13 12   CREATININE 1.14* 1.40* 1.35* 1.02* 0.80 0.85  CALCIUM 8.5* 8.5* 8.1* 8.0* 8.0* 8.3*  MG 1.9 1.8 1.7 1.5* 1.8  --    Liver Function Tests: No results for input(s): AST, ALT, ALKPHOS, BILITOT, PROT, ALBUMIN in the last 168 hours. No results for input(s): LIPASE, AMYLASE in the last 168 hours. No results for input(s): AMMONIA in the last 168 hours. CBC: Recent Labs  Lab 10/29/20 1947 10/30/20 0503 11/01/20 0753 11/02/20 0401 11/03/20 0518 11/04/20 0429 11/05/20 0806  WBC 8.4   < > 4.7 5.2 4.0 5.0 6.0  NEUTROABS 6.5  --   --   --   --   --   --   HGB 11.9*   < > 10.2* 9.5* 9.5* 9.3* 9.8*  HCT 35.0*   < > 30.0* 28.1* 28.2* 27.1* 29.3*  MCV 94.3   < > 94.6 96.2 93.7 92.2 93.0  PLT 234   < > 173 162 142* 142* 155   < > = values in this interval not displayed.   Cardiac Enzymes: No results for input(s): CKTOTAL, CKMB, CKMBINDEX, TROPONINI in the last 168 hours. BNP: Invalid input(s): POCBNP CBG: Recent Labs  Lab 11/04/20 0819 11/04/20 1201 11/04/20 1529 11/04/20 2044 11/05/20 0940  GLUCAP 169* 191* 268* 193* 161*   D-Dimer No results for input(s): DDIMER in  the last 72 hours. Hgb A1c No results for input(s): HGBA1C in the last 72 hours. Lipid Profile No results for input(s): CHOL, HDL, LDLCALC, TRIG, CHOLHDL, LDLDIRECT in the last 72 hours. Thyroid function studies No results for input(s): TSH, T4TOTAL, T3FREE, THYROIDAB in the last 72 hours.  Invalid input(s): FREET3 Anemia work up No results for input(s): VITAMINB12, FOLATE, FERRITIN, TIBC, IRON, RETICCTPCT in the last 72 hours. Urinalysis    Component Value Date/Time   COLORURINE YELLOW (A) 10/25/2020 0246   APPEARANCEUR HAZY (A) 10/25/2020 0246   LABSPEC 1.012 10/25/2020 0246   PHURINE 5.0 10/25/2020 0246   GLUCOSEU 50 (A) 10/25/2020 0246   HGBUR NEGATIVE 10/25/2020 0246   BILIRUBINUR NEGATIVE 10/25/2020 0246   KETONESUR 5 (A) 10/25/2020 0246   PROTEINUR 100 (A) 10/25/2020 0246   UROBILINOGEN 1.0 10/25/2006 0844   NITRITE NEGATIVE 10/25/2020 0246   LEUKOCYTESUR NEGATIVE 10/25/2020 0246   Sepsis Labs Invalid input(s): PROCALCITONIN,  WBC,  LACTICIDVEN Microbiology Recent Results (from the past 240 hour(s))  C Difficile Quick Screen w PCR reflex     Status: None   Collection Time: 10/29/20 10:14 PM   Specimen: Stool  Result Value Ref Range Status   C Diff antigen NEGATIVE NEGATIVE Final   C Diff toxin NEGATIVE NEGATIVE Final   C Diff interpretation No C. difficile detected.  Final    Comment: Performed at [facility], [address] [place], [place] [address]  Resp Panel by RT-PCR (Flu A&B, Covid) Nasopharyngeal Swab     Status: None   Collection Time: 10/29/20 11:31 PM   Specimen: Nasopharyngeal Swab; Nasopharyngeal(NP) swabs in  vial transport medium  Result Value Ref Range Status   SARS Coronavirus 2 by RT PCR NEGATIVE NEGATIVE Final    Comment: (NOTE) SARS-CoV-2 target nucleic acids are NOT DETECTED.  The SARS-CoV-2 RNA is generally detectable in upper respiratory specimens during the acute phase of infection. The lowest concentration of SARS-CoV-2 viral  copies this assay can detect is 138 copies/mL. A negative result does not preclude SARS-Cov-2 infection and should not be used as the sole basis for treatment or other patient management decisions. A negative result may occur with  improper specimen collection/handling, submission of specimen other than nasopharyngeal swab, presence of viral mutation(s) within the areas targeted by this assay, and inadequate number of viral copies(<138 copies/mL). A negative result must be combined with clinical observations, patient history, and epidemiological information. The expected result is Negative.  Fact Sheet for Patients:  EntrepreneurPulse.com.au  Fact Sheet for Healthcare Providers:  IncredibleEmployment.be  This test is no t yet approved or cleared by the [place] FDA and  has been authorized for detection and/or diagnosis of SARS-CoV-2 by FDA under an Emergency Use Authorization (EUA). This EUA will remain  in effect (meaning this test can be used) for the duration of the COVID-19 declaration under Section 564(b)(1) of the Act, 21 U.S.C.section 360bbb-3(b)(1), unless the authorization is terminated  or revoked sooner.       Influenza A by PCR NEGATIVE NEGATIVE Final   Influenza B by PCR NEGATIVE NEGATIVE Final    Comment: (NOTE) The Xpert Xpress SARS-CoV-2/FLU/RSV plus assay is intended as an aid in the diagnosis of influenza from Nasopharyngeal swab specimens and should not be used as a sole basis for treatment. Nasal washings and aspirates are unacceptable for Xpert Xpress SARS-CoV-2/FLU/RSV testing.  Fact Sheet for Patients: EntrepreneurPulse.com.au  Fact Sheet for Healthcare Providers: IncredibleEmployment.be  This test is not yet approved or cleared by the [place] FDA and has been authorized for detection and/or diagnosis of SARS-CoV-2 by FDA under an Emergency Use Authorization (EUA). This  EUA will remain in effect (meaning this test can be used) for the duration of the COVID-19 declaration under Section 564(b)(1) of the Act, 21 U.S.C. section 360bbb-3(b)(1), unless the authorization is terminated or revoked.  Performed at [facility], [address] [place], [place] [address]   Gastrointestinal Panel by PCR , Stool     Status: None   Collection Time: 10/30/20 11:55 AM   Specimen: Stool  Result Value Ref Range Status   Campylobacter species NOT DETECTED NOT DETECTED Final   Plesimonas shigelloides NOT DETECTED NOT DETECTED Final   Salmonella species NOT DETECTED NOT DETECTED Final   Yersinia enterocolitica NOT DETECTED NOT DETECTED Final   Vibrio species NOT DETECTED NOT DETECTED Final   Vibrio cholerae NOT DETECTED NOT DETECTED Final   Enteroaggregative E coli (EAEC) NOT DETECTED NOT DETECTED Final   Enteropathogenic E coli (EPEC) NOT DETECTED NOT DETECTED Final   Enterotoxigenic E coli (ETEC) NOT DETECTED NOT DETECTED Final   Shiga like toxin producing E coli (STEC) NOT DETECTED NOT DETECTED Final   Shigella/Enteroinvasive E coli (EIEC) NOT DETECTED NOT DETECTED Final   Cryptosporidium NOT DETECTED NOT DETECTED Final   Cyclospora cayetanensis NOT DETECTED NOT DETECTED Final   Entamoeba histolytica NOT DETECTED NOT DETECTED Final   Giardia lamblia NOT DETECTED NOT DETECTED Final   Adenovirus F40/41 NOT DETECTED NOT DETECTED Final   Astrovirus NOT DETECTED NOT DETECTED Final   Norovirus GI/GII NOT DETECTED NOT DETECTED Final   Rotavirus  A NOT DETECTED NOT DETECTED Final   Sapovirus (I, II, IV, and V) NOT DETECTED NOT DETECTED Final    Comment: Performed at [facility], [address] [place], [place] [address]  MRSA Next Gen by PCR, Nasal     Status: None   Collection Time: 11/01/20  7:01 PM   Specimen: Nasal Mucosa; Nasal Swab  Result Value Ref Range Status   MRSA by PCR Next Gen NOT DETECTED NOT DETECTED Final    Comment: (NOTE) The  GeneXpert MRSA Assay (FDA approved for NASAL specimens only), is one component of a comprehensive MRSA colonization surveillance program. It is not intended to diagnose MRSA infection nor to guide or monitor treatment for MRSA infections. Test performance is not FDA approved in patients less than 93 years old. Performed at [facility], [address] [place], [place] [address]      Time coordinating discharge: Over 30 minutes  SIGNED:   Wyvonnia Dusky, MD  [facility] 11/05/2020, 12:00 PM Pager   If 7PM-7AM, please contact night-coverage

## 2020-11-05 NOTE — Progress Notes (Signed)
PCR Covid test performed for [facility] placement. Test came back positive. MD and case mgt made aware. Pt placed on airborne/contact precautions. Per [facility], pt must be quarantined for 12 days. Pt and pts son informed. Will continue to monitor.

## 2020-11-06 DIAGNOSIS — U071 COVID-19: Secondary | ICD-10-CM

## 2020-11-06 DIAGNOSIS — N182 Chronic kidney disease, stage 2 (mild): Secondary | ICD-10-CM

## 2020-11-06 DIAGNOSIS — E876 Hypokalemia: Secondary | ICD-10-CM

## 2020-11-06 DIAGNOSIS — S12091S Other nondisplaced fracture of first cervical vertebra, sequela: Secondary | ICD-10-CM

## 2020-11-06 DIAGNOSIS — E1122 Type 2 diabetes mellitus with diabetic chronic kidney disease: Secondary | ICD-10-CM

## 2020-11-06 DIAGNOSIS — K5900 Constipation, unspecified: Secondary | ICD-10-CM

## 2020-11-06 DIAGNOSIS — S12001A Unspecified nondisplaced fracture of first cervical vertebra, initial encounter for closed fracture: Secondary | ICD-10-CM

## 2020-11-06 LAB — BASIC METABOLIC PANEL
Anion gap: 7 (ref 5–15)
BUN: 12 mg/dL (ref 8–23)
CO2: 28 mmol/L (ref 22–32)
Calcium: 8.4 mg/dL — ABNORMAL LOW (ref 8.9–10.3)
Chloride: 102 mmol/L (ref 98–111)
Creatinine, Ser: 0.86 mg/dL (ref 0.44–1.00)
GFR, Estimated: >60 mL/min (ref >60)
Glucose, Bld: 170 mg/dL — ABNORMAL HIGH (ref 70–99)
Potassium: 3.6 mmol/L (ref 3.5–5.1)
Sodium: 137 mmol/L (ref 135–145)

## 2020-11-06 LAB — GLUCOSE, CAPILLARY
Glucose-Capillary: 187 mg/dL — ABNORMAL HIGH (ref 70–99)
Glucose-Capillary: 229 mg/dL — ABNORMAL HIGH (ref 70–99)
Glucose-Capillary: 260 mg/dL — ABNORMAL HIGH (ref 70–99)
Glucose-Capillary: 240 mg/dL — ABNORMAL HIGH (ref 70–99)

## 2020-11-06 LAB — CBC
HCT: 30.1 % — ABNORMAL LOW (ref 36.0–46.0)
Hemoglobin: 9.9 g/dL — ABNORMAL LOW (ref 12.0–15.0)
MCH: 31.1 pg (ref 26.0–34.0)
MCHC: 32.9 g/dL (ref 30.0–36.0)
MCV: 94.7 fL (ref 80.0–100.0)
Platelets: 170 10*3/uL (ref 150–400)
RBC: 3.18 MIL/uL — ABNORMAL LOW (ref 3.87–5.11)
RDW: 14.3 % (ref 11.5–15.5)
WBC: 6.6 10*3/uL (ref 4.0–10.5)
nRBC: 0 % (ref 0.0–0.2)

## 2020-11-06 LAB — URINALYSIS, COMPLETE (UACMP) WITH MICROSCOPIC
Bilirubin Urine: NEGATIVE
Glucose, UA: NEGATIVE mg/dL
Ketones, ur: NEGATIVE mg/dL
Nitrite: POSITIVE — AB
Protein, ur: 100 mg/dL — AB
RBC / HPF: >50 RBC/hpf — ABNORMAL HIGH (ref 0–5)
Specific Gravity, Urine: 1.015 (ref 1.005–1.030)
Squamous Epithelial / HPF: >50 — ABNORMAL HIGH (ref 0–5)
WBC, UA: >50 WBC/hpf — ABNORMAL HIGH (ref 0–5)
pH: 5 (ref 5.0–8.0)

## 2020-11-06 MED ORDER — POLYETHYLENE GLYCOL 3350 17 G PO PACK
17.0000 g | PACK | Freq: Every day | ORAL | Status: DC
  Administered 2020-11-06 – 2020-11-07 (×2): 17 g via ORAL
  Filled 2020-11-06 (×2): qty 1

## 2020-11-06 MED ORDER — CEFTRIAXONE SODIUM 1 G IJ SOLR
1.0000 g | INTRAMUSCULAR | Status: DC
  Administered 2020-11-06: 1 g via INTRAVENOUS
  Filled 2020-11-06 (×2): qty 10

## 2020-11-06 MED ORDER — INSULIN ASPART 100 UNIT/ML IJ SOLN
2.0000 [IU] | Freq: Three times a day (TID) | INTRAMUSCULAR | Status: DC
  Administered 2020-11-06 – 2020-11-07 (×3): 2 [IU] via SUBCUTANEOUS
  Filled 2020-11-06 (×3): qty 1

## 2020-11-06 MED ORDER — SENNA 8.6 MG PO TABS
1.0000 | ORAL_TABLET | Freq: Every day | ORAL | Status: DC
  Administered 2020-11-06 – 2020-11-07 (×2): 8.6 mg via ORAL
  Filled 2020-11-06 (×2): qty 1

## 2020-11-06 NOTE — Progress Notes (Signed)
Patient ID: [name], female   DOB: [birth year], 80 y.o.   MRN: [id number] [facility] PROGRESS NOTE  JEANNENE TSCHETTER OJJ:[id number] DOB: 01-14-1941 DOA: 10/29/2020 PCP: Venia Carbon, MD  HPI/Subjective: Patient feels fine and has no COVID symptoms.  She was actually diagnosed with COVID on 09/28/2020.  Admitted on 10/29/2020 with C1 fracture and hemorrhagic stroke.  Objective: Vitals:   11/06/20 0830 11/06/20 1205  BP: (!) 143/73 (!) 155/86  Pulse: 89 95  Resp: 18 20  Temp:    SpO2: 95% 97%    Intake/Output Summary (Last 24 hours) at 11/06/2020 1508 Last data filed at 11/05/2020 2133 Gross per 24 hour  Intake --  Output 800 ml  Net -800 ml   Filed Weights   10/29/20 1447 11/01/20 2230 11/05/20 0533  Weight: 79.4 kg 77.9 kg 82.3 kg    ROS: Review of Systems  Respiratory:  Negative for shortness of breath.   Cardiovascular:  Negative for chest pain.  Gastrointestinal:  Negative for abdominal pain, nausea and vomiting.  Musculoskeletal:  Positive for neck pain.  Exam: Physical Exam HENT:     Head: Normocephalic.     Mouth/Throat:     Pharynx: No oropharyngeal exudate.  Eyes:     General: Lids are normal.     Conjunctiva/sclera: Conjunctivae normal.     Pupils: Pupils are equal, round, and reactive to light.  Cardiovascular:     Rate and Rhythm: Normal rate and regular rhythm.     Heart sounds: Normal heart sounds, S1 normal and S2 normal.  Pulmonary:     Breath sounds: Normal breath sounds. No decreased breath sounds, wheezing, rhonchi or rales.  Abdominal:     Palpations: Abdomen is soft.     Tenderness: There is no abdominal tenderness.  Musculoskeletal:     Right ankle: No swelling.     Left ankle: No swelling.  Skin:    General: Skin is warm.     Findings: No rash.  Neurological:     Mental Status: She is alert and oriented to person, place, and time.     Comments: Able to straight leg raise bilaterally.      Scheduled Meds:   stroke:  mapping our early stages of recovery book   Does not apply Once   Chlorhexidine Gluconate Cloth  6 each Topical Daily   docusate sodium  200 mg Oral BID   doxazosin  2 mg Oral QHS   feeding supplement  237 mL Oral TID BM   gabapentin  100 mg Oral BID   losartan  100 mg Oral Daily   And   hydrochlorothiazide  25 mg Oral Daily   insulin aspart  0-9 Units Subcutaneous TID WC   multivitamin with minerals  1 tablet Oral Daily   polyethylene glycol  17 g Oral Daily   saccharomyces boulardii  250 mg Oral BID   senna  1 tablet Oral Daily   venlafaxine  37.5 mg Oral BID   Continuous Infusions:  ondansetron (ZOFRAN) IV Stopped (10/30/20 1159)    Assessment/Plan:  COVID-19 positive on 11/05/2020.  Patient has no symptoms.  The patient was diagnosed with COVID on 09/28/2020 with a positive test at that time.  I do think that this is a false positive test with the COVID PCR.  CT value 41.2 which goes along with a previous infection (not active).  I will speak with infection control and then see if I can discontinue the isolation. Subacute hemorrhagic stroke.  MRI of the brain showing a 2.5 x 1.1 x 1.8 cm well-circumscribed lenticular lesion involving the right lenticular form panniculus and external capsule.  Seen by neurology. C1 fracture.  In hard collar.  Will need repeat follow-up with neurosurgery as outpatient. Status post fall and back pain.  Lumbar spine MRI showed degenerative changes Constipation.  We will give MiraLAX.  Had diarrhea prior to coming in.  C. difficile was negative. Hypokalemia and hypomagnesemia treated during the hospital course Essential hypertension on Cardura, losartan and hydrochlorothiazide Type 2 diabetes mellitus chronic kidney disease stage 2.  change diet to carb modified.  Patient on sliding scale insulin.  Will add 2 units prior to meals.        Code Status:     Code Status Orders  (From admission, onward)           Start     Ordered   10/29/20  2314  Full code  Continuous        10/29/20 2318           Code Status History     This patient has a current code status but no historical code status.      Family Communication: Spoke with son on the phone Disposition Plan: Status is: Inpatient  Dispo: The patient is from: Home              Anticipated d/c is to: Rehab              Patient currently medically stable.   Difficult to place patient.  No.  Time spent: 28 minutes  [place]

## 2020-11-06 NOTE — Progress Notes (Signed)
Patient ID: [name], female   DOB: 1940/11/28, 80 y.o.   MRN: [id number]  Discontinuing isolation for this patient.  Case discussed with infection control.  Since the patient was diagnosed with COVID on 09/28/2020, her repeat test is likely a false positive.  This can happen within 90 days of a retest.  Her CT value was 41.2 which goes against an active infection.  Dr Loletha Grayer

## 2020-11-06 NOTE — TOC Progression Note (Signed)
Transition of Care [facility]) - Progression Note    Patient Details  Name: [name] MRN: [id number] Date of Birth: 12-06-1940  [facility]) CM/SW [place], [place] Phone Number: 11/06/2020, 8:12 AM  Clinical Narrative:     CSW notes patient tested COVID + on 9/27 prior to preparing to dc to [facility].   Per [facility], patient must wait 12 days to dc to them due to positive test.   Estimated dc time frame is 10/9.   Expected Discharge Plan: [place] Barriers to Discharge: No Barriers Identified  Expected Discharge Plan and Services Expected Discharge Plan: [place] In-house Referral: NA   Post Acute Care Choice: [place] Living arrangements for the past 2 months: Single Family Home Expected Discharge Date: 11/05/20                                     Social Determinants of Health (SDOH) Interventions    Readmission Risk Interventions No flowsheet data found.

## 2020-11-06 NOTE — Progress Notes (Signed)
Physical Therapy Treatment Patient Details Name: [name] MRN: [id number] DOB: [birth year] Today's Date: 11/06/2020   History of Present Illness 80 y/o female with PMH significant for DM, HTN and arthritis.  Pt came in after a fall; apparently she was going to the MD and son was using w/c to get her down the steps when she tipped back and hit her head.  Imaging reveals C1 body fracture as well as possible hemorrhagic stroke.  Pt had Covid ~2 months prior to admission and apparently has been very weak (nearly w/c bound) and not able to eat in that time.    PT Comments    Pt was long sitting in bed with cervical collar in place. She was able to state precautions and was A and O x 4. Agrees to session and is pleasant throughout. Session greatly limited by BM/urination. She performed log roll L to short sit to exit bed with min assist + vcs. Stood with min assist and ambulated ~ 20 ft with RW CGA. No LOB or safety concern with ambulation however overall does have weakness. Acute PT will continue to follow and progress Pt per current POC. Recommend DC to SNF to progress pt to PLOF.    Recommendations for follow up therapy are one component of a multi-disciplinary discharge planning process, led by the attending physician.  Recommendations may be updated based on patient status, additional functional criteria and insurance authorization.  Follow Up Recommendations  SNF;Supervision/Assistance - 24 hour     Equipment Recommendations  Other (comment) (defer to next level of care)       Precautions / Restrictions Precautions Precautions: Fall;Cervical Precaution Comments: author adjusted cervical collar to proper fit Required Braces or Orthoses: Cervical Brace Restrictions Weight Bearing Restrictions: No     Mobility  Bed Mobility Overal bed mobility: Needs Assistance Bed Mobility: Supine to Sit;Rolling;Sidelying to Sit Rolling: Min guard Sidelying to sit: Min assist Supine to sit:  Min assist Sit to supine: Min assist    Transfers Overall transfer level: Needs assistance Equipment used: Rolling walker (2 wheeled) Transfers: Sit to/from Stand Sit to Stand: Min assist;From elevated surface    General transfer comment: Pt requires min assist to stand from EOB and from [facility]. vcs for handplacement and improved fwd wt shift throughout  Ambulation/Gait Ambulation/Gait assistance: Min guard Gait Distance (Feet): 20 Feet Assistive device: Rolling walker (2 wheeled) Gait Pattern/deviations: Step-to pattern Gait velocity: decreased   General Gait Details: Pt was limited by need to use BR. will progress gait distances next session. no LOB but does require CGA for safety    Balance Overall balance assessment: Needs assistance Sitting-balance support: No upper extremity supported;Feet supported Sitting balance-Leahy Scale: Good     Standing balance support: Bilateral upper extremity supported;During functional activity Standing balance-Leahy Scale: Fair Standing balance comment: pt is at high fall risk but overall seems to be improving. session greatly impacted by need to urinate and then have a BM      Cognition Arousal/Alertness: Awake/alert Behavior During Therapy: WFL for tasks assessed/performed Overall Cognitive Status: Within Functional Limits for tasks assessed        General Comments: Pt is alert and pleasant throughout. A and O x 4             Pertinent Vitals/Pain Pain Assessment: No/denies pain Pain Score: 0-No pain Faces Pain Scale: No hurt Pain Intervention(s): Limited activity within patient's tolerance;Monitored during session;Repositioned     PT Goals (current goals can now be found  in the care plan section) Acute Rehab PT Goals Patient Stated Goal: " I want to get better" Progress towards PT goals: Progressing toward goals    Frequency    Min 2X/week      PT Plan Current plan remains appropriate       AM-PAC PT "6 Clicks"  Mobility   Outcome Measure  Help needed turning from your back to your side while in a flat bed without using bedrails?: A Little Help needed moving from lying on your back to sitting on the side of a flat bed without using bedrails?: A Little Help needed moving to and from a bed to a chair (including a wheelchair)?: A Lot Help needed standing up from a chair using your arms (e.g., wheelchair or bedside chair)?: A Lot Help needed to walk in hospital room?: A Lot Help needed climbing 3-5 steps with a railing? : A Little 6 Click Score: 15    End of Session Equipment Utilized During Treatment: Gait belt Activity Tolerance: Patient tolerated treatment well Patient left: in bed;with call bell/phone within reach Nurse Communication: Mobility status PT Visit Diagnosis: Muscle weakness (generalized) (M62.81);Difficulty in walking, not elsewhere classified (R26.2);Unsteadiness on feet (R26.81);Dizziness and giddiness (R42);Other symptoms and signs involving the nervous system (R29.898)     Time: 3335-4562 PT Time Calculation (min) (ACUTE ONLY): 21 min  Charges:  $Therapeutic Activity: 8-22 mins                    Julaine Fusi PTA 11/06/20, 5:29 PM

## 2020-11-06 NOTE — Progress Notes (Signed)
Inpatient Diabetes Program Recommendations  AACE/ADA: New Consensus Statement on Inpatient Glycemic Control   Target Ranges:  Prepandial:   less than 140 mg/dL      Peak postprandial:   less than 180 mg/dL (1-2 hours)      Critically ill patients:  140 - 180 mg/dL   Results for HAVA, [place] (MRN [id number]) as of 11/06/2020 09:16  Ref. Range 11/05/2020 09:40 11/05/2020 12:20 11/05/2020 17:46 11/05/2020 21:29  Glucose-Capillary Latest Ref Range: 70 - 99 mg/dL 161 (H) 187 (H) 268 (H) 283 (H)    Review of Glycemic Control  Diabetes history: DM2 hx Outpatient Diabetes medications: Metformin 1500 mg daily, Glipizide XL 10 mg daily (per home med list not taking Metformin nor Glipizide) Current orders for Inpatient glycemic control: Novolog 0-9 units TID with meals   Inpatient Diabetes Program Recommendations:     Diet: Post prandial glucose consistently elevated. Please consider discontinuing Regular diet and ordering Carb Modified diet if appropriate. If change is made and post prandial glucose continues to be consistently elevated, may need to add low dose meal coverage insulin (such as Novolog 2 units TID with meals).  Thanks, Barnie Alderman, RN, MSN, CDE Diabetes Coordinator Inpatient Diabetes Program [phone] (Team Pager from 8am to 5pm)

## 2020-11-07 DIAGNOSIS — R600 Localized edema: Secondary | ICD-10-CM | POA: Diagnosis not present

## 2020-11-07 DIAGNOSIS — E876 Hypokalemia: Secondary | ICD-10-CM | POA: Diagnosis not present

## 2020-11-07 DIAGNOSIS — Z87828 Personal history of other (healed) physical injury and trauma: Secondary | ICD-10-CM | POA: Diagnosis not present

## 2020-11-07 DIAGNOSIS — I63523 Cerebral infarction due to unspecified occlusion or stenosis of bilateral anterior cerebral arteries: Secondary | ICD-10-CM | POA: Diagnosis not present

## 2020-11-07 DIAGNOSIS — S12091S Other nondisplaced fracture of first cervical vertebra, sequela: Secondary | ICD-10-CM | POA: Diagnosis not present

## 2020-11-07 DIAGNOSIS — I619 Nontraumatic intracerebral hemorrhage, unspecified: Secondary | ICD-10-CM | POA: Diagnosis not present

## 2020-11-07 DIAGNOSIS — Z743 Need for continuous supervision: Secondary | ICD-10-CM | POA: Diagnosis not present

## 2020-11-07 DIAGNOSIS — M25572 Pain in left ankle and joints of left foot: Secondary | ICD-10-CM | POA: Diagnosis not present

## 2020-11-07 DIAGNOSIS — F32A Depression, unspecified: Secondary | ICD-10-CM | POA: Diagnosis not present

## 2020-11-07 DIAGNOSIS — I1310 Hypertensive heart and chronic kidney disease without heart failure, with stage 1 through stage 4 chronic kidney disease, or unspecified chronic kidney disease: Secondary | ICD-10-CM | POA: Diagnosis not present

## 2020-11-07 DIAGNOSIS — N3001 Acute cystitis with hematuria: Secondary | ICD-10-CM | POA: Diagnosis not present

## 2020-11-07 DIAGNOSIS — E1122 Type 2 diabetes mellitus with diabetic chronic kidney disease: Secondary | ICD-10-CM | POA: Diagnosis not present

## 2020-11-07 DIAGNOSIS — Z8781 Personal history of (healed) traumatic fracture: Secondary | ICD-10-CM | POA: Diagnosis not present

## 2020-11-07 DIAGNOSIS — S12000D Unspecified displaced fracture of first cervical vertebra, subsequent encounter for fracture with routine healing: Secondary | ICD-10-CM | POA: Diagnosis not present

## 2020-11-07 DIAGNOSIS — R2681 Unsteadiness on feet: Secondary | ICD-10-CM | POA: Diagnosis not present

## 2020-11-07 DIAGNOSIS — M2578 Osteophyte, vertebrae: Secondary | ICD-10-CM | POA: Diagnosis not present

## 2020-11-07 DIAGNOSIS — E119 Type 2 diabetes mellitus without complications: Secondary | ICD-10-CM | POA: Diagnosis not present

## 2020-11-07 DIAGNOSIS — M47812 Spondylosis without myelopathy or radiculopathy, cervical region: Secondary | ICD-10-CM | POA: Diagnosis not present

## 2020-11-07 DIAGNOSIS — S12001S Unspecified nondisplaced fracture of first cervical vertebra, sequela: Secondary | ICD-10-CM | POA: Diagnosis not present

## 2020-11-07 DIAGNOSIS — Z741 Need for assistance with personal care: Secondary | ICD-10-CM | POA: Diagnosis not present

## 2020-11-07 DIAGNOSIS — M6281 Muscle weakness (generalized): Secondary | ICD-10-CM | POA: Diagnosis not present

## 2020-11-07 DIAGNOSIS — I609 Nontraumatic subarachnoid hemorrhage, unspecified: Secondary | ICD-10-CM | POA: Diagnosis not present

## 2020-11-07 DIAGNOSIS — R2242 Localized swelling, mass and lump, left lower limb: Secondary | ICD-10-CM | POA: Diagnosis not present

## 2020-11-07 DIAGNOSIS — K3182 Dieulafoy lesion (hemorrhagic) of stomach and duodenum: Secondary | ICD-10-CM | POA: Diagnosis not present

## 2020-11-07 DIAGNOSIS — R339 Retention of urine, unspecified: Secondary | ICD-10-CM | POA: Diagnosis not present

## 2020-11-07 DIAGNOSIS — I951 Orthostatic hypotension: Secondary | ICD-10-CM | POA: Diagnosis not present

## 2020-11-07 DIAGNOSIS — K5900 Constipation, unspecified: Secondary | ICD-10-CM | POA: Diagnosis not present

## 2020-11-07 DIAGNOSIS — K567 Ileus, unspecified: Secondary | ICD-10-CM | POA: Diagnosis not present

## 2020-11-07 DIAGNOSIS — N3091 Cystitis, unspecified with hematuria: Secondary | ICD-10-CM | POA: Diagnosis not present

## 2020-11-07 DIAGNOSIS — I959 Hypotension, unspecified: Secondary | ICD-10-CM | POA: Diagnosis not present

## 2020-11-07 DIAGNOSIS — N390 Urinary tract infection, site not specified: Secondary | ICD-10-CM | POA: Diagnosis not present

## 2020-11-07 DIAGNOSIS — R279 Unspecified lack of coordination: Secondary | ICD-10-CM | POA: Diagnosis not present

## 2020-11-07 DIAGNOSIS — I10 Essential (primary) hypertension: Secondary | ICD-10-CM | POA: Diagnosis not present

## 2020-11-07 DIAGNOSIS — Z09 Encounter for follow-up examination after completed treatment for conditions other than malignant neoplasm: Secondary | ICD-10-CM | POA: Diagnosis not present

## 2020-11-07 DIAGNOSIS — M4312 Spondylolisthesis, cervical region: Secondary | ICD-10-CM | POA: Diagnosis not present

## 2020-11-07 DIAGNOSIS — S12000A Unspecified displaced fracture of first cervical vertebra, initial encounter for closed fracture: Secondary | ICD-10-CM | POA: Diagnosis not present

## 2020-11-07 DIAGNOSIS — R9431 Abnormal electrocardiogram [ECG] [EKG]: Secondary | ICD-10-CM | POA: Diagnosis not present

## 2020-11-07 DIAGNOSIS — F329 Major depressive disorder, single episode, unspecified: Secondary | ICD-10-CM | POA: Diagnosis not present

## 2020-11-07 DIAGNOSIS — E118 Type 2 diabetes mellitus with unspecified complications: Secondary | ICD-10-CM | POA: Diagnosis not present

## 2020-11-07 DIAGNOSIS — M79672 Pain in left foot: Secondary | ICD-10-CM | POA: Diagnosis not present

## 2020-11-07 DIAGNOSIS — I639 Cerebral infarction, unspecified: Secondary | ICD-10-CM | POA: Diagnosis not present

## 2020-11-07 DIAGNOSIS — I629 Nontraumatic intracranial hemorrhage, unspecified: Secondary | ICD-10-CM | POA: Diagnosis not present

## 2020-11-07 DIAGNOSIS — U071 COVID-19: Secondary | ICD-10-CM | POA: Diagnosis not present

## 2020-11-07 DIAGNOSIS — R000 Tachycardia, unspecified: Secondary | ICD-10-CM | POA: Diagnosis not present

## 2020-11-07 DIAGNOSIS — R931 Abnormal findings on diagnostic imaging of heart and coronary circulation: Secondary | ICD-10-CM | POA: Diagnosis not present

## 2020-11-07 LAB — GLUCOSE, CAPILLARY
Glucose-Capillary: 198 mg/dL — ABNORMAL HIGH (ref 70–99)
Glucose-Capillary: 265 mg/dL — ABNORMAL HIGH (ref 70–99)

## 2020-11-07 MED ORDER — SENNA 8.6 MG PO TABS: 1.0000 | ORAL_TABLET | Freq: Every day | ORAL | 0 refills | Status: DC

## 2020-11-07 MED ORDER — GLIPIZIDE ER 2.5 MG PO TB24: 2.5000 mg | ORAL_TABLET | Freq: Every day | ORAL | 0 refills | Status: DC

## 2020-11-07 MED ORDER — POLYETHYLENE GLYCOL 3350 17 G PO PACK
17.0000 g | PACK | Freq: Every day | ORAL | 0 refills | Status: DC
Start: 2020-11-08 — End: 2021-01-08

## 2020-11-07 MED ORDER — SACCHAROMYCES BOULARDII 250 MG PO CAPS: 250.0000 mg | ORAL_CAPSULE | Freq: Two times a day (BID) | ORAL | 0 refills | Status: DC

## 2020-11-07 MED ORDER — OXYCODONE-ACETAMINOPHEN 5-325 MG PO TABS: 1.0000 | ORAL_TABLET | Freq: Four times a day (QID) | ORAL | 0 refills | Status: AC | PRN

## 2020-11-07 MED ORDER — LOSARTAN POTASSIUM-HCTZ 100-25 MG PO TABS: 1.0000 | ORAL_TABLET | Freq: Every day | ORAL | 3 refills | Status: DC

## 2020-11-07 MED ORDER — CEPHALEXIN 500 MG PO CAPS: 500.0000 mg | ORAL_CAPSULE | Freq: Three times a day (TID) | ORAL | 0 refills | Status: AC

## 2020-11-07 NOTE — Care Management Important Message (Signed)
Important Message  Patient Details  Name: [name] MRN: [id number] Date of Birth: 1940/05/23   Medicare Important Message Given:  Yes     Dannette Barbara 11/07/2020, 4:17 PM

## 2020-11-07 NOTE — TOC Transition Note (Signed)
Transition of Care [facility]) - CM/SW Discharge Note   Patient Details  Name: [name] MRN: [id number] Date of Birth: 1940/06/02  Transition of Care [facility]) CM/SW Contact:  Alberteen Sam, LCSW Phone Number: 11/07/2020, 12:02 PM   Clinical Narrative:     Patient will DC to: [facility] Anticipated DC date: 11/07/20 Family notified: son [name] Transport [id number]  Per MD patient ready for DC to [facility]. RN, patient, patient's family, and facility notified of DC. Discharge Summary sent to facility. RN given number for report   [phone] room 606B. DC packet on chart. Ambulance transport requested for patient.  CSW signing off.  Pricilla Riffle, LCSW    Final next level of care: Skilled Nursing Facility Barriers to Discharge: No Barriers Identified   Patient Goals and CMS Choice Patient states their goals for this hospitalization and ongoing recovery are:: to go home CMS Medicare.gov Compare Post Acute Care list provided to:: Patient Choice offered to / list presented to : Patient  Discharge Placement                Patient to be transferred to facility by: [facility]   Patient and family notified of of transfer: 11/07/20  Discharge Plan and Services In-house Referral: NA   Post Acute Care Choice: [place]                               Social Determinants of Health (SDOH) Interventions     Readmission Risk Interventions No flowsheet data found.

## 2020-11-07 NOTE — Discharge Summary (Signed)
[place] [facility] [place] NAME: [name]    MR#:  [id number]  DATE OF BIRTH:  10-Nov-1940  DATE OF ADMISSION:  10/29/2020 ADMITTING PHYSICIAN: Anda Latina, MD  DATE OF DISCHARGE: 11/07/2020  PRIMARY CARE PHYSICIAN: Venia Carbon, MD    ADMISSION DIAGNOSIS:  Hemorrhagic stroke [facility]) [I61.9] Cerebral brain hemorrhage ([place]) [I61.9] Fall [W19.XXXA]  DISCHARGE DIAGNOSIS:  Active Problems:   Essential hypertension   GERD   Fibromyalgia   Osteoarthritis, knee   Type 2 diabetes, uncontrolled, with neuropathy (HCC)   Iron deficiency anemia   Stage 3b chronic kidney disease (HCC)   Hemorrhagic stroke (HCC)   Cerebral brain hemorrhage ([place])   COVID-19 virus infection   Closed nondisplaced fracture of first cervical vertebra (HCC)   Constipation   Hypokalemia   CKD stage 2 due to type 2 diabetes mellitus ([place])   SECONDARY DIAGNOSIS:   Past Medical History:  Diagnosis Date  . Allergic rhinitis   . Anal fissure   . Arthritis   . Asthma   . Depression   . Diabetes mellitus    type 2  . Diverticulosis   . Fibromyalgia   . GERD (gastroesophageal reflux disease)   . Hypertension   . Obesity   . Osteoarthritis     HOSPITAL COURSE:   Subacute hemorrhagic stroke.  MRI of the brain showing a 2.5 x 1.1 x 1.8 cm well-circumscribed lenticular lesion involving the right lenticular form nucleus.  Will need further imaging as outpatient.  Seen by neurology as inpatient. C1 fracture.  In hard collar.  Will need repeat imaging and follow-up with neurosurgery as outpatient. Status post fall and back pain.  MRI of the lumbar spine showed degenerative changes. Constipation.  Continue MiraLAX and senna.  Patient did have diarrhea prior to coming into the hospital and C. difficile was negative at that time. Hypokalemia and hypomagnesemia.  This was treated during the hospital course. Essential hypertension on losartan hydrochlorothiazide Type 2  diabetes mellitus with chronic kidney disease stage II.  Carb modified diet.  Can go back on Glucophage and low-dose glipizide XL as outpatient. COVID-19 positive on 09/28/2020.  Prior to getting out to rehab had a COVID-positive test on 11/05/2020.  This is a false positive test.  Anytime we repeated test on a patient that was positive within 90 days we have the possibility of coming positive again.  The CT value was 41.2 which goes along with a previous infection and not an active infection.  I spoke with infection control and discontinued isolation. Acute cystitis with hematuria.  Urinalysis positive.  Urine culture sent off but still pending.  Give a dose of Rocephin last night.  We will switch over to Keflex for a few more days orally.  DISCHARGE CONDITIONS:   Satisfactory  CONSULTS OBTAINED:  Neurosurgery Neurology  DRUG ALLERGIES:   Allergies  Allergen Reactions  . Diphenhydramine Hcl Other (See Comments)    hallucinations   . Fluoxetine Hcl     REACTION: raised blood pressure  . Lisinopril     REACTION: dizzy  . Sertraline Hcl     REACTION: dizziness    DISCHARGE MEDICATIONS:   Allergies as of 11/07/2020       Reactions   Diphenhydramine Hcl Other (See Comments)   hallucinations    Fluoxetine Hcl    REACTION: raised blood pressure   Lisinopril    REACTION: dizzy   Sertraline Hcl    REACTION: dizziness  Medication List     STOP taking these medications    aspirin 81 MG tablet   COSAMIN DS PO   HYDROcodone-acetaminophen 5-325 MG tablet Commonly known as: Norco   tiZANidine 4 MG tablet Commonly known as: ZANAFLEX   VITAMIN B12 PO       TAKE these medications    Accu-Chek Guide test strip Generic drug: glucose blood Use to check blood sugar once daily Dx Code E11.40   Accu-Chek Softclix Lancets lancets Use to obtain blood sugar sample Dx Code E11.40   cephALEXin 500 MG capsule Commonly known as: KEFLEX Take 1 capsule (500 mg total) by  mouth 3 (three) times daily for 3 days.   doxazosin 4 MG tablet Commonly known as: CARDURA TAKE 1/2 TABLET TWICE DAILY   gabapentin 100 MG capsule Commonly known as: NEURONTIN TAKE 1 CAPSULE TWICE DAILY   glipiZIDE 2.5 MG 24 hr tablet Commonly known as: glipiZIDE XL Take 1 tablet (2.5 mg total) by mouth daily with breakfast. What changed:  medication strength how much to take when to take this   hyoscyamine 0.125 MG tablet Commonly known as: LEVSIN Take 1 tablet (0.125 mg total) by mouth 3 (three) times daily as needed. Before meals. Don't use imodium also   Integra Plus Caps Take 1 capsule by mouth every morning.   lidocaine 5 % Commonly known as: Lidoderm Place 1 patch onto the skin every 12 (twelve) hours. Remove & Discard patch within 12 hours or as directed by MD   losartan-hydrochlorothiazide 100-25 MG tablet Commonly known as: HYZAAR Take 1 tablet by mouth daily.   metFORMIN 750 MG 24 hr tablet Commonly known as: GLUCOPHAGE-XR TAKE 2 TABLETS EVERY DAY WITH BREAKFAST   multivitamin tablet Take 1 tablet by mouth daily.   ondansetron 4 MG disintegrating tablet Commonly known as: Zofran ODT Take 1 tablet (4 mg total) by mouth every 6 (six) hours as needed for nausea or vomiting.   OVER THE COUNTER MEDICATION Knock Out All Natural Sleep Aid   oxyCODONE-acetaminophen 5-325 MG tablet Commonly known as: PERCOCET/ROXICET Take 1 tablet by mouth every 6 (six) hours as needed for up to 1 day for moderate pain or severe pain. What changed: reasons to take this   polyethylene glycol 17 g packet Commonly known as: MIRALAX / GLYCOLAX Take 17 g by mouth daily. Start taking on: November 08, 2020   potassium chloride SA 20 MEQ tablet Commonly known as: KLOR-CON Take 1 tablet (20 mEq total) by mouth 2 (two) times daily for 5 days.   saccharomyces boulardii 250 MG capsule Commonly known as: FLORASTOR Take 1 capsule (250 mg total) by mouth 2 (two) times daily.    senna 8.6 MG Tabs tablet Commonly known as: SENOKOT Take 1 tablet (8.6 mg total) by mouth daily. Start taking on: November 08, 2020   venlafaxine 37.5 MG tablet Commonly known as: EFFEXOR TAKE 1 TABLET TWICE DAILY         DISCHARGE INSTRUCTIONS:   Follow-up Dr. Ernst Spell 1 day Follow-up neurosurgery Follow-up neurology  If you experience worsening of your admission symptoms, develop shortness of breath, life threatening emergency, suicidal or homicidal thoughts you must seek medical attention immediately by calling 911 or calling your MD immediately  if symptoms less severe.  You Must read complete instructions/literature along with all the possible adverse reactions/side effects for all the Medicines you take and that have been prescribed to you. Take any new Medicines after you have completely understood and accept all the  possible adverse reactions/side effects.   Please note  You were cared for by a hospitalist during your hospital stay. If you have any questions about your discharge medications or the care you received while you were in the hospital after you are discharged, you can call the unit and asked to speak with the hospitalist on call if the hospitalist that took care of you is not available. Once you are discharged, your primary care physician will handle any further medical issues. Please note that NO REFILLS for any discharge medications will be authorized once you are discharged, as it is imperative that you return to your primary care physician (or establish a relationship with a primary care physician if you do not have one) for your aftercare needs so that they can reassess your need for medications and monitor your lab values.    Today   CHIEF COMPLAINT:   Chief Complaint  Patient presents with  . Fall    HISTORY OF PRESENT ILLNESS:  [name]  is a 80 y.o. female came in after a fall found to have a C1 fracture and brain hemorrhage   VITAL SIGNS:   Blood pressure 130/81, pulse 90, temperature 97.9 F (36.6 C), resp. rate 17, height 5\' 4"  (1.626 m), weight 82.3 kg, SpO2 93 %.  I/O:   Intake/Output Summary (Last 24 hours) at 11/07/2020 1026 Last data filed at 11/07/2020 0950 Gross per 24 hour  Intake 700 ml  Output 300 ml  Net 400 ml    PHYSICAL EXAMINATION:  GENERAL:  80 y.o.-year-old patient lying in the bed with no acute distress.  EYES: Pupils equal, round, reactive to light and accommodation. No scleral icterus. HEENT: Head atraumatic, normocephalic. Oropharynx and nasopharynx clear.  LUNGS: Normal breath sounds bilaterally, no wheezing, rales,rhonchi or crepitation. No use of accessory muscles of respiration.  CARDIOVASCULAR: S1, S2 normal. No murmurs, rubs, or gallops.  ABDOMEN: Soft, non-tender, non-distended. EXTREMITIES: No pedal edema.  NEUROLOGIC: Cranial nerves II through XII are intact.  Able to straight leg raise bilaterally PSYCHIATRIC: The patient is alert and oriented x 3.  SKIN: No obvious rash, lesion, or ulcer.   DATA REVIEW:   CBC Recent Labs  Lab 11/06/20 0440  WBC 6.6  HGB 9.9*  HCT 30.1*  PLT 170    Chemistries  Recent Labs  Lab 11/04/20 0429 11/05/20 0806 11/06/20 0440  NA 136   < > 137  K 3.2*   < > 3.6  CL 103   < > 102  CO2 25   < > 28  GLUCOSE 139*   < > 170*  BUN 13   < > 12  CREATININE 0.80   < > 0.86  CALCIUM 8.0*   < > 8.4*  MG 1.8  --   --    < > = values in this interval not displayed.      Microbiology Results  Results for orders placed or performed during the hospital encounter of 10/29/20  C Difficile Quick Screen w PCR reflex     Status: None   Collection Time: 10/29/20 10:14 PM   Specimen: Stool  Result Value Ref Range Status   C Diff antigen NEGATIVE NEGATIVE Final   C Diff toxin NEGATIVE NEGATIVE Final   C Diff interpretation No C. difficile detected.  Final    Comment: Performed at [facility], [address] [place], [place] [address]  Resp  Panel by RT-PCR (Flu A&B, Covid) Nasopharyngeal Swab  Status: None   Collection Time: 10/29/20 11:31 PM   Specimen: Nasopharyngeal Swab; Nasopharyngeal(NP) swabs in vial transport medium  Result Value Ref Range Status   SARS Coronavirus 2 by RT PCR NEGATIVE NEGATIVE Final    Comment: (NOTE) SARS-CoV-2 target nucleic acids are NOT DETECTED.  The SARS-CoV-2 RNA is generally detectable in upper respiratory specimens during the acute phase of infection. The lowest concentration of SARS-CoV-2 viral copies this assay can detect is 138 copies/mL. A negative result does not preclude SARS-Cov-2 infection and should not be used as the sole basis for treatment or other patient management decisions. A negative result may occur with  improper specimen collection/handling, submission of specimen other than nasopharyngeal swab, presence of viral mutation(s) within the areas targeted by this assay, and inadequate number of viral copies(<138 copies/mL). A negative result must be combined with clinical observations, patient history, and epidemiological information. The expected result is Negative.  Fact Sheet for Patients:  EntrepreneurPulse.com.au  Fact Sheet for Healthcare Providers:  IncredibleEmployment.be  This test is no t yet approved or cleared by the [place] FDA and  has been authorized for detection and/or diagnosis of SARS-CoV-2 by FDA under an Emergency Use Authorization (EUA). This EUA will remain  in effect (meaning this test can be used) for the duration of the COVID-19 declaration under Section 564(b)(1) of the Act, 21 U.S.C.section 360bbb-3(b)(1), unless the authorization is terminated  or revoked sooner.       Influenza A by PCR NEGATIVE NEGATIVE Final   Influenza B by PCR NEGATIVE NEGATIVE Final    Comment: (NOTE) The Xpert Xpress SARS-CoV-2/FLU/RSV plus assay is intended as an aid in the diagnosis of influenza from Nasopharyngeal  swab specimens and should not be used as a sole basis for treatment. Nasal washings and aspirates are unacceptable for Xpert Xpress SARS-CoV-2/FLU/RSV testing.  Fact Sheet for Patients: EntrepreneurPulse.com.au  Fact Sheet for Healthcare Providers: IncredibleEmployment.be  This test is not yet approved or cleared by the [place] FDA and has been authorized for detection and/or diagnosis of SARS-CoV-2 by FDA under an Emergency Use Authorization (EUA). This EUA will remain in effect (meaning this test can be used) for the duration of the COVID-19 declaration under Section 564(b)(1) of the Act, 21 U.S.C. section 360bbb-3(b)(1), unless the authorization is terminated or revoked.  Performed at [facility], [address] [place], [place] [address]   Gastrointestinal Panel by PCR , Stool     Status: None   Collection Time: 10/30/20 11:55 AM   Specimen: Stool  Result Value Ref Range Status   Campylobacter species NOT DETECTED NOT DETECTED Final   Plesimonas shigelloides NOT DETECTED NOT DETECTED Final   Salmonella species NOT DETECTED NOT DETECTED Final   Yersinia enterocolitica NOT DETECTED NOT DETECTED Final   Vibrio species NOT DETECTED NOT DETECTED Final   Vibrio cholerae NOT DETECTED NOT DETECTED Final   Enteroaggregative E coli (EAEC) NOT DETECTED NOT DETECTED Final   Enteropathogenic E coli (EPEC) NOT DETECTED NOT DETECTED Final   Enterotoxigenic E coli (ETEC) NOT DETECTED NOT DETECTED Final   Shiga like toxin producing E coli (STEC) NOT DETECTED NOT DETECTED Final   Shigella/Enteroinvasive E coli (EIEC) NOT DETECTED NOT DETECTED Final   Cryptosporidium NOT DETECTED NOT DETECTED Final   Cyclospora cayetanensis NOT DETECTED NOT DETECTED Final   Entamoeba histolytica NOT DETECTED NOT DETECTED Final   Giardia lamblia NOT DETECTED NOT DETECTED Final   Adenovirus F40/41 NOT DETECTED NOT DETECTED Final   Astrovirus NOT  DETECTED  NOT DETECTED Final   Norovirus GI/GII NOT DETECTED NOT DETECTED Final   Rotavirus A NOT DETECTED NOT DETECTED Final   Sapovirus (I, II, IV, and V) NOT DETECTED NOT DETECTED Final    Comment: Performed at [facility], [address] [place], [place] [address]  MRSA Next Gen by PCR, Nasal     Status: None   Collection Time: 11/01/20  7:01 PM   Specimen: Nasal Mucosa; Nasal Swab  Result Value Ref Range Status   MRSA by PCR Next Gen NOT DETECTED NOT DETECTED Final    Comment: (NOTE) The GeneXpert MRSA Assay (FDA approved for NASAL specimens only), is one component of a comprehensive MRSA colonization surveillance program. It is not intended to diagnose MRSA infection nor to guide or monitor treatment for MRSA infections. Test performance is not FDA approved in patients less than 45 years old. Performed at [facility], [address] [place], [place] [address]   Resp Panel by RT-PCR (Flu A&B, Covid) Nasopharyngeal Swab     Status: Abnormal   Collection Time: 11/05/20 11:43 AM   Specimen: Nasopharyngeal Swab; Nasopharyngeal(NP) swabs in vial transport medium  Result Value Ref Range Status   SARS Coronavirus 2 by RT PCR POSITIVE (A) NEGATIVE Final    Comment: READ BACK AND VERIFIED BY ANN MORRISON, RN AT 4174 11/05/20 BY [facility] (NOTE) SARS-CoV-2 target nucleic acids are DETECTED.  The SARS-CoV-2 RNA is generally detectable in upper respiratory specimens during the acute phase of infection. Positive results are indicative of the presence of the identified virus, but do not rule out bacterial infection or co-infection with other pathogens not detected by the test. Clinical correlation with patient history and other diagnostic information is necessary to determine patient infection status. The expected result is Negative.  Fact Sheet for Patients: EntrepreneurPulse.com.au  Fact Sheet for Healthcare  Providers: IncredibleEmployment.be  This test is not yet approved or cleared by the [place] FDA and  has been authorized for detection and/or diagnosis of SARS-CoV-2 by FDA under an Emergency Use Authorization (EUA).  This EUA will remain in effect (meaning this test can be used) for the dura tion of  the COVID-19 declaration under Section 564(b)(1) of the Act, 21 U.S.C. section 360bbb-3(b)(1), unless the authorization is terminated or revoked sooner.     Influenza A by PCR NEGATIVE NEGATIVE Final   Influenza B by PCR NEGATIVE NEGATIVE Final    Comment: (NOTE) The Xpert Xpress SARS-CoV-2/FLU/RSV plus assay is intended as an aid in the diagnosis of influenza from Nasopharyngeal swab specimens and should not be used as a sole basis for treatment. Nasal washings and aspirates are unacceptable for Xpert Xpress SARS-CoV-2/FLU/RSV testing.  Fact Sheet for Patients: EntrepreneurPulse.com.au  Fact Sheet for Healthcare Providers: IncredibleEmployment.be  This test is not yet approved or cleared by the [place] FDA and has been authorized for detection and/or diagnosis of SARS-CoV-2 by FDA under an Emergency Use Authorization (EUA). This EUA will remain in effect (meaning this test can be used) for the duration of the COVID-19 declaration under Section 564(b)(1) of the Act, 21 U.S.C. section 360bbb-3(b)(1), unless the authorization is terminated or revoked.  Performed at [facility], [address] [place], [place] [address]       Management plans discussed with the patient, family and they are in agreement.  CODE STATUS:     Code Status Orders  (From admission, onward)           Start  Ordered   10/29/20 2314  Full code  Continuous        10/29/20 2318           Code Status History     This patient has a current code status but no historical code status.       TOTAL TIME TAKING  CARE OF THIS PATIENT: 34 minutes.    Loletha Grayer M.D on 11/07/2020 at 10:26 AM   [facility]  CC: Primary care physician; Venia Carbon, MD

## 2020-11-07 NOTE — Progress Notes (Signed)
Report called to [facility] RN at [facility].

## 2020-11-08 DIAGNOSIS — I959 Hypotension, unspecified: Secondary | ICD-10-CM | POA: Diagnosis not present

## 2020-11-08 DIAGNOSIS — E118 Type 2 diabetes mellitus with unspecified complications: Secondary | ICD-10-CM | POA: Diagnosis not present

## 2020-11-08 DIAGNOSIS — I609 Nontraumatic subarachnoid hemorrhage, unspecified: Secondary | ICD-10-CM | POA: Diagnosis not present

## 2020-11-08 DIAGNOSIS — N3091 Cystitis, unspecified with hematuria: Secondary | ICD-10-CM | POA: Diagnosis not present

## 2020-11-08 DIAGNOSIS — S12000A Unspecified displaced fracture of first cervical vertebra, initial encounter for closed fracture: Secondary | ICD-10-CM | POA: Diagnosis not present

## 2020-11-08 DIAGNOSIS — K5900 Constipation, unspecified: Secondary | ICD-10-CM | POA: Diagnosis not present

## 2020-11-08 DIAGNOSIS — E876 Hypokalemia: Secondary | ICD-10-CM | POA: Diagnosis not present

## 2020-11-09 LAB — URINE CULTURE
Culture: >=100000 — AB
Special Requests: NORMAL

## 2020-11-11 DIAGNOSIS — K567 Ileus, unspecified: Secondary | ICD-10-CM | POA: Diagnosis not present

## 2020-11-11 DIAGNOSIS — S12000A Unspecified displaced fracture of first cervical vertebra, initial encounter for closed fracture: Secondary | ICD-10-CM | POA: Diagnosis not present

## 2020-11-11 DIAGNOSIS — I609 Nontraumatic subarachnoid hemorrhage, unspecified: Secondary | ICD-10-CM | POA: Diagnosis not present

## 2020-11-11 DIAGNOSIS — E876 Hypokalemia: Secondary | ICD-10-CM | POA: Diagnosis not present

## 2020-11-11 DIAGNOSIS — R000 Tachycardia, unspecified: Secondary | ICD-10-CM | POA: Diagnosis not present

## 2020-11-12 DIAGNOSIS — F329 Major depressive disorder, single episode, unspecified: Secondary | ICD-10-CM | POA: Diagnosis not present

## 2020-11-12 DIAGNOSIS — E1122 Type 2 diabetes mellitus with diabetic chronic kidney disease: Secondary | ICD-10-CM | POA: Diagnosis not present

## 2020-11-12 DIAGNOSIS — I609 Nontraumatic subarachnoid hemorrhage, unspecified: Secondary | ICD-10-CM | POA: Diagnosis not present

## 2020-11-12 DIAGNOSIS — I63523 Cerebral infarction due to unspecified occlusion or stenosis of bilateral anterior cerebral arteries: Secondary | ICD-10-CM | POA: Diagnosis not present

## 2020-11-12 DIAGNOSIS — S12000A Unspecified displaced fracture of first cervical vertebra, initial encounter for closed fracture: Secondary | ICD-10-CM | POA: Diagnosis not present

## 2020-11-12 DIAGNOSIS — K3182 Dieulafoy lesion (hemorrhagic) of stomach and duodenum: Secondary | ICD-10-CM | POA: Diagnosis not present

## 2020-11-12 DIAGNOSIS — I1310 Hypertensive heart and chronic kidney disease without heart failure, with stage 1 through stage 4 chronic kidney disease, or unspecified chronic kidney disease: Secondary | ICD-10-CM | POA: Diagnosis not present

## 2020-11-14 ENCOUNTER — Telehealth: Payer: Self-pay

## 2020-11-14 DIAGNOSIS — M2578 Osteophyte, vertebrae: Secondary | ICD-10-CM | POA: Diagnosis not present

## 2020-11-14 DIAGNOSIS — Z87828 Personal history of other (healed) physical injury and trauma: Secondary | ICD-10-CM | POA: Diagnosis not present

## 2020-11-14 DIAGNOSIS — E876 Hypokalemia: Secondary | ICD-10-CM | POA: Diagnosis not present

## 2020-11-14 DIAGNOSIS — R931 Abnormal findings on diagnostic imaging of heart and coronary circulation: Secondary | ICD-10-CM | POA: Diagnosis not present

## 2020-11-14 DIAGNOSIS — R339 Retention of urine, unspecified: Secondary | ICD-10-CM | POA: Diagnosis not present

## 2020-11-14 DIAGNOSIS — I629 Nontraumatic intracranial hemorrhage, unspecified: Secondary | ICD-10-CM | POA: Diagnosis not present

## 2020-11-14 DIAGNOSIS — M4312 Spondylolisthesis, cervical region: Secondary | ICD-10-CM | POA: Diagnosis not present

## 2020-11-14 DIAGNOSIS — S12000A Unspecified displaced fracture of first cervical vertebra, initial encounter for closed fracture: Secondary | ICD-10-CM | POA: Diagnosis not present

## 2020-11-14 DIAGNOSIS — M47812 Spondylosis without myelopathy or radiculopathy, cervical region: Secondary | ICD-10-CM | POA: Diagnosis not present

## 2020-11-14 DIAGNOSIS — Z8781 Personal history of (healed) traumatic fracture: Secondary | ICD-10-CM | POA: Diagnosis not present

## 2020-11-14 DIAGNOSIS — K567 Ileus, unspecified: Secondary | ICD-10-CM | POA: Diagnosis not present

## 2020-11-14 NOTE — Telephone Encounter (Signed)
Per Dr Silvio Pate: Going to SNF ([facility])---should not have follow up with me until she leaves there.

## 2020-11-18 DIAGNOSIS — K5900 Constipation, unspecified: Secondary | ICD-10-CM | POA: Diagnosis not present

## 2020-11-18 DIAGNOSIS — R000 Tachycardia, unspecified: Secondary | ICD-10-CM | POA: Diagnosis not present

## 2020-11-18 DIAGNOSIS — R339 Retention of urine, unspecified: Secondary | ICD-10-CM | POA: Diagnosis not present

## 2020-11-21 DIAGNOSIS — S12000A Unspecified displaced fracture of first cervical vertebra, initial encounter for closed fracture: Secondary | ICD-10-CM | POA: Diagnosis not present

## 2020-11-21 DIAGNOSIS — R339 Retention of urine, unspecified: Secondary | ICD-10-CM | POA: Diagnosis not present

## 2020-11-21 DIAGNOSIS — R000 Tachycardia, unspecified: Secondary | ICD-10-CM | POA: Diagnosis not present

## 2020-11-21 DIAGNOSIS — K5900 Constipation, unspecified: Secondary | ICD-10-CM | POA: Diagnosis not present

## 2020-11-25 DIAGNOSIS — K5900 Constipation, unspecified: Secondary | ICD-10-CM | POA: Diagnosis not present

## 2020-11-25 DIAGNOSIS — I951 Orthostatic hypotension: Secondary | ICD-10-CM | POA: Diagnosis not present

## 2020-11-25 DIAGNOSIS — S12000A Unspecified displaced fracture of first cervical vertebra, initial encounter for closed fracture: Secondary | ICD-10-CM | POA: Diagnosis not present

## 2020-11-28 DIAGNOSIS — S12000A Unspecified displaced fracture of first cervical vertebra, initial encounter for closed fracture: Secondary | ICD-10-CM | POA: Diagnosis not present

## 2020-11-28 DIAGNOSIS — I951 Orthostatic hypotension: Secondary | ICD-10-CM | POA: Diagnosis not present

## 2020-12-02 DIAGNOSIS — S12000A Unspecified displaced fracture of first cervical vertebra, initial encounter for closed fracture: Secondary | ICD-10-CM | POA: Diagnosis not present

## 2020-12-02 DIAGNOSIS — R000 Tachycardia, unspecified: Secondary | ICD-10-CM | POA: Diagnosis not present

## 2020-12-02 DIAGNOSIS — I951 Orthostatic hypotension: Secondary | ICD-10-CM | POA: Diagnosis not present

## 2020-12-03 ENCOUNTER — Ambulatory Visit: Payer: Medicare PPO | Admitting: Gastroenterology

## 2020-12-03 ENCOUNTER — Other Ambulatory Visit: Payer: Self-pay

## 2020-12-03 DIAGNOSIS — R600 Localized edema: Secondary | ICD-10-CM | POA: Diagnosis not present

## 2020-12-03 DIAGNOSIS — M79672 Pain in left foot: Secondary | ICD-10-CM | POA: Diagnosis not present

## 2020-12-03 DIAGNOSIS — I951 Orthostatic hypotension: Secondary | ICD-10-CM | POA: Diagnosis not present

## 2020-12-05 DIAGNOSIS — N3091 Cystitis, unspecified with hematuria: Secondary | ICD-10-CM | POA: Diagnosis not present

## 2020-12-05 DIAGNOSIS — R339 Retention of urine, unspecified: Secondary | ICD-10-CM | POA: Diagnosis not present

## 2020-12-05 DIAGNOSIS — K5900 Constipation, unspecified: Secondary | ICD-10-CM | POA: Diagnosis not present

## 2020-12-05 DIAGNOSIS — I609 Nontraumatic subarachnoid hemorrhage, unspecified: Secondary | ICD-10-CM | POA: Diagnosis not present

## 2020-12-05 DIAGNOSIS — M79672 Pain in left foot: Secondary | ICD-10-CM | POA: Diagnosis not present

## 2020-12-05 DIAGNOSIS — R000 Tachycardia, unspecified: Secondary | ICD-10-CM | POA: Diagnosis not present

## 2020-12-05 DIAGNOSIS — I951 Orthostatic hypotension: Secondary | ICD-10-CM | POA: Diagnosis not present

## 2020-12-05 DIAGNOSIS — S12000A Unspecified displaced fracture of first cervical vertebra, initial encounter for closed fracture: Secondary | ICD-10-CM | POA: Diagnosis not present

## 2020-12-10 DIAGNOSIS — I619 Nontraumatic intracerebral hemorrhage, unspecified: Secondary | ICD-10-CM | POA: Diagnosis not present

## 2020-12-10 DIAGNOSIS — G479 Sleep disorder, unspecified: Secondary | ICD-10-CM | POA: Diagnosis not present

## 2020-12-10 DIAGNOSIS — S129XXD Fracture of neck, unspecified, subsequent encounter: Secondary | ICD-10-CM | POA: Diagnosis not present

## 2020-12-10 DIAGNOSIS — G629 Polyneuropathy, unspecified: Secondary | ICD-10-CM | POA: Diagnosis not present

## 2020-12-12 ENCOUNTER — Telehealth: Payer: Self-pay | Admitting: Internal Medicine

## 2020-12-12 NOTE — Telephone Encounter (Signed)
Home Health verbal orders Caller Name:Malorie Agency Name: [facility]  Callback number:   Requesting OT/PT/Skilled nursing/Social Work/Speech:  Reason:OT  Frequency:1 wk for 6 wks  Please forward to [facility] pool or providers CMA

## 2020-12-13 NOTE — Telephone Encounter (Signed)
December 12, 2020 Venia Carbon, MD to Pilar Grammes, CMA     4:23 PM That is fine   Malorie OT with [facility] notified as instructed and voiced understanding. Nothing further needed.

## 2020-12-13 NOTE — Telephone Encounter (Signed)
Left v/m requesting cb from [facility].

## 2020-12-17 DIAGNOSIS — R197 Diarrhea, unspecified: Secondary | ICD-10-CM | POA: Diagnosis not present

## 2020-12-20 ENCOUNTER — Telehealth: Payer: Self-pay | Admitting: Internal Medicine

## 2020-12-20 NOTE — Telephone Encounter (Signed)
[name] from [facility] stated pt was in rehab and was placed on metoprolol. Lattie Haw wants to know if pt should not take them anymore or get a refill because she is out of them

## 2020-12-20 NOTE — Telephone Encounter (Signed)
Is she home again?  I don't know why they put her on it---might be best to send in refill for #30 x 0 till we can review things in the office (soon) Find out the dosage obvriously

## 2020-12-23 MED ORDER — METOPROLOL SUCCINATE ER 25 MG PO TB24: 12.5000 mg | ORAL_TABLET | Freq: Every day | ORAL | 0 refills | Status: DC

## 2020-12-23 NOTE — Telephone Encounter (Signed)
Left message on VM for number that was listed in the note. There was no verification the VM was [name]'s. Asked that she call the office.

## 2020-12-23 NOTE — Telephone Encounter (Signed)
Spoke to [place]. Pt is back home. Has an appointment here near the end of the month. She gave me the metoprolol information. Taking it for increased heart rate. Said to send it to [facility]. I have sent it in.

## 2020-12-26 DIAGNOSIS — M47813 Spondylosis without myelopathy or radiculopathy, cervicothoracic region: Secondary | ICD-10-CM | POA: Diagnosis not present

## 2020-12-26 DIAGNOSIS — M4312 Spondylolisthesis, cervical region: Secondary | ICD-10-CM | POA: Diagnosis not present

## 2020-12-26 DIAGNOSIS — Z87828 Personal history of other (healed) physical injury and trauma: Secondary | ICD-10-CM | POA: Diagnosis not present

## 2020-12-26 DIAGNOSIS — Z8781 Personal history of (healed) traumatic fracture: Secondary | ICD-10-CM | POA: Diagnosis not present

## 2020-12-26 DIAGNOSIS — M47812 Spondylosis without myelopathy or radiculopathy, cervical region: Secondary | ICD-10-CM | POA: Diagnosis not present

## 2020-12-26 DIAGNOSIS — I610 Nontraumatic intracerebral hemorrhage in hemisphere, subcortical: Secondary | ICD-10-CM | POA: Diagnosis not present

## 2021-01-08 ENCOUNTER — Ambulatory Visit: Payer: Medicare PPO | Admitting: Internal Medicine

## 2021-01-08 ENCOUNTER — Other Ambulatory Visit: Payer: Self-pay

## 2021-01-08 ENCOUNTER — Encounter: Payer: Self-pay | Admitting: Internal Medicine

## 2021-01-08 ENCOUNTER — Ambulatory Visit: Payer: Medicare PPO | Admitting: Gastroenterology

## 2021-01-08 DIAGNOSIS — I10 Essential (primary) hypertension: Secondary | ICD-10-CM

## 2021-01-08 DIAGNOSIS — E1122 Type 2 diabetes mellitus with diabetic chronic kidney disease: Secondary | ICD-10-CM

## 2021-01-08 DIAGNOSIS — G8194 Hemiplegia, unspecified affecting left nondominant side: Secondary | ICD-10-CM | POA: Diagnosis not present

## 2021-01-08 DIAGNOSIS — N182 Chronic kidney disease, stage 2 (mild): Secondary | ICD-10-CM | POA: Diagnosis not present

## 2021-01-08 DIAGNOSIS — F39 Unspecified mood [affective] disorder: Secondary | ICD-10-CM

## 2021-01-08 DIAGNOSIS — I679 Cerebrovascular disease, unspecified: Secondary | ICD-10-CM | POA: Diagnosis not present

## 2021-01-08 MED ORDER — METOPROLOL SUCCINATE ER 25 MG PO TB24: 12.5000 mg | ORAL_TABLET | Freq: Every day | ORAL | 3 refills | Status: DC

## 2021-01-08 NOTE — Assessment & Plan Note (Signed)
Asked them to check sugars once a week or so On glipizide and metformin

## 2021-01-08 NOTE — Assessment & Plan Note (Signed)
Ongoing anxiety Is on low dose venlafaxine

## 2021-01-08 NOTE — Assessment & Plan Note (Signed)
BP Readings from Last 3 Encounters:  01/08/21 98/60  11/07/20 (!) 150/81  10/27/20 (!) 158/77   Running low Will stop the doxazosin Continue losartan/HCTZ and metoprolol---consider weaning if ongoing dizziness

## 2021-01-08 NOTE — Patient Instructions (Signed)
Please stop the doxazosin completely. Let me know if your blood pressure stays low.

## 2021-01-08 NOTE — Progress Notes (Signed)
Subjective:    Patient ID: [name], female    DOB: Jun 16, 1940, 80 y.o.   MRN: [id number]  HPI Here for follow up after hospitalization and rehab [place]---C1 fracture and hemorrhagic stroke With son [name] This visit occurred during the SARS-CoV-2 public health emergency.  Safety protocols were in place, including screening questions prior to the visit, additional usage of staff PPE, and extensive cleaning of exam room while observing appropriate contact time as indicated for disinfecting solutions.   C1 fracture is healed She wears the hard collar when out only  Hemorrhagic stroke may have been distant---ischemic, then hemorrhagic No sig symptoms from this (transient feeling in May only)  Home from rehab ([facility]) for about 3 weeks Son lives with her [name] does all shopping and housework Walks with walker some--uses wheelchair when out Not able to get into the shower---fear of falling. Sponge bath only Dresses herself and uses bathroom--generally continent  Had problems with left foot--trouble putting weight on it X-ray at [facility] was negative It does swell at times This goes back a month or more No left arm symptoms--but does note some mild left side weakness (is right handed)  Not checking sugars  Continues on the medication--other than the rybelsus (metformin and glipizide)  Rare dizziness Had midodrine from rehab---son only gave her one dose  Current Outpatient Medications on File Prior to Visit  Medication Sig Dispense Refill   Accu-Chek Softclix Lancets lancets Use to obtain blood sugar sample Dx Code E11.40 100 each 3   cholestyramine (QUESTRAN) 4 g packet Take 4 g by mouth daily as needed.     doxazosin (CARDURA) 4 MG tablet TAKE 1/2 TABLET TWICE DAILY 90 tablet 3   gabapentin (NEURONTIN) 100 MG capsule TAKE 1 CAPSULE TWICE DAILY (Patient taking differently: Take 100 mg by mouth daily at 12 noon.) 180 capsule 3   glipiZIDE (GLIPIZIDE XL) 2.5 MG 24 hr tablet  Take 1 tablet (2.5 mg total) by mouth daily with breakfast. 30 tablet 0   glucose blood (ACCU-CHEK GUIDE) test strip Use to check blood sugar once daily Dx Code E11.40 100 each 3   hyoscyamine (LEVSIN) 0.125 MG tablet Take 1 tablet (0.125 mg total) by mouth 3 (three) times daily as needed. Before meals. Don't use imodium also 30 tablet 0   losartan-hydrochlorothiazide (HYZAAR) 100-25 MG tablet Take 1 tablet by mouth daily. 90 tablet 3   metFORMIN (GLUCOPHAGE-XR) 750 MG 24 hr tablet TAKE 2 TABLETS EVERY DAY WITH BREAKFAST 180 tablet 3   metoprolol succinate (TOPROL-XL) 25 MG 24 hr tablet Take 0.5 tablets (12.5 mg total) by mouth daily. 15 tablet 0   Multiple Vitamin (MULTIVITAMIN) tablet Take 1 tablet by mouth daily.     ondansetron (ZOFRAN) 4 MG tablet Take 4 mg by mouth every 8 (eight) hours as needed for nausea or vomiting.     OVER THE COUNTER MEDICATION Knock Out All Natural Sleep Aid     potassium chloride SA (KLOR-CON) 20 MEQ tablet Take 1 tablet (20 mEq total) by mouth 2 (two) times daily for 5 days. 10 tablet 0   saccharomyces boulardii (FLORASTOR) 250 MG capsule Take 1 capsule (250 mg total) by mouth 2 (two) times daily. 60 capsule 0   venlafaxine (EFFEXOR) 37.5 MG tablet TAKE 1 TABLET TWICE DAILY (Patient taking differently: Take 37.5 mg by mouth daily at 12 noon.) 180 tablet 3   polyethylene glycol (MIRALAX / GLYCOLAX) 17 g packet Take 17 g by mouth daily. (Patient not  taking: Reported on 01/08/2021) 30 each 0   No current facility-administered medications on file prior to visit.    Allergies  Allergen Reactions   Diphenhydramine Hcl Other (See Comments)    hallucinations    Fluoxetine Hcl     REACTION: raised blood pressure   Lisinopril     REACTION: dizzy   Sertraline Hcl     REACTION: dizziness    Past Medical History:  Diagnosis Date   Allergic rhinitis    Anal fissure    Arthritis    Asthma    Depression    Diabetes mellitus    type 2   Diverticulosis     Fibromyalgia    GERD (gastroesophageal reflux disease)    Hypertension    Obesity    Osteoarthritis     Past Surgical History:  Procedure Laterality Date   ABDOMINAL HYSTERECTOMY  1970's   CATARACT EXTRACTION, BILATERAL     CHOLECYSTECTOMY  4/06   TUBAL LIGATION  1970's   VENTRAL HERNIA REPAIR  9/08   [facility]    Family History  Problem Relation Age of Onset   Heart attack Father    Coronary artery disease Father    Kidney cancer Mother    Lung cancer Brother    Liver cancer Brother    Diabetes Other        Maternal side   Brain cancer Daughter    Hypertension Neg Hx    Breast cancer Neg Hx    Colon cancer Neg Hx     Social History   Socioeconomic History   Marital status: Divorced    Spouse name: Not on file   Number of children: 3   Years of education: Not on file   Highest education level: Not on file  Occupational History   Occupation: Scientist, research (physical sciences): RETIRED  Tobacco Use   Smoking status: Never   Smokeless tobacco: Never  Vaping Use   Vaping Use: Never used  Substance and Sexual Activity   Alcohol use: No    Alcohol/week: 0.0 standard drinks   Drug use: No   Sexual activity: Not on file  Other Topics Concern   Not on file  Social History Narrative   No living will   No health care POA but would want son, [name], to make decisions about her care if needed. Other child are alternatives   Would accept resuscitation attempts   Probably wouldn't want tube feeds if cognitively unaware   Social Determinants of Health   Financial Resource Strain: Not on file  Food Insecurity: Not on file  Transportation Needs: Not on file  Physical Activity: Not on file  Stress: Not on file  Social Connections: Not on file  Intimate Partner Violence: Not on file   Review of Systems UTI while at [facility]--no symptoms now Not sleeping well---chronic Bowels still frequent---using the cholestyramine intermittently due to it causing constipation    Objective:    Physical Exam Constitutional:      Appearance: She is well-developed.     Comments: In wheelchair  Neck:     Comments: In hard collar Cardiovascular:     Rate and Rhythm: Normal rate and regular rhythm.     Heart sounds: No murmur heard.   No gallop.  Pulmonary:     Effort: Pulmonary effort is normal.     Breath sounds: Normal breath sounds. No wheezing or rales.  Musculoskeletal:     Right lower leg: No edema.  Left lower leg: No edema.     Comments: No left ankle/foot swelling or tenderness  Neurological:     Mental Status: She is alert.  Psychiatric:     Comments: Mild anxiety           Assessment & Plan:

## 2021-01-08 NOTE — Assessment & Plan Note (Signed)
Has mild left side weakness Left foot pain may be from the stroke as well Is getting PT now

## 2021-01-10 ENCOUNTER — Telehealth: Payer: Self-pay | Admitting: Internal Medicine

## 2021-01-10 NOTE — Telephone Encounter (Signed)
Left orders on verified vm.

## 2021-01-10 NOTE — Telephone Encounter (Signed)
Home Health verbal orders [place] Name:Connie Agency Name: [place] number: [phone]  Requesting OT/PT/Skilled nursing/Social Work/Speech:  Reason:OT // and also Home Health for Bathing  Frequency:1 wk for 3 wks  Please forward to [facility] pool or providers CMA

## 2021-01-23 DIAGNOSIS — R350 Frequency of micturition: Secondary | ICD-10-CM | POA: Diagnosis not present

## 2021-01-23 DIAGNOSIS — N1832 Chronic kidney disease, stage 3b: Secondary | ICD-10-CM | POA: Diagnosis not present

## 2021-01-23 DIAGNOSIS — I10 Essential (primary) hypertension: Secondary | ICD-10-CM | POA: Diagnosis not present

## 2021-01-24 ENCOUNTER — Telehealth: Payer: Self-pay | Admitting: Internal Medicine

## 2021-01-24 NOTE — Telephone Encounter (Signed)
Noted! Thank you

## 2021-01-24 NOTE — Telephone Encounter (Signed)
Melissa from [place] home health stated pt missed occuaptional therapy appt

## 2021-02-04 DIAGNOSIS — K591 Functional diarrhea: Secondary | ICD-10-CM | POA: Diagnosis not present

## 2021-02-05 ENCOUNTER — Ambulatory Visit: Payer: Medicare PPO | Admitting: Gastroenterology

## 2021-02-07 ENCOUNTER — Telehealth: Payer: Self-pay | Admitting: Internal Medicine

## 2021-02-07 NOTE — Telephone Encounter (Signed)
Home Health verbal orders [place] Name:[name] Agency Name: [place] number: [phone]  Requesting OT/PT/Skilled nursing/Social Work/Speech:  Reason:  Frequency:OT 1 wk for 6 wks  [facility]  1 wk for 6 wks  Please forward to [facility] pool or providers CMA

## 2021-02-07 NOTE — Telephone Encounter (Signed)
Spoke to [place]. Gave verbal orders

## 2021-03-04 DIAGNOSIS — M79642 Pain in left hand: Secondary | ICD-10-CM | POA: Diagnosis not present

## 2021-03-04 DIAGNOSIS — M7989 Other specified soft tissue disorders: Secondary | ICD-10-CM | POA: Diagnosis not present

## 2021-03-10 ENCOUNTER — Ambulatory Visit: Payer: Medicare PPO | Admitting: Internal Medicine

## 2021-03-18 ENCOUNTER — Ambulatory Visit: Payer: Medicare PPO | Admitting: Internal Medicine

## 2021-04-02 ENCOUNTER — Other Ambulatory Visit: Payer: Self-pay | Admitting: Neurology

## 2021-04-02 DIAGNOSIS — I639 Cerebral infarction, unspecified: Secondary | ICD-10-CM

## 2021-04-03 ENCOUNTER — Other Ambulatory Visit: Payer: Self-pay

## 2021-04-03 ENCOUNTER — Encounter: Payer: Self-pay | Admitting: Internal Medicine

## 2021-04-03 ENCOUNTER — Ambulatory Visit (INDEPENDENT_AMBULATORY_CARE_PROVIDER_SITE_OTHER): Payer: Medicare PPO | Admitting: Internal Medicine

## 2021-04-03 DIAGNOSIS — N182 Chronic kidney disease, stage 2 (mild): Secondary | ICD-10-CM | POA: Diagnosis not present

## 2021-04-03 DIAGNOSIS — R197 Diarrhea, unspecified: Secondary | ICD-10-CM

## 2021-04-03 DIAGNOSIS — G8194 Hemiplegia, unspecified affecting left nondominant side: Secondary | ICD-10-CM

## 2021-04-03 DIAGNOSIS — I679 Cerebrovascular disease, unspecified: Secondary | ICD-10-CM | POA: Diagnosis not present

## 2021-04-03 DIAGNOSIS — N1831 Chronic kidney disease, stage 3a: Secondary | ICD-10-CM | POA: Diagnosis not present

## 2021-04-03 DIAGNOSIS — E1122 Type 2 diabetes mellitus with diabetic chronic kidney disease: Secondary | ICD-10-CM

## 2021-04-03 DIAGNOSIS — F39 Unspecified mood [affective] disorder: Secondary | ICD-10-CM | POA: Diagnosis not present

## 2021-04-03 LAB — POCT GLYCOSYLATED HEMOGLOBIN (HGB A1C): Hemoglobin A1C: 6.2 % — AB (ref 4.0–5.6)

## 2021-04-03 MED ORDER — CHOLESTYRAMINE 4 G PO PACK: 4.0000 g | PACK | Freq: Every day | ORAL | 3 refills | Status: AC

## 2021-04-03 MED ORDER — GLIPIZIDE ER 2.5 MG PO TB24: 2.5000 mg | ORAL_TABLET | Freq: Two times a day (BID) | ORAL | 3 refills | Status: DC

## 2021-04-03 MED ORDER — VENLAFAXINE HCL 37.5 MG PO TABS: 37.5000 mg | ORAL_TABLET | Freq: Every day | ORAL | 0 refills | Status: DC

## 2021-04-03 NOTE — Assessment & Plan Note (Signed)
Subtle weakness---but still with functional limitations Son does most of the work in house Aide for bathing Still getting PT

## 2021-04-03 NOTE — Assessment & Plan Note (Signed)
Mood is okay Has adjusted and son helps Is on the venlafaxine 37.5 daily---will continue

## 2021-04-03 NOTE — Progress Notes (Signed)
Subjective:    Patient ID: [name], female    DOB: Jun 13, 1940, 81 y.o.   MRN: [id number]  HPI Here for follow up after stroke--and for other chronic medical conditions With son [name] again  Things are "so-so" Son still lives with her---he does all housework and shopping She dresses herself--has aide weekly to help with bathing Walks with walker in house Uses bathroom---goes a lot but continent (for the most part)  Not really checking sugars Eats what she wants--but limits sugar and fried food Weight is stable  Not depressed Will go out to [place] with son--uses cart  No chest pain No SOB Some dizziness when sits up----some headaches when lying flat Neurologist plans repeat brain MRI to be sure no recurrent bleed Still with numbness on left--but not much weakness  Last GFR 53 Current Outpatient Medications on File Prior to Visit  Medication Sig Dispense Refill   Accu-Chek Softclix Lancets lancets Use to obtain blood sugar sample Dx Code E11.40 100 each 3   cholestyramine (QUESTRAN) 4 g packet Take 4 g by mouth daily as needed.     gabapentin (NEURONTIN) 100 MG capsule TAKE 1 CAPSULE TWICE DAILY (Patient taking differently: Take 100 mg by mouth daily at 12 noon.) 180 capsule 3   glipiZIDE (GLIPIZIDE XL) 2.5 MG 24 hr tablet Take 1 tablet (2.5 mg total) by mouth daily with breakfast. 30 tablet 0   glucose blood (ACCU-CHEK GUIDE) test strip Use to check blood sugar once daily Dx Code E11.40 100 each 3   hyoscyamine (LEVSIN) 0.125 MG tablet Take 1 tablet (0.125 mg total) by mouth 3 (three) times daily as needed. Before meals. Don't use imodium also 30 tablet 0   losartan-hydrochlorothiazide (HYZAAR) 100-25 MG tablet Take 1 tablet by mouth daily. 90 tablet 3   metFORMIN (GLUCOPHAGE-XR) 750 MG 24 hr tablet TAKE 2 TABLETS EVERY DAY WITH BREAKFAST 180 tablet 3   metoprolol succinate (TOPROL-XL) 25 MG 24 hr tablet Take 0.5 tablets (12.5 mg total) by mouth daily. 45 tablet 3    Multiple Vitamin (MULTIVITAMIN) tablet Take 1 tablet by mouth daily.     saccharomyces boulardii (FLORASTOR) 250 MG capsule Take 1 capsule (250 mg total) by mouth 2 (two) times daily. 60 capsule 0   venlafaxine (EFFEXOR) 37.5 MG tablet TAKE 1 TABLET TWICE DAILY (Patient taking differently: Take 37.5 mg by mouth daily at 12 noon.) 180 tablet 3   No current facility-administered medications on file prior to visit.    Allergies  Allergen Reactions   Diphenhydramine Hcl Other (See Comments)    hallucinations    Fluoxetine Hcl     REACTION: raised blood pressure   Lisinopril     REACTION: dizzy   Sertraline Hcl     REACTION: dizziness    Past Medical History:  Diagnosis Date   Allergic rhinitis    Anal fissure    Arthritis    Asthma    Depression    Diabetes mellitus    type 2   Diverticulosis    Fibromyalgia    GERD (gastroesophageal reflux disease)    Hypertension    Obesity    Osteoarthritis     Past Surgical History:  Procedure Laterality Date   ABDOMINAL HYSTERECTOMY  1970's   CATARACT EXTRACTION, BILATERAL     CHOLECYSTECTOMY  4/06   TUBAL LIGATION  1970's   VENTRAL HERNIA REPAIR  9/08   [place]    Family History  Problem Relation Age of Onset  Heart attack Father    Coronary artery disease Father    Kidney cancer Mother    Lung cancer Brother    Liver cancer Brother    Diabetes Other        Maternal side   Brain cancer Daughter    Hypertension Neg Hx    Breast cancer Neg Hx    Colon cancer Neg Hx     Social History   Socioeconomic History   Marital status: Divorced    Spouse name: Not on file   Number of children: 3   Years of education: Not on file   Highest education level: Not on file  Occupational History   Occupation: Homemaker    Employer: RETIRED  Tobacco Use   Smoking status: Never    Passive exposure: Past   Smokeless tobacco: Never  Vaping Use   Vaping Use: Never used  Substance and Sexual Activity   Alcohol use: No     Alcohol/week: 0.0 standard drinks   Drug use: Not on file   Sexual activity: Not on file  Other Topics Concern   Not on file  Social History Narrative   No living will   No health care POA but would want son, [name], to make decisions about her care if needed. Other child are alternatives   Would accept resuscitation attempts   Probably wouldn't want tube feeds if cognitively unaware   Social Determinants of Health   Financial Resource Strain: Not on file  Food Insecurity: Not on file  Transportation Needs: Not on file  Physical Activity: Not on file  Stress: Not on file  Social Connections: Not on file  Intimate Partner Violence: Not on file   Review of Systems Doesn't sleep well---sleeps 2 hours, then up, then sleeps again (all day long) Not exercising--but still has therapy twice a week Still has some neck pain Loose stools controlled with cholestyramine    Objective:   Physical Exam Cardiovascular:     Rate and Rhythm: Normal rate and regular rhythm.     Pulses: Normal pulses.     Heart sounds: No murmur heard.   No gallop.  Pulmonary:     Effort: Pulmonary effort is normal.     Breath sounds: Normal breath sounds. No wheezing or rales.  Abdominal:     Palpations: Abdomen is soft.     Tenderness: There is no abdominal tenderness.  Musculoskeletal:     Right lower leg: No edema.     Left lower leg: No edema.  Neurological:     Comments: Slight weakness on left side--arm > leg           Assessment & Plan:

## 2021-04-03 NOTE — Assessment & Plan Note (Signed)
Actually has improved Is on the losartan 100mg 

## 2021-04-03 NOTE — Assessment & Plan Note (Signed)
Better with cholestyramine 4gm daily

## 2021-04-03 NOTE — Assessment & Plan Note (Signed)
Aic down to 6.2% Will continue metformin 1500 in AM, glipizide 2.5 bid

## 2021-04-10 ENCOUNTER — Telehealth: Payer: Self-pay | Admitting: Internal Medicine

## 2021-04-10 NOTE — Telephone Encounter (Signed)
Home Health verbal orders ?Caller Name:Connie ?Agency Name: [facility] ? ?Callback number: [phone] ? ?Requesting OT/PT/Skilled nursing/Social Work/Speech: ? ?Reason:OT  1 wk for 6 wks      Home Health Aid 1 wk for 4 wks ? ?Please forward to [facility] pool or providers CMA  ?

## 2021-04-10 NOTE — Telephone Encounter (Signed)
Connie with [facility] notified as instructed and voiced understanding.  ?

## 2021-04-12 ENCOUNTER — Other Ambulatory Visit: Payer: Self-pay | Admitting: Internal Medicine

## 2021-04-14 ENCOUNTER — Telehealth: Payer: Self-pay | Admitting: Internal Medicine

## 2021-04-14 NOTE — Telephone Encounter (Signed)
Home Health verbal orders ?Caller Name:Pamela  ?Agency Name: [place] ? ?Callback number: [phone] ? ?Requesting OT/PT/Skilled nursing/Social Work/Speech: ? ?Reason:Skilled Nursing ? ?Frequency:1wk for 1wk 2wks for 1wk 1wk for 2wks 67month for 1wk 2PRN as needed ? ?[facility]  ?

## 2021-04-14 NOTE — Telephone Encounter (Signed)
Left detailed message for [name] ok for verbal orders. ?

## 2021-04-14 NOTE — Telephone Encounter (Signed)
Refill Gabapentin ?Last refill 12/12/19 #180/3 ?Last office visit 04/03/21 ?

## 2021-04-17 ENCOUNTER — Ambulatory Visit
Admission: RE | Admit: 2021-04-17 | Discharge: 2021-04-17 | Disposition: A | Discharge status: Home / Self Care | Payer: Medicare PPO | Source: Ambulatory Visit | Attending: Neurology | Admitting: Neurology

## 2021-04-17 DIAGNOSIS — I639 Cerebral infarction, unspecified: Secondary | ICD-10-CM | POA: Diagnosis not present

## 2021-04-17 DIAGNOSIS — R413 Other amnesia: Secondary | ICD-10-CM | POA: Diagnosis not present

## 2021-04-17 DIAGNOSIS — R519 Headache, unspecified: Secondary | ICD-10-CM | POA: Diagnosis not present

## 2021-04-18 ENCOUNTER — Telehealth: Payer: Self-pay

## 2021-04-18 NOTE — Telephone Encounter (Signed)
Noted. They will fax a missed appt notice. ?

## 2021-04-18 NOTE — Telephone Encounter (Signed)
[facility] called to let you know that patient is declining PT today. Next scheduled date it 04/22/2021 ?

## 2021-05-12 ENCOUNTER — Encounter: Payer: Medicare PPO | Admitting: Internal Medicine

## 2021-05-27 DIAGNOSIS — N1832 Chronic kidney disease, stage 3b: Secondary | ICD-10-CM | POA: Diagnosis not present

## 2021-05-27 DIAGNOSIS — R350 Frequency of micturition: Secondary | ICD-10-CM | POA: Diagnosis not present

## 2021-05-27 DIAGNOSIS — N200 Calculus of kidney: Secondary | ICD-10-CM | POA: Diagnosis not present

## 2021-06-03 DIAGNOSIS — K529 Noninfective gastroenteritis and colitis, unspecified: Secondary | ICD-10-CM | POA: Diagnosis not present

## 2021-06-03 DIAGNOSIS — M6289 Other specified disorders of muscle: Secondary | ICD-10-CM | POA: Diagnosis not present

## 2021-06-05 ENCOUNTER — Telehealth: Payer: Self-pay

## 2021-06-05 NOTE — Telephone Encounter (Signed)
Spoke to pt. She will think about what she wants to do. Will make OV if she wants to get documentation. ?

## 2021-06-05 NOTE — Telephone Encounter (Signed)
I spoke to the pt. She said she still cannot go up steps. Has to use a wheelchair to get out of the house. PT said they were going to work with her next week on steps. ?

## 2021-06-05 NOTE — Telephone Encounter (Signed)
Amy called from insurance. They were not able to approve the extension on home health services. Wanted to know If Dr. Lubertha Sayres would ike to do peer to peer to see if they can get approved. Will need to call no later than tomorrow at 12 pm in order to set up time for call.  ? ?[phone] opt 1  ?

## 2021-06-17 ENCOUNTER — Other Ambulatory Visit: Payer: Self-pay | Admitting: Internal Medicine

## 2021-06-18 NOTE — Telephone Encounter (Signed)
Left message on verified VM with note from Dr Silvio Pate. ?

## 2021-06-18 NOTE — Telephone Encounter (Signed)
Please let her know that I refilled this---but I changed it to 1/2-1 as needed. ?She should only take this if she is actually having muscle spasms--not every night. ?

## 2021-07-11 ENCOUNTER — Other Ambulatory Visit: Payer: Self-pay | Admitting: Internal Medicine

## 2021-07-22 DIAGNOSIS — I10 Essential (primary) hypertension: Secondary | ICD-10-CM | POA: Diagnosis not present

## 2021-07-22 DIAGNOSIS — Z8639 Personal history of other endocrine, nutritional and metabolic disease: Secondary | ICD-10-CM | POA: Diagnosis not present

## 2021-07-22 DIAGNOSIS — N1832 Chronic kidney disease, stage 3b: Secondary | ICD-10-CM | POA: Diagnosis not present

## 2021-07-24 ENCOUNTER — Telehealth: Payer: Self-pay | Admitting: Internal Medicine

## 2021-07-24 DIAGNOSIS — E118 Type 2 diabetes mellitus with unspecified complications: Secondary | ICD-10-CM | POA: Diagnosis not present

## 2021-07-24 DIAGNOSIS — I10 Essential (primary) hypertension: Secondary | ICD-10-CM | POA: Diagnosis not present

## 2021-07-24 DIAGNOSIS — N1832 Chronic kidney disease, stage 3b: Secondary | ICD-10-CM | POA: Diagnosis not present

## 2021-07-24 MED ORDER — GLIPIZIDE ER 2.5 MG PO TB24: 2.5000 mg | ORAL_TABLET | Freq: Two times a day (BID) | ORAL | 3 refills | Status: DC

## 2021-07-24 NOTE — Telephone Encounter (Signed)
Refill sent as requested. 

## 2021-07-24 NOTE — Telephone Encounter (Signed)
Caller Name: [place] Call back phone #: [phone]  MEDICATION(S): Glipizide   Days of Med Remaining: not known  Has the patient contacted their pharmacy (YES/NO)?  Pharmacy calling IF YES, when and what did the pharmacy advise?  IF NO, request that the patient contact the pharmacy for the refills in the future.             The pharmacy will send an electronic request (except for controlled medications).  Preferred Pharmacy: [facility]  ~~~Please advise patient/caregiver to allow 2-3 business days to process RX refills.

## 2021-08-04 ENCOUNTER — Ambulatory Visit (INDEPENDENT_AMBULATORY_CARE_PROVIDER_SITE_OTHER): Payer: Medicare PPO | Admitting: Internal Medicine

## 2021-08-04 ENCOUNTER — Encounter: Payer: Self-pay | Admitting: Internal Medicine

## 2021-08-04 VITALS — BP 140/90 | HR 89 | Temp 97.9°F | Ht 64.0 in | Wt 182.0 lb

## 2021-08-04 DIAGNOSIS — F39 Unspecified mood [affective] disorder: Secondary | ICD-10-CM | POA: Diagnosis not present

## 2021-08-04 DIAGNOSIS — I10 Essential (primary) hypertension: Secondary | ICD-10-CM | POA: Diagnosis not present

## 2021-08-04 DIAGNOSIS — N1832 Chronic kidney disease, stage 3b: Secondary | ICD-10-CM

## 2021-08-04 DIAGNOSIS — Z0000 Encounter for general adult medical examination without abnormal findings: Secondary | ICD-10-CM | POA: Diagnosis not present

## 2021-08-04 DIAGNOSIS — G8194 Hemiplegia, unspecified affecting left nondominant side: Secondary | ICD-10-CM

## 2021-08-04 DIAGNOSIS — N182 Chronic kidney disease, stage 2 (mild): Secondary | ICD-10-CM

## 2021-08-04 DIAGNOSIS — I679 Cerebrovascular disease, unspecified: Secondary | ICD-10-CM | POA: Diagnosis not present

## 2021-08-04 DIAGNOSIS — E1122 Type 2 diabetes mellitus with diabetic chronic kidney disease: Secondary | ICD-10-CM | POA: Diagnosis not present

## 2021-08-04 NOTE — Assessment & Plan Note (Signed)
Stays in bed all day---but not clear depressed Discussed doing more Will continue just the venlafaxine 37.5mg  daily

## 2021-09-02 DIAGNOSIS — K529 Noninfective gastroenteritis and colitis, unspecified: Secondary | ICD-10-CM | POA: Diagnosis not present

## 2021-12-03 ENCOUNTER — Other Ambulatory Visit: Payer: Self-pay | Admitting: Internal Medicine

## 2022-02-06 ENCOUNTER — Ambulatory Visit (INDEPENDENT_AMBULATORY_CARE_PROVIDER_SITE_OTHER): Payer: Medicare PPO | Admitting: Internal Medicine

## 2022-02-06 ENCOUNTER — Encounter: Payer: Self-pay | Admitting: Internal Medicine

## 2022-02-06 VITALS — BP 136/88 | HR 86 | Temp 97.0°F | Ht 64.0 in | Wt 181.0 lb

## 2022-02-06 DIAGNOSIS — E1122 Type 2 diabetes mellitus with diabetic chronic kidney disease: Secondary | ICD-10-CM | POA: Diagnosis not present

## 2022-02-06 DIAGNOSIS — L97921 Non-pressure chronic ulcer of unspecified part of left lower leg limited to breakdown of skin: Secondary | ICD-10-CM | POA: Diagnosis not present

## 2022-02-06 DIAGNOSIS — N182 Chronic kidney disease, stage 2 (mild): Secondary | ICD-10-CM

## 2022-02-06 DIAGNOSIS — F39 Unspecified mood [affective] disorder: Secondary | ICD-10-CM

## 2022-02-06 DIAGNOSIS — Z23 Encounter for immunization: Secondary | ICD-10-CM

## 2022-02-06 DIAGNOSIS — N1832 Chronic kidney disease, stage 3b: Secondary | ICD-10-CM

## 2022-02-06 DIAGNOSIS — L97929 Non-pressure chronic ulcer of unspecified part of left lower leg with unspecified severity: Secondary | ICD-10-CM | POA: Insufficient documentation

## 2022-02-06 LAB — RENAL FUNCTION PANEL
Albumin: 3.8 g/dL (ref 3.5–5.2)
BUN: 27 mg/dL — ABNORMAL HIGH (ref 6–23)
CO2: 25 mEq/L (ref 19–32)
Calcium: 9.2 mg/dL (ref 8.4–10.5)
Chloride: 98 mEq/L (ref 96–112)
Creatinine, Ser: 1.5 mg/dL — ABNORMAL HIGH (ref 0.40–1.20)
GFR: 32.72 mL/min — ABNORMAL LOW (ref >60.00)
Glucose, Bld: 244 mg/dL — ABNORMAL HIGH (ref 70–99)
Phosphorus: 3.3 mg/dL (ref 2.3–4.6)
Potassium: 3.8 mEq/L (ref 3.5–5.1)
Sodium: 134 mEq/L — ABNORMAL LOW (ref 135–145)

## 2022-02-06 LAB — CBC
HCT: 29.9 % — ABNORMAL LOW (ref 36.0–46.0)
Hemoglobin: 10.3 g/dL — ABNORMAL LOW (ref 12.0–15.0)
MCHC: 34.5 g/dL (ref 30.0–36.0)
MCV: 95.2 fl (ref 78.0–100.0)
Platelets: 217 10*3/uL (ref 150.0–400.0)
RBC: 3.14 Mil/uL — ABNORMAL LOW (ref 3.87–5.11)
RDW: 14.2 % (ref 11.5–15.5)
WBC: 8.2 10*3/uL (ref 4.0–10.5)

## 2022-02-06 LAB — POCT GLYCOSYLATED HEMOGLOBIN (HGB A1C): Hemoglobin A1C: 6.5 % — AB (ref 4.0–5.6)

## 2022-02-06 NOTE — Assessment & Plan Note (Signed)
Lab Results  Component Value Date   HGBA1C 6.5 (A) 02/06/2022   Still excellent control  On the glipizide 2.'5mg'$  and metformin '1500mg'$  daily

## 2022-02-06 NOTE — Assessment & Plan Note (Signed)
Superficial and granulated again now Discussed using a wrap in it to protect it No infection

## 2022-02-06 NOTE — Progress Notes (Signed)
Subjective:    Patient ID: [name], female    DOB: December 12, 1940, 81 y.o.   MRN: [id number]  HPI Here for follow up of diabetes and other chronic health conditions With son  Doing fair Uses wheelchair when out--walker in the house Son takes care of all housework, etc  Not checking sugars Recurrent ulcer on left calf---gets scab but then gushes blood if that is knocked off Some foot pain  Mood is "terrible" CHristmas was tough Son notes she creates a "massive amount of stress at Christmas"  No chest pain or SOB  Has stopped with the nephrologist Last GFR was 40  Current Outpatient Medications on File Prior to Visit  Medication Sig Dispense Refill   Accu-Chek Softclix Lancets lancets Use to obtain blood sugar sample Dx Code E11.40 100 each 3   cholestyramine (QUESTRAN) 4 g packet Take 1 packet (4 g total) by mouth daily. 90 each 3   glipiZIDE (GLIPIZIDE XL) 2.5 MG 24 hr tablet Take 1 tablet (2.5 mg total) by mouth in the morning and at bedtime. 180 tablet 3   glucose blood (ACCU-CHEK GUIDE) test strip Use to check blood sugar once daily Dx Code E11.40 100 each 3   hyoscyamine (LEVSIN) 0.125 MG tablet Take 1 tablet (0.125 mg total) by mouth 3 (three) times daily as needed. Before meals. Don't use imodium also 30 tablet 0   losartan-hydrochlorothiazide (HYZAAR) 100-25 MG tablet TAKE 1 TABLET EVERY DAY 90 tablet 3   metFORMIN (GLUCOPHAGE-XR) 750 MG 24 hr tablet TAKE 2 TABLETS EVERY DAY WITH BREAKFAST 180 tablet 3   metoprolol succinate (TOPROL-XL) 25 MG 24 hr tablet TAKE 1/2 TABLET EVERY DAY 45 tablet 3   Multiple Vitamin (MULTIVITAMIN) tablet Take 1 tablet by mouth daily.     venlafaxine (EFFEXOR) 37.5 MG tablet Take 1 tablet (37.5 mg total) by mouth daily at 12 noon. 1 tablet 0   No current facility-administered medications on file prior to visit.    Allergies  Allergen Reactions   Diphenhydramine Hcl Other (See Comments)    hallucinations    Fluoxetine Hcl      REACTION: raised blood pressure   Lisinopril     REACTION: dizzy   Sertraline Hcl     REACTION: dizziness    Past Medical History:  Diagnosis Date   Allergic rhinitis    Anal fissure    Arthritis    Asthma    Depression    Diabetes mellitus    type 2   Diverticulosis    Fibromyalgia    GERD (gastroesophageal reflux disease)    Hypertension    Obesity    Osteoarthritis     Past Surgical History:  Procedure Laterality Date   ABDOMINAL HYSTERECTOMY  1970's   CATARACT EXTRACTION, BILATERAL     CHOLECYSTECTOMY  4/06   TUBAL LIGATION  1970's   VENTRAL HERNIA REPAIR  9/08   [facility]    Family History  Problem Relation Age of Onset   Heart attack Father    Coronary artery disease Father    Kidney cancer Mother    Lung cancer Brother    Liver cancer Brother    Diabetes Other        Maternal side   Brain cancer Daughter    Hypertension Neg Hx    Breast cancer Neg Hx    Colon cancer Neg Hx     Social History   Socioeconomic History   Marital status: Divorced    Spouse name:  Not on file   Number of children: 3   Years of education: Not on file   Highest education level: Not on file  Occupational History   Occupation: Homemaker    Employer: RETIRED  Tobacco Use   Smoking status: Never    Passive exposure: Past   Smokeless tobacco: Never  Vaping Use   Vaping Use: Never used  Substance and Sexual Activity   Alcohol use: No    Alcohol/week: 0.0 standard drinks of alcohol   Drug use: Not on file   Sexual activity: Not on file  Other Topics Concern   Not on file  Social History Narrative   No living will   No health care POA but would want son, [name], to make decisions about her care if needed. Other child are alternatives   Would accept resuscitation attempts   Probably wouldn't want tube feeds if cognitively unaware   Social Determinants of Health   Financial Resource Strain: Not on file  Food Insecurity: Not on file  Transportation Needs: Not on  file  Physical Activity: Not on file  Stress: Not on file  Social Connections: Not on file  Intimate Partner Violence: Not on file   Review of Systems Eating is variable---good one day, diarrhea the next. Cholestyramine helps some (uses prn) Weight is stable Not sleeping well--never has    Objective:   Physical Exam Constitutional:      Appearance: Normal appearance.  Cardiovascular:     Rate and Rhythm: Normal rate and regular rhythm.     Pulses: Normal pulses.     Heart sounds: No murmur heard.    No gallop.  Pulmonary:     Effort: Pulmonary effort is normal.     Breath sounds: Normal breath sounds. No wheezing or rales.  Musculoskeletal:     Right lower leg: No edema.     Left lower leg: No edema.  Skin:    Comments: Superficial ulcer on left anterior calf Slightly raised suspicious lesions medial to that (but no recent change so will just observe)  Neurological:     Mental Status: She is alert.            Assessment & Plan:

## 2022-02-06 NOTE — Assessment & Plan Note (Signed)
On losartan  Will recheck labs

## 2022-02-06 NOTE — Assessment & Plan Note (Signed)
Had a tough time at Christmas--but nothing serious Continues on the venlafaxine 37.'5mg'$  daily

## 2022-06-19 ENCOUNTER — Other Ambulatory Visit: Payer: Self-pay | Admitting: Internal Medicine

## 2022-06-30 ENCOUNTER — Telehealth: Payer: Self-pay

## 2022-06-30 MED ORDER — VENLAFAXINE HCL 37.5 MG PO TABS: 37.5000 mg | ORAL_TABLET | Freq: Every day | ORAL | 1 refills | Status: DC

## 2022-06-30 NOTE — Telephone Encounter (Signed)
Rx sent electronically.  

## 2022-07-24 ENCOUNTER — Telehealth: Payer: Self-pay | Admitting: Internal Medicine

## 2022-07-24 MED ORDER — VENLAFAXINE HCL 37.5 MG PO TABS: 37.5000 mg | ORAL_TABLET | Freq: Every day | ORAL | 1 refills | Status: DC

## 2022-07-24 NOTE — Telephone Encounter (Signed)
Prescription Request  07/24/2022  LOV: 02/06/2022  What is the name of the medication or equipment?  venlafaxine (EFFEXOR) 37.5 MG tablet  Have you contacted your pharmacy to request a refill? Yes   Which pharmacy would you like this sent to?  [facility] - [place], [place] - [address] [place] [address] Phone: [phone] Fax: [phone]     Patient notified that their request is being sent to the clinical staff for review and that they should receive a response within 2 business days.   Please advise at [facility] [phone]

## 2022-07-24 NOTE — Telephone Encounter (Signed)
Rx sent electronically.  

## 2022-08-17 ENCOUNTER — Ambulatory Visit (INDEPENDENT_AMBULATORY_CARE_PROVIDER_SITE_OTHER): Payer: Medicare PPO | Admitting: Internal Medicine

## 2022-08-17 ENCOUNTER — Encounter: Payer: Self-pay | Admitting: Internal Medicine

## 2022-08-17 VITALS — BP 130/84 | HR 89 | Temp 98.0°F | Ht 64.0 in | Wt 179.0 lb

## 2022-08-17 DIAGNOSIS — G8194 Hemiplegia, unspecified affecting left nondominant side: Secondary | ICD-10-CM

## 2022-08-17 DIAGNOSIS — E1122 Type 2 diabetes mellitus with diabetic chronic kidney disease: Secondary | ICD-10-CM | POA: Diagnosis not present

## 2022-08-17 DIAGNOSIS — I10 Essential (primary) hypertension: Secondary | ICD-10-CM | POA: Diagnosis not present

## 2022-08-17 DIAGNOSIS — I679 Cerebrovascular disease, unspecified: Secondary | ICD-10-CM | POA: Diagnosis not present

## 2022-08-17 DIAGNOSIS — N182 Chronic kidney disease, stage 2 (mild): Secondary | ICD-10-CM

## 2022-08-17 DIAGNOSIS — I4891 Unspecified atrial fibrillation: Secondary | ICD-10-CM

## 2022-08-17 DIAGNOSIS — L570 Actinic keratosis: Secondary | ICD-10-CM

## 2022-08-17 DIAGNOSIS — N1832 Chronic kidney disease, stage 3b: Secondary | ICD-10-CM | POA: Diagnosis not present

## 2022-08-17 DIAGNOSIS — I499 Cardiac arrhythmia, unspecified: Secondary | ICD-10-CM

## 2022-08-17 DIAGNOSIS — F39 Unspecified mood [affective] disorder: Secondary | ICD-10-CM

## 2022-08-17 LAB — POCT GLYCOSYLATED HEMOGLOBIN (HGB A1C): Hemoglobin A1C: 6 % — AB (ref 4.0–5.6)

## 2022-08-17 MED ORDER — METFORMIN HCL ER 750 MG PO TB24: ORAL_TABLET | ORAL | 3 refills | Status: DC

## 2022-08-17 MED ORDER — TRIAMCINOLONE ACETONIDE 0.1 % EX CREA: 1.0000 | TOPICAL_CREAM | Freq: Two times a day (BID) | CUTANEOUS | 1 refills | Status: AC | PRN

## 2022-08-17 NOTE — Assessment & Plan Note (Signed)
Sounds like atrial fib Will check EKG 

## 2022-08-17 NOTE — Assessment & Plan Note (Signed)
EKG shows some irregularity at times but stable R-R interval at others---but no clear p waves ?atrial flutter vs fib Was told not to take blood thinners after stroke--will hold off on eliquis and get her in with cardiology ASAP  If can take eliquis, could be candidate for cardioversion--though not symptomatic

## 2022-08-17 NOTE — Assessment & Plan Note (Signed)
Down to 32 No nephrologist now Still on losartan 100 daily Might want to stop the hydrochlorothiazide if not better

## 2022-08-17 NOTE — Assessment & Plan Note (Signed)
2 lesions on upper left neck Discussed risks/benefits of cryotherapy--she gave verbal consent  Liquid nitrogen 25 seconds x 2 to each lesion Tolerated well Discussed home care She should set up routine care with derm again

## 2022-08-17 NOTE — Assessment & Plan Note (Signed)
Slightly better Son able to get her out at times Continues on the venlafaxine 37.5mg  daily

## 2022-08-17 NOTE — Assessment & Plan Note (Signed)
BP Readings from Last 3 Encounters:  08/17/22 130/84  02/06/22 136/88  08/04/21 140/90   Will plan to stop the hydrochlorothiazide if GFR goes down

## 2022-08-17 NOTE — Progress Notes (Signed)
Subjective:    Patient ID: [name], female    DOB: 1940-11-07, 82 y.o.   MRN: [id number]  HPI Here with son for follow up of diabetes and other chronic issues  Has spots on face--that won't heal Needs dermatologist  Rash on chest also New furry comforter--did take that away Not out in the sun  Not checking sugars Somewhat compliant with her eating  Discussed the reduced GFR---was down a bit to 32 last time Does try to drink enough water Has urinary frequency--but not much volume  Still uses walker at home Wheelchair when out Can do all ADLs Son does the housework ,etc  Mood may be slightly better Son has worked to get her out some  No chest pain or SOB No palpitations  Current Outpatient Medications on File Prior to Visit  Medication Sig Dispense Refill   Accu-Chek Softclix Lancets lancets Use to obtain blood sugar sample Dx Code E11.40 100 each 3   glipiZIDE (GLUCOTROL XL) 2.5 MG 24 hr tablet TAKE 1 TABLET IN THE MORNING AND AT BEDTIME. 180 tablet 3   glucose blood (ACCU-CHEK GUIDE) test strip Use to check blood sugar once daily Dx Code E11.40 100 each 3   hyoscyamine (LEVSIN) 0.125 MG tablet Take 1 tablet (0.125 mg total) by mouth 3 (three) times daily as needed. Before meals. Don't use imodium also 30 tablet 0   losartan-hydrochlorothiazide (HYZAAR) 100-25 MG tablet TAKE 1 TABLET EVERY DAY 90 tablet 3   metFORMIN (GLUCOPHAGE-XR) 750 MG 24 hr tablet TAKE 2 TABLETS EVERY DAY WITH BREAKFAST 180 tablet 3   metoprolol succinate (TOPROL-XL) 25 MG 24 hr tablet TAKE 1/2 TABLET EVERY DAY 45 tablet 3   Multiple Vitamin (MULTIVITAMIN) tablet Take 1 tablet by mouth daily.     venlafaxine (EFFEXOR) 37.5 MG tablet Take 1 tablet (37.5 mg total) by mouth daily at 12 noon. 90 tablet 1   cholestyramine (QUESTRAN) 4 g packet Take 1 packet (4 g total) by mouth daily. 90 each 3   No current facility-administered medications on file prior to visit.    Allergies  Allergen  Reactions   Diphenhydramine Hcl Other (See Comments)    hallucinations    Fluoxetine Hcl     REACTION: raised blood pressure   Lisinopril     REACTION: dizzy   Sertraline Hcl     REACTION: dizziness    Past Medical History:  Diagnosis Date   Allergic rhinitis    Anal fissure    Arthritis    Asthma    Depression    Diabetes mellitus    type 2   Diverticulosis    Fibromyalgia    GERD (gastroesophageal reflux disease)    Hypertension    Obesity    Osteoarthritis     Past Surgical History:  Procedure Laterality Date   ABDOMINAL HYSTERECTOMY  1970's   CATARACT EXTRACTION, BILATERAL     CHOLECYSTECTOMY  4/06   TUBAL LIGATION  1970's   VENTRAL HERNIA REPAIR  9/08   [facility]    Family History  Problem Relation Age of Onset   Heart attack Father    Coronary artery disease Father    Kidney cancer Mother    Lung cancer Brother    Liver cancer Brother    Diabetes Other        Maternal side   Brain cancer Daughter    Hypertension Neg Hx    Breast cancer Neg Hx    Colon cancer Neg Hx  Social History   Socioeconomic History   Marital status: Divorced    Spouse name: Not on file   Number of children: 3   Years of education: Not on file   Highest education level: Not on file  Occupational History   Occupation: Homemaker    Employer: RETIRED  Tobacco Use   Smoking status: Never    Passive exposure: Past   Smokeless tobacco: Never  Vaping Use   Vaping Use: Never used  Substance and Sexual Activity   Alcohol use: No    Alcohol/week: 0.0 standard drinks of alcohol   Drug use: Not on file   Sexual activity: Not on file  Other Topics Concern   Not on file  Social History Narrative   No living will   No health care POA but would want son, Casimiro Needle, to make decisions about her care if needed. Other child are alternatives   Would accept resuscitation attempts   Probably wouldn't want tube feeds if cognitively unaware   Social Determinants of Health    Financial Resource Strain: Not on file  Food Insecurity: Not on file  Transportation Needs: Not on file  Physical Activity: Not on file  Stress: Not on file  Social Connections: Not on file  Intimate Partner Violence: Not on file   Review of Systems Weight is stable Not sleeping well--nothing new    Objective:   Physical Exam Constitutional:      Appearance: Normal appearance.  Cardiovascular:     Rate and Rhythm: Normal rate. Rhythm irregular.  Pulmonary:     Effort: Pulmonary effort is normal.     Breath sounds: Normal breath sounds. No wheezing or rales.  Musculoskeletal:     Comments: Trace left ankle edema--none on right  Skin:    Comments: Mild rash on chest (looks like contact)--will give TAC 2 actinics on upper left neck  (close to ear)  Neurological:     Mental Status: She is alert.  Psychiatric:        Mood and Affect: Mood normal.        Behavior: Behavior normal.            Assessment & Plan:

## 2022-08-17 NOTE — Assessment & Plan Note (Signed)
Lab Results  Component Value Date   HGBA1C 6.0 (A) 08/17/2022   Still well controlled on the glipizide 2.5mg  bid and metformin 1500mg  daily

## 2022-08-17 NOTE — Assessment & Plan Note (Signed)
Mild stable symptoms

## 2022-08-18 LAB — CBC
HCT: 28.7 % — ABNORMAL LOW (ref 36.0–46.0)
Hemoglobin: 9.5 g/dL — ABNORMAL LOW (ref 12.0–15.0)
MCHC: 33.2 g/dL (ref 30.0–36.0)
MCV: 95.7 fl (ref 78.0–100.0)
Platelets: 190 10*3/uL (ref 150.0–400.0)
RBC: 3 Mil/uL — ABNORMAL LOW (ref 3.87–5.11)
RDW: 14.8 % (ref 11.5–15.5)
WBC: 7.2 10*3/uL (ref 4.0–10.5)

## 2022-08-18 LAB — RENAL FUNCTION PANEL
Albumin: 3.8 g/dL (ref 3.5–5.2)
BUN: 27 mg/dL — ABNORMAL HIGH (ref 6–23)
CO2: 26 mEq/L (ref 19–32)
Calcium: 9.3 mg/dL (ref 8.4–10.5)
Chloride: 100 mEq/L (ref 96–112)
Creatinine, Ser: 1.62 mg/dL — ABNORMAL HIGH (ref 0.40–1.20)
GFR: 29.73 mL/min — ABNORMAL LOW (ref >60.00)
Glucose, Bld: 173 mg/dL — ABNORMAL HIGH (ref 70–99)
Phosphorus: 3.1 mg/dL (ref 2.3–4.6)
Potassium: 4.8 mEq/L (ref 3.5–5.1)
Sodium: 134 mEq/L — ABNORMAL LOW (ref 135–145)

## 2022-08-18 LAB — TSH: TSH: 2.59 u[IU]/mL (ref 0.35–5.50)

## 2022-08-18 LAB — HEPATIC FUNCTION PANEL
ALT: 10 U/L (ref 0–35)
AST: 17 U/L (ref 0–37)
Albumin: 3.8 g/dL (ref 3.5–5.2)
Alkaline Phosphatase: 54 U/L (ref 39–117)
Bilirubin, Direct: 0.2 mg/dL (ref 0.0–0.3)
Total Bilirubin: 0.6 mg/dL (ref 0.2–1.2)
Total Protein: 7.2 g/dL (ref 6.0–8.3)

## 2022-08-18 LAB — LIPID PANEL
Cholesterol: 107 mg/dL (ref 0–200)
HDL: 43.2 mg/dL (ref >39.00)
LDL Cholesterol: 41 mg/dL (ref 0–99)
NonHDL: 63.49
Total CHOL/HDL Ratio: 2
Triglycerides: 110 mg/dL (ref 0.0–149.0)
VLDL: 22 mg/dL (ref 0.0–40.0)

## 2022-08-21 ENCOUNTER — Other Ambulatory Visit: Payer: Self-pay | Admitting: Internal Medicine

## 2022-08-21 ENCOUNTER — Encounter: Payer: Self-pay | Admitting: *Deleted

## 2022-08-21 DIAGNOSIS — N1832 Chronic kidney disease, stage 3b: Secondary | ICD-10-CM

## 2022-08-21 NOTE — Progress Notes (Signed)
Consult placed

## 2022-09-11 DIAGNOSIS — K529 Noninfective gastroenteritis and colitis, unspecified: Secondary | ICD-10-CM | POA: Diagnosis not present

## 2022-10-06 IMAGING — MR MR HEAD W/O CM
11 series · 47 of 48 positions shown · non-contrast
Comparison: 10/29/2020

CLINICAL DATA: Head injury October 2020, headaches and memory
loss

EXAM:
MRI HEAD WITHOUT CONTRAST
TECHNIQUE: Multiplanar, multiecho pulse sequences of the brain and surrounding
structures were obtained without intravenous contrast.

[Series 5: ax dwi_tracew · axial · 3.0mm · 0.65mm/px · z∈[-91,+57]mm · 4 of 48 slices shown]
[im 1/48]
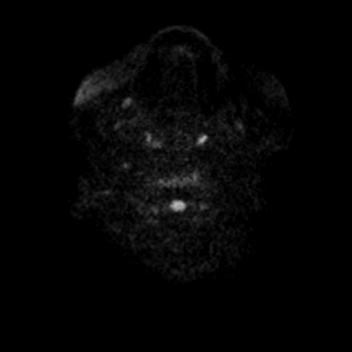
[im 16/48]
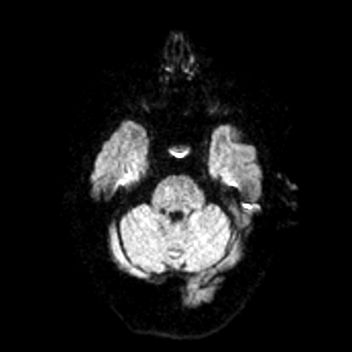
[im 32/48]
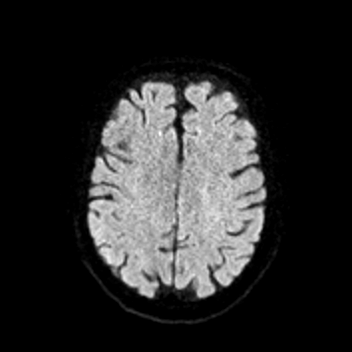
[im 48/48]
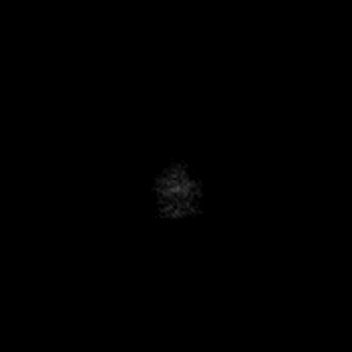

[Series 6: ax dwi_adc · axial · 3.0mm · 0.65mm/px · z∈[-91,+57]mm · 4 of 48 slices shown]
[im 1/48]
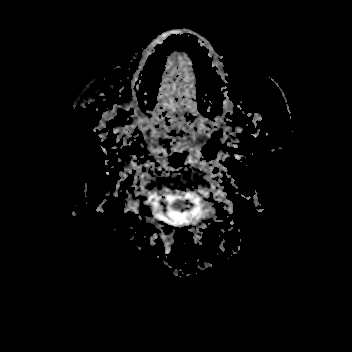
[im 16/48]
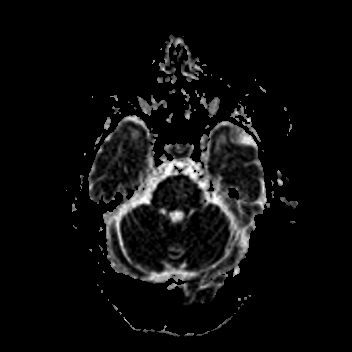
[im 32/48]
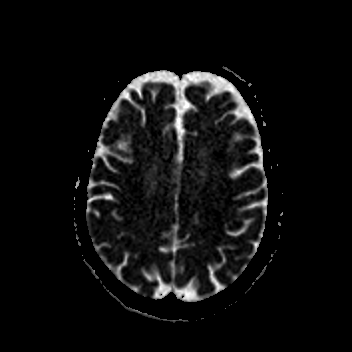
[im 48/48]
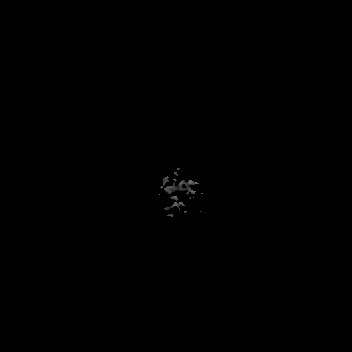

[Series 7: cor dwi_tracew · coronal · 5.0mm · 0.65mm/px · 3 of 38 slices shown]
[im 1/38]
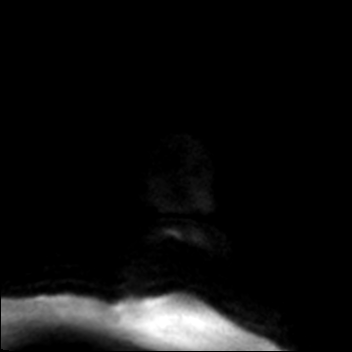
[im 19/38]
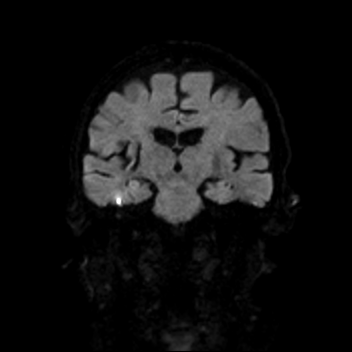
[im 38/38]
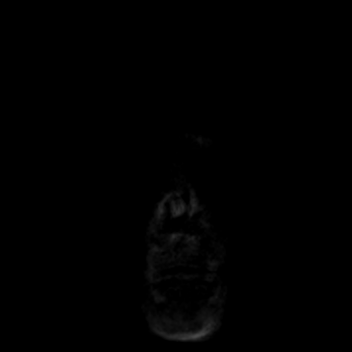

[Series 8: cor dwi_adc · coronal · 5.0mm · 0.65mm/px · 3 of 38 slices shown]
[im 1/38]
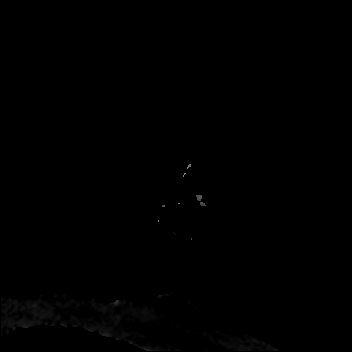
[im 19/38]
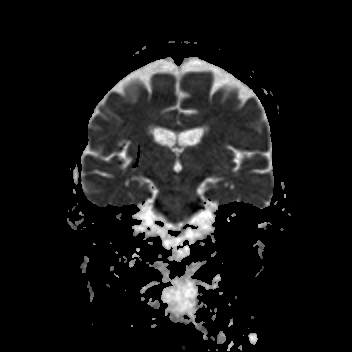
[im 38/38]
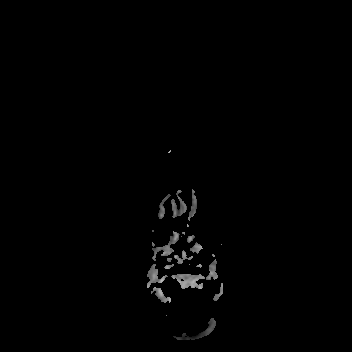

[Series 9: T1 · sagittal · 5.0mm · 0.62mm/px · 2 of 25 slices shown (1 of 2)]
[im 1/25]
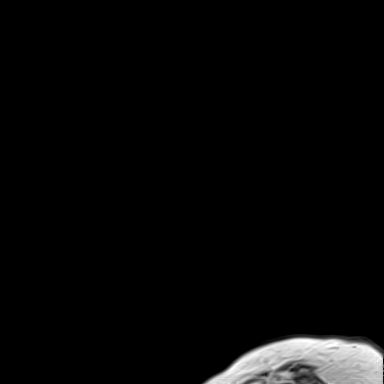
[im 25/25]
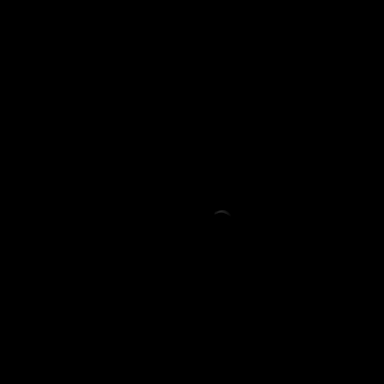

[Series 10: T2 · axial · 5.0mm · 0.53mm/px · z∈[-89,+54]mm · 2 of 26 slices shown (1 of 2)]
[im 1/26]
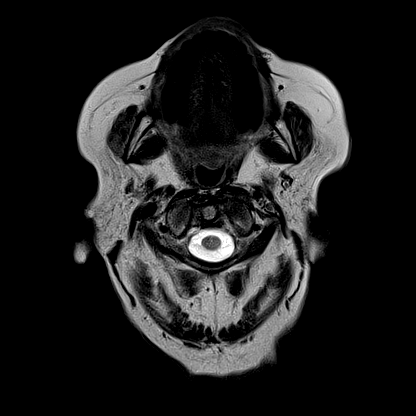
[im 26/26]
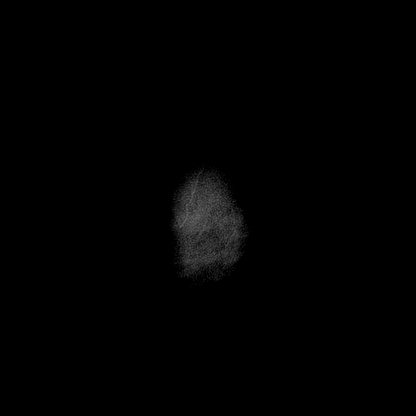

[Series 12: pha_images · axial · 3.0mm · 0.90mm/px · z∈[-99,+69]mm · 5 of 58 slices shown]
[im 1/58]
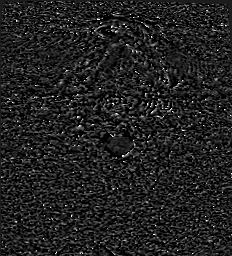
[im 15/58]
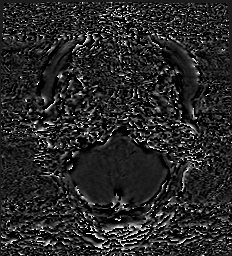
[im 29/58]
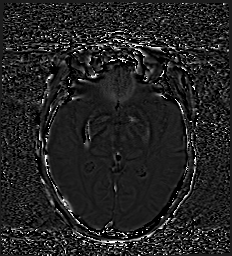
[im 43/58]
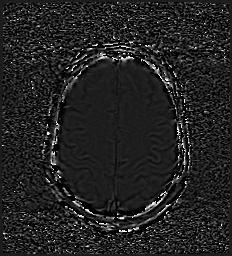
[im 58/58]
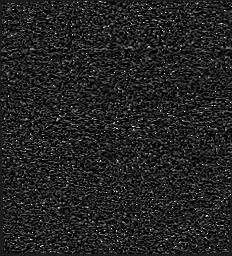

[Series 13: swi_images · axial · 3.0mm · 0.90mm/px · z∈[-99,+69]mm · 5 of 60 slices shown]
[im 1/60]
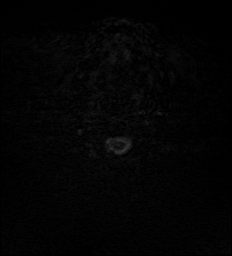
[im 15/60]
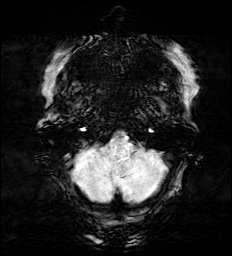
[im 30/60]
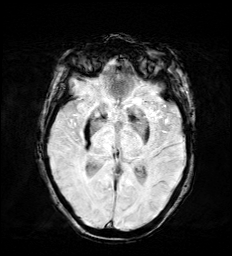
[im 45/60]
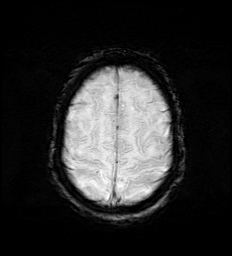
[im 60/60]
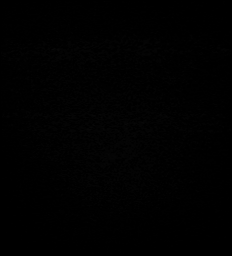

[Series 15: FLAIR · axial · 3.0mm · 0.53mm/px · z∈[-94,+60]mm · 4 of 55 slices shown]
[im 1/55]
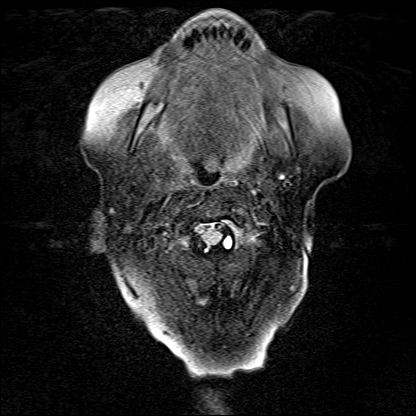
[im 19/55]
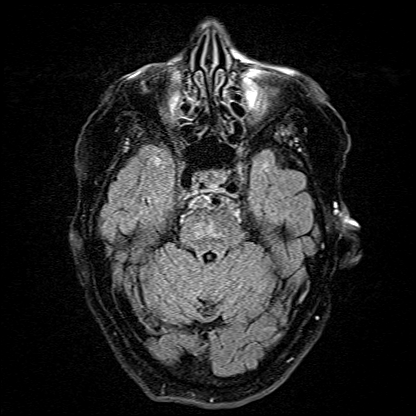
[im 37/55]
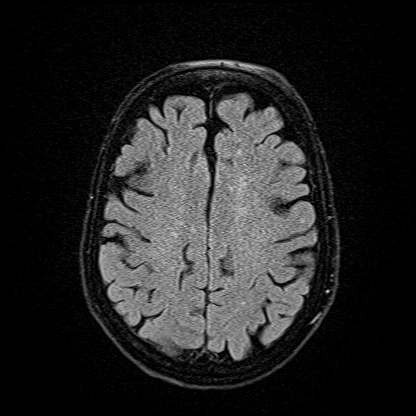
[im 55/55]
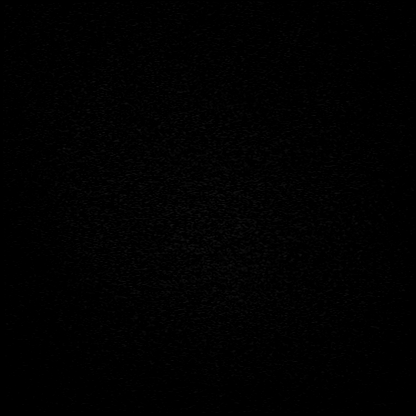

[Series 16: T1 · axial · 1.0mm · 0.98mm/px · z∈[-97,+69]mm · 13 of 176 slices shown (2 of 2)]
[im 1/176]
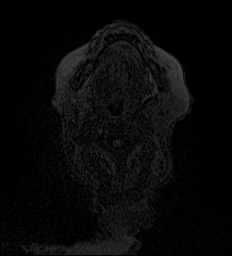
[im 14/176]
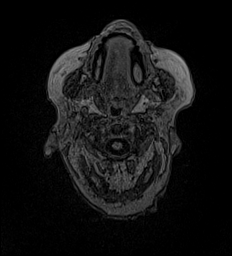
[im 27/176]
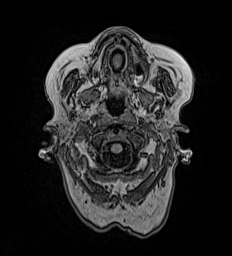
[im 41/176]
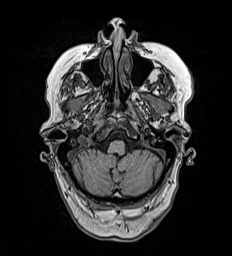
[im 54/176]
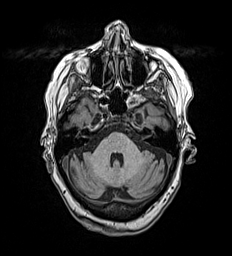
[im 68/176]
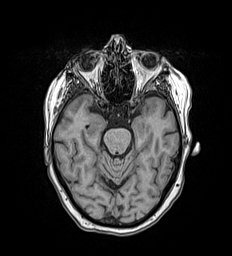
[im 81/176]
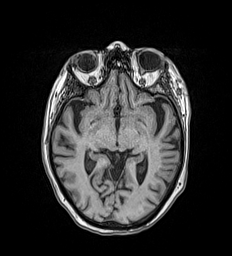
[im 95/176]
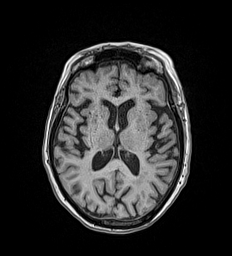
[im 108/176]
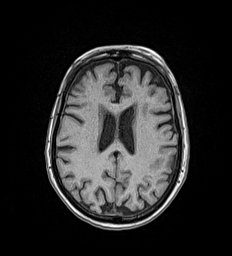
[im 122/176]
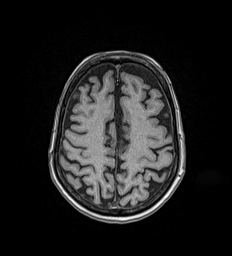
[im 135/176]
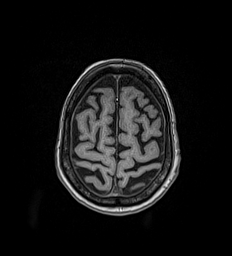
[im 149/176]
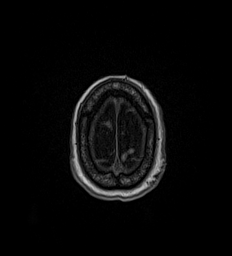
[im 176/176]
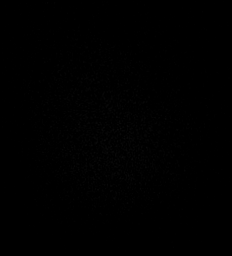

[Series 17: T2 · coronal · 5.0mm · 0.57mm/px · 2 of 29 slices shown (2 of 2)]
[im 1/29]
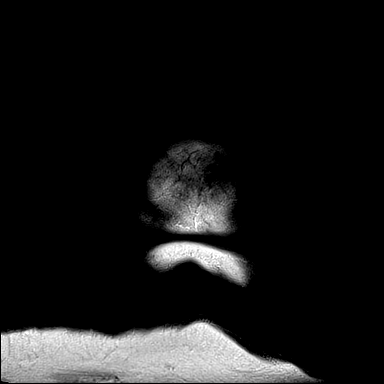
[im 29/29]
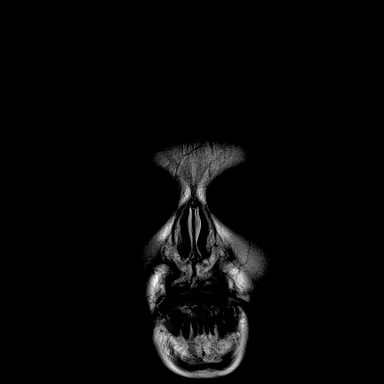

[47 of 48 positions shown; findings below may reference images not displayed]

FINDINGS: Brain: The area of right subinsular hemorrhage has expectedly
evolved with residual chronic blood products. No acute infarction or
hemorrhage. Ventricles and sulci are stable in size and
configuration. Patchy foci of T2 hyperintensity in the
supratentorial and pontine white matter are nonspecific but probably
reflects stable chronic microvascular ischemic changes. No
intracranial mass, mass effect, or edema. No hydrocephalus or
extra-axial collection.

Vascular: Major vessel flow voids at the skull base are preserved.

Skull and upper cervical spine: Normal marrow signal is preserved.

Sinuses/Orbits: Minor mucosal thickening.  Right scleral buckle.

Other: Sella is unremarkable.  Mastoid air cells are clear.
IMPRESSION: No new abnormality.  Expected evolution of prior hemorrhage.

## 2022-10-13 ENCOUNTER — Other Ambulatory Visit: Payer: Self-pay | Admitting: Nephrology

## 2022-10-13 DIAGNOSIS — I10 Essential (primary) hypertension: Secondary | ICD-10-CM | POA: Diagnosis not present

## 2022-10-13 DIAGNOSIS — E1122 Type 2 diabetes mellitus with diabetic chronic kidney disease: Secondary | ICD-10-CM | POA: Diagnosis not present

## 2022-10-13 DIAGNOSIS — R600 Localized edema: Secondary | ICD-10-CM

## 2022-10-13 DIAGNOSIS — N184 Chronic kidney disease, stage 4 (severe): Secondary | ICD-10-CM

## 2022-10-13 DIAGNOSIS — N189 Chronic kidney disease, unspecified: Secondary | ICD-10-CM | POA: Diagnosis not present

## 2022-10-15 ENCOUNTER — Ambulatory Visit
Admission: RE | Admit: 2022-10-15 | Discharge: 2022-10-15 | Disposition: A | Discharge status: Home / Self Care | Payer: Medicare PPO | Source: Ambulatory Visit | Attending: Nephrology | Admitting: Nephrology

## 2022-10-15 DIAGNOSIS — R600 Localized edema: Secondary | ICD-10-CM

## 2022-10-15 DIAGNOSIS — I10 Essential (primary) hypertension: Secondary | ICD-10-CM | POA: Insufficient documentation

## 2022-10-15 DIAGNOSIS — N184 Chronic kidney disease, stage 4 (severe): Secondary | ICD-10-CM

## 2022-10-15 DIAGNOSIS — M7122 Synovial cyst of popliteal space [Baker], left knee: Secondary | ICD-10-CM | POA: Diagnosis not present

## 2022-10-16 NOTE — Progress Notes (Unsigned)
Cardiology Office Note  Date:  10/19/2022   ID:  RENNY SNELGROVE, DOB [birth year], MRN [id number]  PCP:  Karie Schwalbe, MD   Chief Complaint  Patient presents with   New Patient (Initial Visit)    Ref by Dr. Alphonsus Sias for A-Fib. Patient c/o left leg swelling & shortness of breath with over exertion. Medications reviewed by the patient verbally.     HPI:  [name] is a 82 year old woman with past medical history of Diabetes Hypertension Chronic kidney disease stage IIIb History of stroke September 2022, hemorrhagic felt secondary to hypertension History of falls Who presents by referral from [facility] for irregular heart rhythm/atrial fibrillation  EKG August 17, 2022 showing atrial fibrillation, with PMD Reports having fatigue, worsening leg swelling left lower extremity, some shortness of breath with overexertion Presents today in a wheelchair No significant PND orthopnea No regular exercise program  EKG personally reviewed by myself on todays visit EKG Interpretation Date/Time:  Monday October 19 2022 11:03:05 EDT Ventricular Rate:  134 PR Interval:    QRS Duration:  94 QT Interval:  280 QTC Calculation: 418 R Axis:   6  Text Interpretation: Atrial fibrillation with rapid ventricular response Cannot rule out Anterior infarct , age undetermined When compared with ECG of 26-Oct-2020 16:15, Atrial fibrillation has replaced Sinus rhythm Vent. rate has increased BY  55 BPM QRS axis Shifted right Confirmed by Julien Nordmann [phone]) on 10/19/2022 11:14:25 AM    Prior history reviewed History of hemorrhagic stroke September  2022 hit the back of her head due to a fall backwards in her wheelchair while her son was wheeling her down the steps. CT head in the ED showed a questionable right basal ganglia mass versus hemorrhage; therefore, an MRI was obtained. MRI revealed a subacute hemorrhagic lesion involving the right basal ganglia.  Subacute hemorrhagic stroke. MRI of the  brain showing a 2.5 x 1.1 x 1.8 cm well-circumscribed lenticular lesion involving the right lenticular form nucleus.   Per Neurology October 30, 2020 "the hemorrhage seen on MRI appears likely to be several days if not over one week old. It does not appear likely to be secondary to trauma. It is most likely a hypertensive bleed. "  Echo 9/22: normal EF, normal LA size    PMH:   has a past medical history of Allergic rhinitis, Anal fissure, Arthritis, Asthma, Depression, Diabetes mellitus, Diverticulosis, Fibromyalgia, GERD (gastroesophageal reflux disease), Hypertension, Obesity, and Osteoarthritis.  PSH:    Past Surgical History:  Procedure Laterality Date   ABDOMINAL HYSTERECTOMY  1970's   CATARACT EXTRACTION, BILATERAL     CHOLECYSTECTOMY  4/06   TUBAL LIGATION  1970's   VENTRAL HERNIA REPAIR  9/08   [facility]    Current Outpatient Medications  Medication Sig Dispense Refill   cholestyramine (QUESTRAN) 4 g packet Take 1 packet (4 g total) by mouth daily. 90 each 3   glipiZIDE (GLUCOTROL XL) 2.5 MG 24 hr tablet TAKE 1 TABLET IN THE MORNING AND AT BEDTIME. 180 tablet 3   hyoscyamine (LEVSIN) 0.125 MG tablet Take 1 tablet (0.125 mg total) by mouth 3 (three) times daily as needed. Before meals. Don't use imodium also 30 tablet 0   losartan-hydrochlorothiazide (HYZAAR) 100-25 MG tablet TAKE 1 TABLET EVERY DAY 90 tablet 3   metFORMIN (GLUCOPHAGE-XR) 750 MG 24 hr tablet TAKE 2 TABLETS EVERY DAY WITH BREAKFAST 180 tablet 3   metoprolol succinate (TOPROL-XL) 25 MG 24 hr tablet TAKE 1/2 TABLET EVERY DAY 45 tablet  3   Multiple Vitamin (MULTIVITAMIN) tablet Take 1 tablet by mouth daily.     triamcinolone cream (KENALOG) 0.1 % Apply 1 Application topically 2 (two) times daily as needed. 45 g 1   venlafaxine (EFFEXOR) 37.5 MG tablet Take 1 tablet (37.5 mg total) by mouth daily at 12 noon. 90 tablet 1   Accu-Chek Softclix Lancets lancets Use to obtain blood sugar sample Dx Code E11.40 (Patient  not taking: Reported on 10/19/2022) 100 each 3   glucose blood (ACCU-CHEK GUIDE) test strip Use to check blood sugar once daily Dx Code E11.40 (Patient not taking: Reported on 10/19/2022) 100 each 3   No current facility-administered medications for this visit.    Allergies:   Diphenhydramine, Diphenhydramine hcl, Diphenhydramine hcl, Doxycycline, Lisinopril, Sertraline hcl, Sertraline hcl, Fluoxetine hcl, and Fluoxetine hcl   Social History:  The patient  reports that she has never smoked. She has been exposed to tobacco smoke. She has never used smokeless tobacco. She reports that she does not drink alcohol.   Family History:   family history includes Brain cancer in her daughter; Coronary artery disease in her father; Diabetes in an other family member; Heart attack in her father; Kidney cancer in her mother; Liver cancer in her brother; Lung cancer in her brother.    Review of Systems: Review of Systems  Constitutional:  Positive for malaise/fatigue.  HENT: Negative.    Respiratory:  Positive for shortness of breath.   Cardiovascular:  Positive for leg swelling.  Gastrointestinal: Negative.   Musculoskeletal: Negative.   Neurological: Negative.   Psychiatric/Behavioral: Negative.    All other systems reviewed and are negative.    PHYSICAL EXAM: VS:  BP 128/78 (BP Location: Right Arm, Patient Position: Sitting, Cuff Size: Normal)   Pulse (!) 134   Ht 5\' 4"  (1.626 m)   Wt 182 lb (82.6 kg)   SpO2 98%   BMI 31.24 kg/m  , BMI Body mass index is 31.24 kg/m. GEN: Well nourished, well developed, in no acute distress HEENT: normal Neck: no JVD, carotid bruits, or masses Cardiac: Irregularly irregular; no murmurs, rubs, or gallops,no edema  Respiratory:  clear to auscultation bilaterally, normal work of breathing GI: soft, nontender, nondistended, + BS MS: no deformity or atrophy Skin: warm and dry, no rash Neuro:  Strength and sensation are intact Psych: euthymic mood, full  affect   Recent Labs: 08/17/2022: ALT 10; BUN 27; Creatinine, Ser 1.62; Hemoglobin 9.5; Platelets 190.0; Potassium 4.8; Sodium 134; TSH 2.59    Lipid Panel Lab Results  Component Value Date   CHOL 107 08/17/2022   HDL 43.20 08/17/2022   LDLCALC 41 08/17/2022   TRIG 110.0 08/17/2022      Wt Readings from Last 3 Encounters:  10/19/22 182 lb (82.6 kg)  08/17/22 179 lb (81.2 kg)  02/06/22 181 lb (82.1 kg)       ASSESSMENT AND PLAN:  Problem List Items Addressed This Visit       Cardiology Problems   Essential hypertension   Relevant Orders   EKG 12-Lead (Completed)   Atrial fibrillation (HCC) - Primary   Relevant Orders   EKG 12-Lead (Completed)     Other   Type 2 diabetes mellitus with stage 2 chronic kidney disease, without long-term current use of insulin (HCC)   Stage 3b chronic kidney disease (HCC)   Other Visit Diagnoses     History of stroke          Persistent atrial fibrillation Noted on recent  visit with primary care August 17, 2022 At that time started on metoprolol succinate 12.5 daily Elevated heart rate on today's visit rate in the 130 range but reports that she did not sleep well last night We have recommended she increase metoprolol succinate up to 25 twice daily but she will do this incrementally by half doses with close monitoring of heart rate at home We will start Eliquis 2.5 twice daily, reduced dose for chronic renal failure creatinine greater than 1.5 and age over 31 We did discuss risk and benefit of anticoagulation especially in light of prior hemorrhagic stroke   lower extremity edema Unilateral on the left, minimal on the right Recommend Lasix 20 mg daily with potassium 10 daily Likely exacerbated by atrial fibrillation Reports that seem to start on July 8 and has not changed since that time Also suggested compression hose, leg elevation Venous Doppler performed October 15, 2022, no DVT  incidental finding of Baker's cyst on  left  Chronic kidney disease stage IIIb Likely secondary to nephropathy in the setting of diabetes Creatinine 1.59 Followed by nephrology  Baker's cyst 4.7 cm on left, could be causing compression, may need to be drained if edema persists  Anemia Will monitor closely on Eliquis Likely secondary to renal dysfunction Hemoglobin 8.6 Labs ordered in 2 weeks   Total encounter time more than 60 minutes  Greater than 50% was spent in counseling and coordination of care with the patient    Signed, Dossie Arbour, M.D., Ph.D. [facility] [place], [place] [phone]

## 2022-10-19 ENCOUNTER — Encounter: Payer: Self-pay | Admitting: Cardiovascular Disease

## 2022-10-19 ENCOUNTER — Ambulatory Visit: Payer: Medicare PPO | Attending: Cardiovascular Disease | Admitting: Cardiovascular Disease

## 2022-10-19 ENCOUNTER — Telehealth: Payer: Self-pay | Admitting: Emergency Medicine

## 2022-10-19 VITALS — BP 128/78 | HR 134 | Ht 64.0 in | Wt 182.0 lb

## 2022-10-19 DIAGNOSIS — I10 Essential (primary) hypertension: Secondary | ICD-10-CM

## 2022-10-19 DIAGNOSIS — I4819 Other persistent atrial fibrillation: Secondary | ICD-10-CM | POA: Diagnosis not present

## 2022-10-19 DIAGNOSIS — E1122 Type 2 diabetes mellitus with diabetic chronic kidney disease: Secondary | ICD-10-CM

## 2022-10-19 DIAGNOSIS — N182 Chronic kidney disease, stage 2 (mild): Secondary | ICD-10-CM | POA: Diagnosis not present

## 2022-10-19 DIAGNOSIS — Z79899 Other long term (current) drug therapy: Secondary | ICD-10-CM

## 2022-10-19 DIAGNOSIS — Z8673 Personal history of transient ischemic attack (TIA), and cerebral infarction without residual deficits: Secondary | ICD-10-CM | POA: Diagnosis not present

## 2022-10-19 DIAGNOSIS — N1832 Chronic kidney disease, stage 3b: Secondary | ICD-10-CM | POA: Diagnosis not present

## 2022-10-19 DIAGNOSIS — Z7984 Long term (current) use of oral hypoglycemic drugs: Secondary | ICD-10-CM | POA: Diagnosis not present

## 2022-10-19 MED ORDER — METOPROLOL SUCCINATE ER 25 MG PO TB24: 25.0000 mg | ORAL_TABLET | Freq: Two times a day (BID) | ORAL | 3 refills | Status: DC

## 2022-10-19 MED ORDER — APIXABAN 2.5 MG PO TABS: 2.5000 mg | ORAL_TABLET | Freq: Two times a day (BID) | ORAL | 3 refills | Status: DC

## 2022-10-19 MED ORDER — FUROSEMIDE 20 MG PO TABS
20.0000 mg | ORAL_TABLET | Freq: Every day | ORAL | 3 refills | Status: DC
Start: 2022-10-19 — End: 2023-06-22

## 2022-10-19 MED ORDER — POTASSIUM CHLORIDE ER 10 MEQ PO TBCR: 10.0000 meq | EXTENDED_RELEASE_TABLET | Freq: Every day | ORAL | 3 refills | Status: DC

## 2022-10-19 MED ORDER — APIXABAN 2.5 MG PO TABS: 2.5000 mg | ORAL_TABLET | Freq: Two times a day (BID) | ORAL | Status: DC

## 2022-10-19 NOTE — Patient Instructions (Addendum)
Medication Instructions:  Eliquis 2.5 mg twice a day Lasix/furosemide 20 mg daily with potassium 10 meq daily Metoprolol succinate 25 mg twice a day  If you need a refill on your cardiac medications before your next appointment, please call your pharmacy.   Lab work: No new labs needed  Testing/Procedures: No new testing needed  Follow-Up: At [facility], you and your health needs are our priority.  As part of our continuing mission to provide you with exceptional heart care, we have created designated Provider Care Teams.  These Care Teams include your primary Cardiologist (physician) and Advanced Practice Providers (APPs -  Physician Assistants and Nurse Practitioners) who all work together to provide you with the care you need, when you need it.  You will need a follow up appointment in 1 month  Providers on your designated Care Team:   Nicolasa Ducking, NP Eula Listen, PA-C Cadence Fransico [place], [place]  COVID-19 Vaccine Information can be found at: [email or web address] For questions related to vaccine distribution or appointments, please email [email or web address] or call [phone].

## 2022-10-19 NOTE — Telephone Encounter (Signed)
Called and spoke with patient. Informed patient that Dr. Mariah Milling would like a CBC and BMP in 2 weeks. Patient given instructions on where to have labs drawn. Patient verbalizes understanding.

## 2022-10-19 NOTE — Telephone Encounter (Signed)
Received a message from Dr. Mariah Milling requesting a CBC and BMP in 2 weeks. Orders placed. Called and left a message for the patient to call back.

## 2022-10-21 ENCOUNTER — Telehealth: Payer: Self-pay | Admitting: Internal Medicine

## 2022-10-21 MED ORDER — GLIPIZIDE ER 2.5 MG PO TB24: 2.5000 mg | ORAL_TABLET | Freq: Two times a day (BID) | ORAL | 0 refills | Status: DC

## 2022-10-21 NOTE — Telephone Encounter (Signed)
Prescription Request  10/21/2022  LOV: 08/17/2022  What is the name of the medication or equipment? glipiZIDE (GLUCOTROL XL) 2.5 MG 24 hr tablet   Have you contacted your pharmacy to request a refill? Yes   [facility] [address] ([address]), [place] - [address] [place]) [place] [address] Phone: [phone] Fax: [phone]    Patient notified that their request is being sent to the clinical staff for review and that they should receive a response within 2 business days.   Please advise at [facility] [phone]

## 2022-10-21 NOTE — Telephone Encounter (Signed)
Last refill sent 06/22/22 to [facility] for 1 year supply. 2 week supply sent to local pharmacy. Pt advised via MyChart.

## 2022-11-02 ENCOUNTER — Telehealth: Payer: Self-pay | Admitting: Cardiovascular Disease

## 2022-11-02 NOTE — Telephone Encounter (Signed)
  Pt c/o medication issue:  1. Name of Medication: Eliquis 2.5mg   2. How are you currently taking this medication (dosage and times per day)? As directed  3. Are you having a reaction (difficulty breathing--STAT)? no  4. What is your medication issue?    Patient's son called in because patient is going to run out of medication before her mail order prescription will be delivered. He says that Dr. Windell Hummingbird office gave her a free 30 day coupon for eliquis and he wants to know if a prescription can be sent in to [id number] [address] [place] ([address]), [place] - [address] so that he can use the coupon to pick up her medication until the mail order prescription can be delivered. Please advise.

## 2022-11-03 ENCOUNTER — Other Ambulatory Visit: Payer: Self-pay

## 2022-11-03 MED ORDER — APIXABAN 2.5 MG PO TABS: 2.5000 mg | ORAL_TABLET | Freq: Two times a day (BID) | ORAL | 0 refills | Status: DC

## 2022-11-03 NOTE — Telephone Encounter (Signed)
Left voicemail message to call back

## 2022-11-03 NOTE — Telephone Encounter (Signed)
Son returned RN's call regarding getting prescription for apixaban (ELIQUIS) 2.5 MG TABS tablet over to [id number] - [place] ([address]), [place] - [address].  Son noted patient only has 4 tablets left.

## 2022-11-03 NOTE — Telephone Encounter (Signed)
Spoke to patient's son after talking to staff at [facility]. Informed staff that patient had not received mail order apixaban (ELIQUIS) 2.5 MG TABS tablet yet and they were on last dose. They stated that a 1 week supply could be sent to a local pharmacy while the mail order that had be sent today expedited (2 - 3 business days) was in transit. Informed patient's son of the conversation with the mail order pharmacy and he stated that it would be fine for a week to the local pharmacy. Order sent to [facility] for 1 week supply.

## 2022-11-05 ENCOUNTER — Encounter: Payer: Self-pay | Admitting: Internal Medicine

## 2022-11-05 ENCOUNTER — Ambulatory Visit: Payer: Medicare PPO | Admitting: Internal Medicine

## 2022-11-05 VITALS — BP 110/80 | HR 146 | Temp 98.3°F | Ht 64.0 in

## 2022-11-05 DIAGNOSIS — E1122 Type 2 diabetes mellitus with diabetic chronic kidney disease: Secondary | ICD-10-CM | POA: Diagnosis not present

## 2022-11-05 DIAGNOSIS — I5031 Acute diastolic (congestive) heart failure: Secondary | ICD-10-CM | POA: Diagnosis not present

## 2022-11-05 DIAGNOSIS — N184 Chronic kidney disease, stage 4 (severe): Secondary | ICD-10-CM

## 2022-11-05 DIAGNOSIS — I4819 Other persistent atrial fibrillation: Secondary | ICD-10-CM | POA: Diagnosis not present

## 2022-11-05 DIAGNOSIS — I679 Cerebrovascular disease, unspecified: Secondary | ICD-10-CM

## 2022-11-05 DIAGNOSIS — Z0000 Encounter for general adult medical examination without abnormal findings: Secondary | ICD-10-CM | POA: Diagnosis not present

## 2022-11-05 DIAGNOSIS — Z7984 Long term (current) use of oral hypoglycemic drugs: Secondary | ICD-10-CM

## 2022-11-05 DIAGNOSIS — G8194 Hemiplegia, unspecified affecting left nondominant side: Secondary | ICD-10-CM | POA: Diagnosis not present

## 2022-11-05 DIAGNOSIS — N182 Chronic kidney disease, stage 2 (mild): Secondary | ICD-10-CM | POA: Diagnosis not present

## 2022-11-05 DIAGNOSIS — Z23 Encounter for immunization: Secondary | ICD-10-CM | POA: Diagnosis not present

## 2022-11-05 MED ORDER — METOPROLOL SUCCINATE ER 25 MG PO TB24: 12.5000 mg | ORAL_TABLET | Freq: Every day | ORAL | 0 refills | Status: DC

## 2022-11-05 MED ORDER — METFORMIN HCL ER 750 MG PO TB24: 750.0000 mg | ORAL_TABLET | Freq: Every day | ORAL | 3 refills | Status: DC

## 2022-11-05 NOTE — Assessment & Plan Note (Signed)
With rapid rate Didn't tolerate metoprolol 12.5 bid--back to daily Thinks she is not tolerating eliquis 2.5 bid--asked her to stick with it Will discuss considering digoxin or low dose diltiazem with Dr Mariah Milling

## 2022-11-05 NOTE — Assessment & Plan Note (Signed)
Fairly stable Is on losartan

## 2022-11-05 NOTE — Addendum Note (Signed)
Addended by: Eual Fines on: 11/05/2022 04:42 PM   Modules accepted: Orders

## 2022-11-05 NOTE — Progress Notes (Signed)
Vision Screening   Right eye Left eye Both eyes  Without correction     With correction 20/50 20/25 20/40   Hearing Screening - Comments:: Did not pass whisper test

## 2022-11-05 NOTE — Progress Notes (Signed)
Subjective:    Patient ID: [name], female    DOB: [birth year], 82 y.o.   MRN: [id number]  HPI Here with son for Medicare wellness visit and follow up of chronic health conditions With son Reviewed advanced directives Reviewed other doctors---[name]--nephrology, [name]--cardiology, [name]--GI No hospitalizations or surgery in the past year Vision is not good---blurry--needs eye doctor Hearing is not good---not excited about hearing aides No exercise--back to not getting out of bed No falls No alcohol or tobacco Lives with son. Can go to bathroom on her own. Can shower and dress herself. Does nothing else in the household Some memory issues---son feels it is lack of sleep  Cardiologist did start the eliquis Changed the metoprolol dose--but didn't tolerate (severe dizziness and brain fog) Doing better back on the lower dose--went back down after 2 days Thinks she is still having trouble with the elquis--brain fog, nausea Does feel palpitations--racing Ongoing edema--did have duplex per [name] DVT (just has Baker's cyst) No chest pain Does have DOE--like after getting up to bathroom and back Did get diuretic--but hasn't started it yet  Recent labs show GFR of 29  Not checking sugars A1c 6%  Having a cough--only when lying down No heartburn Occasional trouble swallowing dry things Not sleeping well  Mood has been "horrible" Lost best friend last week  Current Outpatient Medications on File Prior to Visit  Medication Sig Dispense Refill   Accu-Chek Softclix Lancets lancets Use to obtain blood sugar sample Dx Code E11.40 100 each 3   apixaban (ELIQUIS) 2.5 MG TABS tablet Take 1 tablet (2.5 mg total) by mouth 2 (two) times daily. 14 tablet 0   furosemide (LASIX) 20 MG tablet Take 1 tablet (20 mg total) by mouth daily. 90 tablet 3   glipiZIDE (GLUCOTROL XL) 2.5 MG 24 hr tablet Take 1 tablet (2.5 mg total) by mouth 2 (two) times daily. 28 tablet 0    glucose blood (ACCU-CHEK GUIDE) test strip Use to check blood sugar once daily Dx Code E11.40 100 each 3   losartan-hydrochlorothiazide (HYZAAR) 100-25 MG tablet TAKE 1 TABLET EVERY DAY 90 tablet 3   metFORMIN (GLUCOPHAGE-XR) 750 MG 24 hr tablet TAKE 2 TABLETS EVERY DAY WITH BREAKFAST 180 tablet 3   metoprolol succinate (TOPROL-XL) 25 MG 24 hr tablet Take 1 tablet (25 mg total) by mouth in the morning and at bedtime. (Patient taking differently: Take 12.5 mg by mouth in the morning and at bedtime.) 180 tablet 3   Multiple Vitamin (MULTIVITAMIN) tablet Take 1 tablet by mouth daily.     potassium chloride (KLOR-CON) 10 MEQ tablet Take 1 tablet (10 mEq total) by mouth daily. 90 tablet 3   triamcinolone cream (KENALOG) 0.1 % Apply 1 Application topically 2 (two) times daily as needed. 45 g 1   venlafaxine (EFFEXOR) 37.5 MG tablet Take 1 tablet (37.5 mg total) by mouth daily at 12 noon. 90 tablet 1   cholestyramine (QUESTRAN) 4 g packet Take 1 packet (4 g total) by mouth daily. 90 each 3   No current facility-administered medications on file prior to visit.    Allergies  Allergen Reactions   Diphenhydramine Other (See Comments)    hallucinations   Diphenhydramine Hcl Other (See Comments)    hallucinations    Diphenhydramine Hcl Other (See Comments)    Other reaction(s): Other (See Comments)  hallucinations   Doxycycline Other (See Comments)    GI side effects   GI side effects  GI side effects   Lisinopril Other (See Comments)    REACTION: dizzy  REACTION: dizzy  REACTION: dizzy  REACTION: dizzy REACTION: dizzy    REACTION: dizzy   Sertraline Hcl Other (See Comments)    REACTION: dizziness REACTION: dizziness    REACTION: dizziness   Sertraline Hcl Other (See Comments)    REACTION: dizziness  REACTION: dizziness   Fluoxetine Hcl Palpitations    REACTION: raised blood pressure REACTION: raised blood pressure    REACTION: raised blood pressure   Fluoxetine Hcl Palpitations     REACTION: raised blood pressure  REACTION: raised blood pressure    Past Medical History:  Diagnosis Date   Allergic rhinitis    Anal fissure    Arthritis    Asthma    Depression    Diabetes mellitus    type 2   Diverticulosis    Fibromyalgia    GERD (gastroesophageal reflux disease)    Hypertension    Obesity    Osteoarthritis     Past Surgical History:  Procedure Laterality Date   ABDOMINAL HYSTERECTOMY  1970's   CATARACT EXTRACTION, BILATERAL     CHOLECYSTECTOMY  4/06   TUBAL LIGATION  1970's   VENTRAL HERNIA REPAIR  9/08   [facility]    Family History  Problem Relation Age of Onset   Heart attack Father    Coronary artery disease Father    Kidney cancer Mother    Lung cancer Brother    Liver cancer Brother    Diabetes Other        Maternal side   Brain cancer Daughter    Hypertension Neg Hx    Breast cancer Neg Hx    Colon cancer Neg Hx     Social History   Socioeconomic History   Marital status: Divorced    Spouse name: Not on file   Number of children: 3   Years of education: Not on file   Highest education level: Not on file  Occupational History   Occupation: Dentist: RETIRED  Tobacco Use   Smoking status: Never    Passive exposure: Past   Smokeless tobacco: Never  Vaping Use   Vaping status: Never Used  Substance and Sexual Activity   Alcohol use: No    Alcohol/week: 0.0 standard drinks of alcohol   Drug use: Not on file   Sexual activity: Not on file  Other Topics Concern   Not on file  Social History Narrative   No living will   No health care POA but would want son, Anna Needle, to make decisions about her care if needed. Other child are alternatives   Would accept resuscitation attempts   Probably wouldn't want tube feeds if cognitively unaware   Social Determinants of Health   Financial Resource Strain: Not on file  Food Insecurity: Not on file  Transportation Needs: Not on file  Physical Activity: Not on file   Stress: Not on file  Social Connections: Not on file  Intimate Partner Violence: Not on file   Review of Systems Appetite is poor Weight seems stable Wears seat belt Teeth are okay--hasn't seen the dentist (only a few left--and partials she doesn't wear). Full upper No suspicious skin lesions Rash recently--better with TAC Bowels move--not regular (diarrhea alternates with being slow). Does take cholestyramine No sig back or joint pains Left side weaker than right since stroke    Objective:   Physical Exam Constitutional:      Appearance: Normal  appearance.  Cardiovascular:     Rate and Rhythm: Tachycardia present. Rhythm irregular.     Heart sounds: No murmur heard.    No gallop.     Comments: Rate around 125 Pulmonary:     Effort: Pulmonary effort is normal.     Breath sounds: Normal breath sounds. No wheezing or rales.  Abdominal:     Palpations: Abdomen is soft.     Tenderness: There is no abdominal tenderness.  Musculoskeletal:     Cervical back: Neck supple.     Comments: 1-2+ in legs/feet  Lymphadenopathy:     Cervical: No cervical adenopathy.  Skin:    Comments: No foot lesions  Neurological:     Mental Status: She is alert and oriented to person, place, and time.     Comments: Word naming 3--then froze Recall 0/3 Fairly normal sensation in feet Left leg weakness---2+/5, right 4/5  Psychiatric:        Mood and Affect: Mood normal.        Behavior: Behavior normal.            Assessment & Plan:

## 2022-11-05 NOTE — Assessment & Plan Note (Signed)
Having cough at night when lying down Will start the furosemide tonight Probably mostly chronotropic--need to find a way to slow her heart Will elevated HOB now

## 2022-11-05 NOTE — Assessment & Plan Note (Signed)
I have personally reviewed the Medicare Annual Wellness questionnaire and have noted 1. The patient's medical and social history 2. Their use of alcohol, tobacco or illicit drugs 3. Their current medications and supplements 4. The patient's functional ability including ADL's, fall risks, home safety risks and hearing or visual             impairment. 5. Diet and physical activities 6. Evidence for depression or mood disorders  The patients weight, height, BMI and visual acuity have been recorded in the chart I have made referrals, counseling and provided education to the patient based review of the above and I have provided the pt with a written personalized care plan for preventive services.  I have provided you with a copy of your personalized plan for preventive services. Please take the time to review along with your updated medication list.  No cancer screening Flu vaccine today Will not take COVID vaccine Consider RSV Discussed chair exercises

## 2022-11-05 NOTE — Assessment & Plan Note (Signed)
Doesn't want statin Stable functional status

## 2022-11-05 NOTE — Assessment & Plan Note (Signed)
Lab Results  Component Value Date   HGBA1C 6.0 (A) 08/17/2022   Good control on glipizide 2.5 and metformin 1500 daily Will cut the metformin due to CKD

## 2022-11-05 NOTE — Patient Instructions (Signed)
Sleep with the head of your bed up. Take the furosemide every day--if the swelling doesn't improve, I will want you to try 2 tabs at the same time in the morning (40mg )

## 2022-11-19 ENCOUNTER — Ambulatory Visit: Payer: Medicare PPO | Admitting: Internal Medicine

## 2022-11-19 DIAGNOSIS — I10 Essential (primary) hypertension: Secondary | ICD-10-CM | POA: Diagnosis not present

## 2022-11-19 DIAGNOSIS — N1832 Chronic kidney disease, stage 3b: Secondary | ICD-10-CM | POA: Diagnosis not present

## 2022-11-19 DIAGNOSIS — R600 Localized edema: Secondary | ICD-10-CM | POA: Diagnosis not present

## 2022-11-19 DIAGNOSIS — E1122 Type 2 diabetes mellitus with diabetic chronic kidney disease: Secondary | ICD-10-CM | POA: Diagnosis not present

## 2022-11-19 DIAGNOSIS — N189 Chronic kidney disease, unspecified: Secondary | ICD-10-CM | POA: Diagnosis not present

## 2022-11-23 ENCOUNTER — Telehealth: Payer: Self-pay | Admitting: Emergency Medicine

## 2022-11-23 NOTE — Telephone Encounter (Signed)
Called patient and left a message for call back

## 2022-11-23 NOTE — Telephone Encounter (Signed)
-----   Message from Julien Nordmann sent at 11/21/2022  9:22 PM EDT ----- Marcelino Duster could we call her, We could certainly try diltiazem ER 120 mg daily, perhaps in the evening We would need to cut the losartan HCTZ in half daily, in the morning Would recommend she try to stay on the metoprolol, morning Certainly need the Eliquis twice a day if she is going to be a candidate for cardioversion down the road  Diltiazem has the potential to exacerbate the leg swelling so she will need compression hose, leg elevation Anemia may also be contributing to her leg swelling  If she can monitor blood pressure and pulse at home and call us with numbers  Thx tgollan ----- Message ----- From: Karie Schwalbe, MD Sent: 11/05/2022   4:33 PM EDT To: Antonieta Iba, MD  Tim, Her rate is still in the 120's. Didn't tolerate the metoprolol over 12.5. Should we consider digoxin (makes me nervous with her kidneys) or diltiazem?? Rih

## 2022-11-23 NOTE — Telephone Encounter (Signed)
Called and spoke with patient. Informed her of the following recommendations from Dr. Mariah Milling.   ----- Message from Julien Nordmann sent at 11/21/2022  9:22 PM EDT ----- Marcelino Duster could we call her, We could certainly try diltiazem ER 120 mg daily, perhaps in the evening We would need to cut the losartan HCTZ in half daily, in the morning Would recommend she try to stay on the metoprolol, morning Certainly need the Eliquis twice a day if she is going to be a candidate for cardioversion down the road   Diltiazem has the potential to exacerbate the leg swelling so she will need compression hose, leg elevation Anemia may also be contributing to her leg swelling   If she can monitor blood pressure and pulse at home and call us with numbers   Thx tgollan   Patient states that she does not feel like her heart rate is elevated. Patient also states that she wants to be seen by Dr. Mariah Milling before starting a new medication. Patient scheduled with Dr. Mariah Milling on 11/27/22. Patient encouraged to monitor blood pressures and heart rates and bring to appointment.

## 2022-11-27 ENCOUNTER — Encounter: Payer: Self-pay | Admitting: Cardiovascular Disease

## 2022-11-27 ENCOUNTER — Ambulatory Visit: Payer: Medicare PPO | Attending: Cardiovascular Disease | Admitting: Cardiovascular Disease

## 2022-11-27 VITALS — BP 140/80 | HR 87 | Ht 64.0 in | Wt 182.0 lb

## 2022-11-27 DIAGNOSIS — N1832 Chronic kidney disease, stage 3b: Secondary | ICD-10-CM | POA: Diagnosis not present

## 2022-11-27 DIAGNOSIS — Z8673 Personal history of transient ischemic attack (TIA), and cerebral infarction without residual deficits: Secondary | ICD-10-CM | POA: Diagnosis not present

## 2022-11-27 DIAGNOSIS — I10 Essential (primary) hypertension: Secondary | ICD-10-CM

## 2022-11-27 DIAGNOSIS — E1122 Type 2 diabetes mellitus with diabetic chronic kidney disease: Secondary | ICD-10-CM | POA: Diagnosis not present

## 2022-11-27 DIAGNOSIS — I5031 Acute diastolic (congestive) heart failure: Secondary | ICD-10-CM | POA: Diagnosis not present

## 2022-11-27 DIAGNOSIS — N182 Chronic kidney disease, stage 2 (mild): Secondary | ICD-10-CM | POA: Diagnosis not present

## 2022-11-27 DIAGNOSIS — I4819 Other persistent atrial fibrillation: Secondary | ICD-10-CM

## 2022-11-27 MED ORDER — APIXABAN 2.5 MG PO TABS: 2.5000 mg | ORAL_TABLET | Freq: Two times a day (BID) | ORAL | 3 refills | Status: DC

## 2022-11-27 MED ORDER — DILTIAZEM HCL 30 MG PO TABS: 30.0000 mg | ORAL_TABLET | Freq: Two times a day (BID) | ORAL | 3 refills | Status: DC

## 2022-11-27 MED ORDER — METOPROLOL SUCCINATE ER 25 MG PO TB24: 12.5000 mg | ORAL_TABLET | Freq: Every day | ORAL | 3 refills | Status: AC

## 2022-11-27 NOTE — Progress Notes (Signed)
Cardiology Office Note  Date:  11/27/2022   ID:  JAYCE LOAFMAN, DOB 1940/04/06, MRN [id number]  PCP:  Karie Schwalbe, MD   Chief Complaint  Patient presents with   1 month follow up     Patient can't take the Metoprolol twice daily due to brain fog. Medications reviewed by the patient verbally.     HPI:  [name] is a 82 year old woman with past medical history of Diabetes Hypertension Chronic kidney disease stage IIIb History of stroke September 2022, hemorrhagic felt secondary to hypertension History of falls Who presents for follow-up of her irregular heart rhythm/atrial fibrillation  Last seen by myself in clinic October 19, 2018 for atrial fibrillation EKG August 17, 2022 showing atrial fibrillation, with PMD Metoprolol succinate increased up to 25 twice daily Started on Eliquis 2.5 twice daily, reduced dose for chronic renal failure creatinine greater than 1.5 and age over 44  We received a phone call that she was having difficulty tolerating metoprolol over 12.5 daily With recommended to start diltiazem ER 120 daily in the evening with reduction in losartan HCTZ down in half, stay on metoprolol  In follow-up today she reports that she is feeling better She could not tolerate metoprolol succinate more than 12.5 mg daily secondary to brain fog at higher doses Family who presents with her also reports that she had difficulty tolerating Eliquis but has been able to now get used to this 2.5 mg twice daily  In NSR today, using Apple Watch has appreciated normal rhythm but does not use her watch regularly May be NSR for the past 10 days per family but no data available  Trouble sleeping at baseline Presenting again today in wheelchair  EKG personally reviewed by myself on todays visit EKG Interpretation Date/Time:  Friday November 27 2022 16:00:32 EDT Ventricular Rate:  89 PR Interval:    QRS Duration:  94 QT Interval:  378 QTC Calculation: 459 R  Axis:   -23  Text Interpretation: Normal sinus rhythm ST & T wave abnormality, consider lateral ischemia When compared with ECG of 19-Oct-2022 11:03, nsr has replaced Atrial fibrillation Vent. rate has decreased BY  45 BPM Confirmed by Julien Nordmann [id number]) on 11/27/2022 4:44:55 PM   Prior history reviewed History of hemorrhagic stroke September  2022 hit the back of her head due to a fall backwards in her wheelchair while her son was wheeling her down the steps. CT head in the ED showed a questionable right basal ganglia mass versus hemorrhage; therefore, an MRI was obtained. MRI revealed a subacute hemorrhagic lesion involving the right basal ganglia.  Subacute hemorrhagic stroke. MRI of the brain showing a 2.5 x 1.1 x 1.8 cm well-circumscribed lenticular lesion involving the right lenticular form nucleus.   Per Neurology October 30, 2020 "the hemorrhage seen on MRI appears likely to be several days if not over one week old. It does not appear likely to be secondary to trauma. It is most likely a hypertensive bleed. "  Echo 9/22: normal EF, normal LA size  PMH:   has a past medical history of Allergic rhinitis, Anal fissure, Arthritis, Asthma, Depression, Diabetes mellitus, Diverticulosis, Fibromyalgia, GERD (gastroesophageal reflux disease), Hypertension, Obesity, and Osteoarthritis.  PSH:    Past Surgical History:  Procedure Laterality Date   ABDOMINAL HYSTERECTOMY  1970's   CATARACT EXTRACTION, BILATERAL     CHOLECYSTECTOMY  4/06   TUBAL LIGATION  1970's   VENTRAL HERNIA REPAIR  9/08   [facility]  Current Outpatient Medications  Medication Sig Dispense Refill   Accu-Chek Softclix Lancets lancets Use to obtain blood sugar sample Dx Code E11.40 100 each 3   apixaban (ELIQUIS) 2.5 MG TABS tablet Take 1 tablet (2.5 mg total) by mouth 2 (two) times daily. 14 tablet 0   cholestyramine (QUESTRAN) 4 g packet Take 1 packet (4 g total) by mouth daily. 90 each 3   furosemide (LASIX) 20  MG tablet Take 1 tablet (20 mg total) by mouth daily. 90 tablet 3   glipiZIDE (GLUCOTROL XL) 2.5 MG 24 hr tablet Take 1 tablet (2.5 mg total) by mouth 2 (two) times daily. 28 tablet 0   losartan-hydrochlorothiazide (HYZAAR) 100-25 MG tablet TAKE 1 TABLET EVERY DAY 90 tablet 3   metFORMIN (GLUCOPHAGE-XR) 750 MG 24 hr tablet Take 1 tablet (750 mg total) by mouth daily with breakfast. TAKE 2 TABLETS EVERY DAY WITH BREAKFAST (Patient taking differently: Take 750 mg by mouth daily with breakfast.) 1 tablet 3   metoprolol succinate (TOPROL-XL) 25 MG 24 hr tablet Take 0.5 tablets (12.5 mg total) by mouth daily. 1 tablet 0   Multiple Vitamin (MULTIVITAMIN) tablet Take 1 tablet by mouth daily.     potassium chloride (KLOR-CON) 10 MEQ tablet Take 1 tablet (10 mEq total) by mouth daily. 90 tablet 3   triamcinolone cream (KENALOG) 0.1 % Apply 1 Application topically 2 (two) times daily as needed. 45 g 1   venlafaxine (EFFEXOR) 37.5 MG tablet Take 1 tablet (37.5 mg total) by mouth daily at 12 noon. 90 tablet 1   glucose blood (ACCU-CHEK GUIDE) test strip Use to check blood sugar once daily Dx Code E11.40 (Patient not taking: Reported on 11/27/2022) 100 each 3   No current facility-administered medications for this visit.    Allergies:   Diphenhydramine, Diphenhydramine hcl, Diphenhydramine hcl, Doxycycline, Lisinopril, Sertraline hcl, Sertraline hcl, Fluoxetine hcl, and Fluoxetine hcl   Social History:  The patient  reports that she has never smoked. She has been exposed to tobacco smoke. She has never used smokeless tobacco. She reports that she does not drink alcohol.   Family History:   family history includes Brain cancer in her daughter; Coronary artery disease in her father; Diabetes in an other family member; Heart attack in her father; Kidney cancer in her mother; Liver cancer in her brother; Lung cancer in her brother.    Review of Systems: Review of Systems  Constitutional:  Positive for  malaise/fatigue.  HENT: Negative.    Respiratory:  Positive for shortness of breath.   Cardiovascular:  Positive for leg swelling.  Gastrointestinal: Negative.   Musculoskeletal: Negative.   Neurological: Negative.   Psychiatric/Behavioral: Negative.    All other systems reviewed and are negative.   PHYSICAL EXAM: VS:  BP (!) 140/80 (BP Location: Left Arm, Patient Position: Sitting, Cuff Size: Normal)   Pulse 89   Ht 5\' 4"  (1.626 m)   Wt 182 lb (82.6 kg)   SpO2 97%   BMI 31.24 kg/m  , BMI Body mass index is 31.24 kg/m. GEN: Well nourished, well developed, in no acute distress HEENT: normal Neck: no JVD, carotid bruits, or masses Cardiac: Irregularly irregular; no murmurs, rubs, or gallops,no edema  Respiratory:  clear to auscultation bilaterally, normal work of breathing GI: soft, nontender, nondistended, + BS MS: no deformity or atrophy Skin: warm and dry, no rash Neuro:  Strength and sensation are intact Psych: euthymic mood, full affect  Recent Labs: 08/17/2022: ALT 10; BUN 27; Creatinine, Ser  1.62; Hemoglobin 9.5; Platelets 190.0; Potassium 4.8; Sodium 134; TSH 2.59    Lipid Panel Lab Results  Component Value Date   CHOL 107 08/17/2022   HDL 43.20 08/17/2022   LDLCALC 41 08/17/2022   TRIG 110.0 08/17/2022      Wt Readings from Last 3 Encounters:  11/27/22 182 lb (82.6 kg)  10/19/22 182 lb (82.6 kg)  08/17/22 179 lb (81.2 kg)     ASSESSMENT AND PLAN:  Problem List Items Addressed This Visit       Cardiology Problems   Atrial fibrillation (HCC) - Primary   Relevant Orders   EKG 12-Lead (Completed)   Acute diastolic heart failure (HCC)   Essential hypertension   Relevant Orders   EKG 12-Lead (Completed)     Other   Type 2 diabetes mellitus with stage 2 chronic kidney disease, without long-term current use of insulin (HCC)   Other Visit Diagnoses     Stage 3b chronic kidney disease (HCC)       History of stroke       Relevant Orders   EKG 12-Lead  (Completed)       Persistent atrial fibrillation In follow-up today she has converted to normal sinus rhythm Recommend she continue metoprolol succinate 12.5 daily Eliquis 2.5 daily We will add diltiazem 30 mg twice daily, reduced dose given her sensitivity to medications Likely having paroxysmal atrial fibrillation but for the most part is asymptomatic Recommend she use her Apple Watch to track her rhythm   lower extremity edema Improving lower extremity edema Recommend she continue Lasix daily, compression hose, leg elevation Venous Doppler performed October 15, 2022, no DVT  incidental finding of Baker's cyst on left  Chronic kidney disease stage IIIb Likely secondary to nephropathy in the setting of diabetes Creatinine 1.59 Followed by nephrology  Baker's cyst 4.7 cm on left, could be causing compression, may need to be drained if edema persists  Anemia on Eliquis, will need periodic CBC Order placed for lab work at her convenience  Long discussion with her concerning management of atrial fibrillation, medications  Signed, Dossie Arbour, M.D., Ph.D. [facility] [place], [place] [phone]

## 2022-11-27 NOTE — Patient Instructions (Signed)
Medication Instructions:  Please start diltiazem 30 mg twice a day  If you need a refill on your cardiac medications before your next appointment, please call your pharmacy.   Lab work: No new labs needed  Testing/Procedures: No new testing needed  Follow-Up: At [facility], you and your health needs are our priority.  As part of our continuing mission to provide you with exceptional heart care, we have created designated Provider Care Teams.  These Care Teams include your primary Cardiologist (physician) and Advanced Practice Providers (APPs -  Physician Assistants and Nurse Practitioners) who all work together to provide you with the care you need, when you need it.  You will need a follow up appointment in 6 months  Providers on your designated Care Team:   Nicolasa Ducking, NP Eula Listen, PA-C Cadence Fransico Michael, [place]  COVID-19 Vaccine Information can be found at: [email or web address] For questions related to vaccine distribution or appointments, please email [email or web address] or call [phone].

## 2022-11-30 ENCOUNTER — Ambulatory Visit: Payer: Medicare PPO | Admitting: Internal Medicine

## 2022-12-01 ENCOUNTER — Encounter: Payer: Self-pay | Admitting: Internal Medicine

## 2022-12-01 ENCOUNTER — Other Ambulatory Visit (HOSPITAL_COMMUNITY): Payer: Self-pay

## 2022-12-03 ENCOUNTER — Other Ambulatory Visit (HOSPITAL_COMMUNITY): Payer: Self-pay

## 2022-12-03 ENCOUNTER — Other Ambulatory Visit: Payer: Self-pay

## 2022-12-03 ENCOUNTER — Encounter: Payer: Self-pay | Admitting: Internal Medicine

## 2022-12-07 ENCOUNTER — Ambulatory Visit: Payer: Medicare PPO | Admitting: Cardiovascular Disease

## 2022-12-07 ENCOUNTER — Other Ambulatory Visit (HOSPITAL_COMMUNITY): Payer: Self-pay

## 2022-12-08 ENCOUNTER — Other Ambulatory Visit (HOSPITAL_COMMUNITY): Payer: Self-pay

## 2022-12-08 ENCOUNTER — Telehealth: Payer: Self-pay

## 2022-12-08 ENCOUNTER — Telehealth: Payer: Self-pay | Admitting: Internal Medicine

## 2022-12-08 MED ORDER — METFORMIN HCL ER 750 MG PO TB24: 750.0000 mg | ORAL_TABLET | Freq: Every day | ORAL | 3 refills | Status: AC

## 2022-12-08 NOTE — Telephone Encounter (Signed)
Rx sent electronically.  

## 2022-12-08 NOTE — Telephone Encounter (Signed)
Prescription Request  12/08/2022  LOV: 11/05/2022  What is the name of the medication or equipment? metFORMIN (GLUCOPHAGE-XR) 750 MG 24 hr tablet   Have you contacted your pharmacy to request a refill? Yes   Which pharmacy would you like this sent to?  [facility] - [place], [place] - [address] [place] [address] Phone: [phone] Fax: [phone]   Patient notified that their request is being sent to the clinical staff for review and that they should receive a response within 2 business days.   Please advise at [facility] [phone]

## 2022-12-08 NOTE — Telephone Encounter (Signed)
Pharmacy Patient Advocate Encounter   Received notification from CoverMyMeds that prior authorization for metFORMIN HCl ER 750MG  er tablets is required/requested.   Insurance verification completed.   The patient is insured through [employer] .   Per test claim: PA required; PA submitted to above mentioned insurance via CoverMyMeds Key/confirmation #/EOC [id number] Status is pending

## 2022-12-09 ENCOUNTER — Other Ambulatory Visit (HOSPITAL_COMMUNITY): Payer: Self-pay

## 2022-12-09 NOTE — Telephone Encounter (Signed)
Pharmacy Patient Advocate Encounter  Received notification from [facility] that Prior Authorization for metFORMIN HCl ER 750MG  er tablets has been APPROVED from 12/08/2022 to 02/09/2024. Ran test claim, Copay is $6.15 for a 90-day supply. This test claim was processed through [facility]- copay amounts may vary at other pharmacies due to pharmacy/plan contracts, or as the patient moves through the different stages of their insurance plan.   PA #/Case ID/Reference #:  [id number]  Pharmacy notified.

## 2023-01-14 ENCOUNTER — Other Ambulatory Visit: Payer: Self-pay | Admitting: Internal Medicine

## 2023-02-09 ENCOUNTER — Other Ambulatory Visit: Payer: Self-pay | Admitting: Internal Medicine

## 2023-02-24 DIAGNOSIS — I10 Essential (primary) hypertension: Secondary | ICD-10-CM | POA: Diagnosis not present

## 2023-02-24 DIAGNOSIS — R600 Localized edema: Secondary | ICD-10-CM | POA: Diagnosis not present

## 2023-02-24 DIAGNOSIS — N1832 Chronic kidney disease, stage 3b: Secondary | ICD-10-CM | POA: Diagnosis not present

## 2023-04-16 ENCOUNTER — Telehealth: Payer: Self-pay | Admitting: Cardiovascular Disease

## 2023-04-16 NOTE — Telephone Encounter (Signed)
 Pt c/o medication issue:  1. Name of Medication:   apixaban (ELIQUIS) 2.5 MG TABS tablet    2. How are you currently taking this medication (dosage and times per day)? As written   3. Are you having a reaction (difficulty breathing--STAT)? No   4. What is your medication issue? Pt called in stating her insurance is now charging her over $300 for this med as a fee and an additional $100. Pt states she is unable to afford and would like to discuss her options, please advise.

## 2023-04-16 NOTE — Telephone Encounter (Signed)
 Called and spoke with patient. Patient states that she and her son have contacted her insurance company in regards to Eliquis. She states that the Eliquis is to expensive for her. She states that she does not qualify for patient assistance. Patient requesting an alternative medication.

## 2023-04-22 NOTE — Telephone Encounter (Signed)
Patient was calling back for update. Please advise

## 2023-04-22 NOTE — Telephone Encounter (Signed)
 The patient is calling back regarding Dr. Windell Hummingbird recommendations. She stated that she cannot afford Eliquis and does not qualify for patient assistance. The patient mentioned she currently only has 10 pills left. The nurse advised the patient to contact the office on Monday for samples if we have not received a response, as Dr. Mariah Milling is currently out of the office.  Pt verbalized understanding

## 2023-04-23 MED ORDER — APIXABAN 2.5 MG PO TABS: 2.5000 mg | ORAL_TABLET | Freq: Two times a day (BID) | ORAL | Status: AC

## 2023-04-23 NOTE — Telephone Encounter (Signed)
 Received the following from Dr. Mariah Milling.  [facility] was offering a different blood thinner for reduced price  Would recommend we call him to find cash pay price on Pradaxa  Recently heard it was $50  She could call her insurance company see if Xarelto 15 mg might be cheaper  Perhaps we can get our pharmacist to help  Warfarin would be a last option  Thx  [facility] does not have a reduced price on Pradaxa. Patient will call insurance to find out the price on Xarelto. Elquis samples placed in sample closet for patient to pick up.

## 2023-06-21 NOTE — Progress Notes (Unsigned)
 Cardiology Office Note  Date:  06/22/2023   ID:  MILANNA Mccarty, DOB [birth year], MRN [id number]  PCP:  Anna Llanos, MD   Chief Complaint  Patient presents with   6 month follow up     "Doing well." Patient c/o cramps in legs; she recently cut the Lasix  to every other day.     HPI:  [name] is a 83 year old woman with past medical history of Diabetes Hypertension Chronic kidney disease stage IIIb History of stroke September 2022, hemorrhagic felt secondary to hypertension History of falls Who presents for follow-up of her paroxysmal atrial fibrillation  Last seen by myself in clinic 10/24 for atrial fibrillation In follow-up today she reports doing relatively well She was getting leg cramps, cut back on her Lasix  to every other day Continues on losartan  hydrochlorothiazide  daily Cramps have improved Presents today in a wheelchair with her son Compliant with her Eliquis  twice daily Reports tolerating diltiazem  30 twice daily with metoprolol  succinate 12.5 daily  Denies shortness of breath or chest pain  EKG personally reviewed by myself on todays visit EKG Interpretation Date/Time:  Tuesday Jun 22 2023 16:45:43 EDT Ventricular Rate:  83 PR Interval:  184 QRS Duration:  104 QT Interval:  384 QTC Calculation: 451 R Axis:   -18  Text Interpretation: Normal sinus rhythm Minimal voltage criteria for LVH, may be normal variant ( Cornell product ) When compared with ECG of 22-Jun-2023 16:43, No significant change was found Reconfirmed by [name] [phone]) on 06/22/2023 5:26:41 PM     EKG August 17, 2022 showing atrial fibrillation, with PMD Metoprolol  succinate increased up to 25 twice daily Started on Eliquis  2.5 twice daily, reduced dose for chronic renal failure creatinine greater than 1.5 and age over 5  difficulty tolerating metoprolol  over 12.5 daily Did not tolerate diltiazem  ER 120 daily   History of hemorrhagic stroke September  2022 hit the  back of her head due to a fall backwards in her wheelchair while her son was wheeling her down the steps. CT head in the ED showed a questionable right basal ganglia mass versus hemorrhage; therefore, an MRI was obtained. MRI revealed a subacute hemorrhagic lesion involving the right basal ganglia.  Subacute hemorrhagic stroke. MRI of the brain showing a 2.5 x 1.1 x 1.8 cm well-circumscribed lenticular lesion involving the right lenticular form nucleus.   Per Neurology October 30, 2020 "the hemorrhage seen on MRI appears likely to be several days if not over one week old. It does not appear likely to be secondary to trauma. It is most likely a hypertensive bleed. "  Echo 9/22: normal EF, normal LA size  PMH:   has a past medical history of Allergic rhinitis, Anal fissure, Arthritis, Asthma, Depression, Diabetes mellitus, Diverticulosis, Fibromyalgia, GERD (gastroesophageal reflux disease), Hypertension, Obesity, and Osteoarthritis.  PSH:    Past Surgical History:  Procedure Laterality Date   ABDOMINAL HYSTERECTOMY  1970's   CATARACT EXTRACTION, BILATERAL     CHOLECYSTECTOMY  4/06   TUBAL LIGATION  1970's   VENTRAL HERNIA REPAIR  9/08   [facility]    Current Outpatient Medications  Medication Sig Dispense Refill   Accu-Chek Softclix Lancets lancets Use to obtain blood sugar sample Dx Code E11.40 100 each 3   apixaban  (ELIQUIS ) 2.5 MG TABS tablet Take 1 tablet (2.5 mg total) by mouth 2 (two) times daily. 28 tablet    cholestyramine  (QUESTRAN ) 4 g packet Take 1 packet (4 g total) by mouth daily. 90  each 3   diltiazem  (CARDIZEM ) 30 MG tablet Take 1 tablet (30 mg total) by mouth 2 (two) times daily. 180 tablet 3   furosemide  (LASIX ) 20 MG tablet Take 20 mg by mouth every other day.     glipiZIDE  (GLUCOTROL  XL) 2.5 MG 24 hr tablet Take 1 tablet (2.5 mg total) by mouth 2 (two) times daily. 28 tablet 0   Glucosamine-Chondroit-Biofl-Mn (GLUCOSAMINE CHONDROIT,BIOFLAV, PO) Take by mouth daily.      losartan -hydrochlorothiazide  (HYZAAR) 100-25 MG tablet TAKE 1 TABLET EVERY DAY 90 tablet 3   metFORMIN  (GLUCOPHAGE -XR) 750 MG 24 hr tablet Take 1 tablet (750 mg total) by mouth daily with breakfast. 180 tablet 3   metoprolol  succinate (TOPROL -XL) 25 MG 24 hr tablet Take 0.5 tablets (12.5 mg total) by mouth daily. 90 tablet 3   Multiple Vitamin (MULTIVITAMIN) tablet Take 1 tablet by mouth daily.     potassium chloride  (KLOR-CON ) 10 MEQ tablet Take 1 tablet (10 mEq total) by mouth daily. 90 tablet 3   triamcinolone  cream (KENALOG ) 0.1 % Apply 1 Application topically 2 (two) times daily as needed. 45 g 1   venlafaxine  (EFFEXOR ) 37.5 MG tablet TAKE 1 TABLET EVERY DAY AT 12 NOON 90 tablet 3   Vitamin D -Vitamin K (VITAMIN K2-VITAMIN D3 PO) Take by mouth daily.     glucose blood (ACCU-CHEK GUIDE) test strip Use to check blood sugar once daily Dx Code E11.40 (Patient not taking: Reported on 11/27/2022) 100 each 3   No current facility-administered medications for this visit.    Allergies:   Diphenhydramine, Diphenhydramine hcl, Diphenhydramine hcl, Doxycycline , Lisinopril, Sertraline hcl, Sertraline hcl, Fluoxetine hcl, and Fluoxetine hcl   Social History:  The patient  reports that she has never smoked. She has been exposed to tobacco smoke. She has never used smokeless tobacco. She reports that she does not drink alcohol.   Family History:   family history includes Brain cancer in her daughter; Coronary artery disease in her father; Diabetes in an other family member; Heart attack in her father; Kidney cancer in her mother; Liver cancer in her brother; Lung cancer in her brother.    Review of Systems: Review of Systems  Constitutional:  Positive for malaise/fatigue.  HENT: Negative.    Respiratory:  Positive for shortness of breath.   Cardiovascular:  Positive for leg swelling.  Gastrointestinal: Negative.   Musculoskeletal: Negative.   Neurological: Negative.   Psychiatric/Behavioral:  Negative.    All other systems reviewed and are negative.   PHYSICAL EXAM: VS:  BP 132/70 (BP Location: Left Arm, Patient Position: Sitting, Cuff Size: Normal)   Pulse 83   Ht 5\' 5"  (1.651 m)   Wt 184 lb (83.5 kg)   SpO2 95%   BMI 30.62 kg/m  , BMI Body mass index is 30.62 kg/m. Constitutional:  oriented to person, place, and time. No distress.  HENT:  Head: Grossly normal Eyes:  no discharge. No scleral icterus.  Neck: No JVD, no carotid bruits  Cardiovascular: Regular rate and rhythm, no murmurs appreciated Pulmonary/Chest: Clear to auscultation bilaterally, no wheezes or rails Abdominal: Soft.  no distension.  no tenderness.  Musculoskeletal: Normal range of motion Neurological:  normal muscle tone. Coordination normal. No atrophy Skin: Skin warm and dry Psychiatric: normal affect, pleasant  Recent Labs: 08/17/2022: ALT 10; BUN 27; Creatinine, Ser 1.62; Hemoglobin 9.5; Platelets 190.0; Potassium 4.8; Sodium 134; TSH 2.59   Lipid Panel Lab Results  Component Value Date   CHOL 107 08/17/2022   HDL  43.20 08/17/2022   LDLCALC 41 08/17/2022   TRIG 110.0 08/17/2022    Wt Readings from Last 3 Encounters:  06/22/23 184 lb (83.5 kg)  11/27/22 182 lb (82.6 kg)  10/19/22 182 lb (82.6 kg)    ASSESSMENT AND PLAN:  Problem List Items Addressed This Visit       Cardiology Problems   Atrial fibrillation (HCC) - Primary   Relevant Medications   furosemide  (LASIX ) 20 MG tablet   Other Relevant Orders   EKG 12-Lead (Completed)   Acute diastolic heart failure (HCC)   Relevant Medications   furosemide  (LASIX ) 20 MG tablet   Other Relevant Orders   EKG 12-Lead (Completed)   Essential hypertension   Relevant Medications   furosemide  (LASIX ) 20 MG tablet   Other Relevant Orders   EKG 12-Lead (Completed)     Other   Type 2 diabetes mellitus with stage 2 chronic kidney disease, without long-term current use of insulin  (HCC)   Other Visit Diagnoses       Stage 3b chronic  kidney disease (HCC)         History of stroke           Persistent atrial fibrillation For the most part asymptomatic atrial fibrillation, paroxysmal  continue metoprolol  succinate 12.5 daily Eliquis  2.5 daily Continue diltiazem  30 mg twice daily,    lower extremity edema Lower extremity edema improved on Lasix  every other day, losartan  HCTZ daily She has follow-up lab work with nephrology Venous Doppler performed October 15, 2022, no DVT  incidental finding of Baker's cyst on left  Chronic kidney disease stage IIIb  nephropathy in the setting of diabetes Followed by nephrology  Baker's cyst 4.7 cm on left, previously noted Denies significant pain on today's visit  Anemia on Eliquis  Recommend repeat CBC when she sees nephrology  Signed, Juanda Noon, M.D., Ph.D. [facility] [place], [place] [phone]

## 2023-06-22 ENCOUNTER — Encounter: Payer: Self-pay | Admitting: Cardiovascular Disease

## 2023-06-22 ENCOUNTER — Ambulatory Visit: Attending: Cardiovascular Disease | Admitting: Cardiovascular Disease

## 2023-06-22 ENCOUNTER — Encounter: Payer: Self-pay | Admitting: Internal Medicine

## 2023-06-22 VITALS — BP 132/70 | HR 83 | Ht 65.0 in | Wt 184.0 lb

## 2023-06-22 DIAGNOSIS — I5031 Acute diastolic (congestive) heart failure: Secondary | ICD-10-CM | POA: Diagnosis not present

## 2023-06-22 DIAGNOSIS — Z8673 Personal history of transient ischemic attack (TIA), and cerebral infarction without residual deficits: Secondary | ICD-10-CM

## 2023-06-22 DIAGNOSIS — E1122 Type 2 diabetes mellitus with diabetic chronic kidney disease: Secondary | ICD-10-CM | POA: Diagnosis not present

## 2023-06-22 DIAGNOSIS — I10 Essential (primary) hypertension: Secondary | ICD-10-CM | POA: Diagnosis not present

## 2023-06-22 DIAGNOSIS — N182 Chronic kidney disease, stage 2 (mild): Secondary | ICD-10-CM | POA: Diagnosis not present

## 2023-06-22 DIAGNOSIS — N1832 Chronic kidney disease, stage 3b: Secondary | ICD-10-CM | POA: Diagnosis not present

## 2023-06-22 DIAGNOSIS — I4819 Other persistent atrial fibrillation: Secondary | ICD-10-CM | POA: Diagnosis not present

## 2023-06-22 NOTE — Patient Instructions (Signed)

## 2023-07-13 ENCOUNTER — Other Ambulatory Visit: Payer: Self-pay | Admitting: Internal Medicine

## 2023-08-16 DIAGNOSIS — I10 Essential (primary) hypertension: Secondary | ICD-10-CM | POA: Diagnosis not present

## 2023-08-16 DIAGNOSIS — N189 Chronic kidney disease, unspecified: Secondary | ICD-10-CM | POA: Diagnosis not present

## 2023-08-16 DIAGNOSIS — E1122 Type 2 diabetes mellitus with diabetic chronic kidney disease: Secondary | ICD-10-CM | POA: Diagnosis not present

## 2023-08-16 DIAGNOSIS — E790 Hyperuricemia without signs of inflammatory arthritis and tophaceous disease: Secondary | ICD-10-CM | POA: Diagnosis not present

## 2023-08-16 DIAGNOSIS — N184 Chronic kidney disease, stage 4 (severe): Secondary | ICD-10-CM | POA: Diagnosis not present

## 2023-08-16 DIAGNOSIS — R600 Localized edema: Secondary | ICD-10-CM | POA: Diagnosis not present

## 2023-08-16 DIAGNOSIS — D631 Anemia in chronic kidney disease: Secondary | ICD-10-CM | POA: Diagnosis not present

## 2023-09-20 ENCOUNTER — Inpatient Hospital Stay

## 2023-09-20 ENCOUNTER — Other Ambulatory Visit: Payer: Self-pay

## 2023-09-20 ENCOUNTER — Encounter: Payer: Self-pay | Admitting: Oncology

## 2023-09-20 ENCOUNTER — Inpatient Hospital Stay: Attending: Oncology | Admitting: Oncology

## 2023-09-20 VITALS — BP 137/71 | HR 95 | Temp 97.5°F | Resp 16

## 2023-09-20 DIAGNOSIS — Z801 Family history of malignant neoplasm of trachea, bronchus and lung: Secondary | ICD-10-CM | POA: Insufficient documentation

## 2023-09-20 DIAGNOSIS — N189 Chronic kidney disease, unspecified: Secondary | ICD-10-CM | POA: Diagnosis not present

## 2023-09-20 DIAGNOSIS — D631 Anemia in chronic kidney disease: Secondary | ICD-10-CM | POA: Insufficient documentation

## 2023-09-20 DIAGNOSIS — Z8051 Family history of malignant neoplasm of kidney: Secondary | ICD-10-CM | POA: Insufficient documentation

## 2023-09-20 DIAGNOSIS — Z79899 Other long term (current) drug therapy: Secondary | ICD-10-CM | POA: Insufficient documentation

## 2023-09-20 DIAGNOSIS — N1832 Chronic kidney disease, stage 3b: Secondary | ICD-10-CM | POA: Insufficient documentation

## 2023-09-20 DIAGNOSIS — Z808 Family history of malignant neoplasm of other organs or systems: Secondary | ICD-10-CM | POA: Diagnosis not present

## 2023-09-20 LAB — IRON AND TIBC
Iron: 92 ug/dL (ref 28–170)
Saturation Ratios: 24 % (ref 10.4–31.8)
TIBC: 382 ug/dL (ref 250–450)
UIBC: 290 ug/dL

## 2023-09-20 LAB — CBC (CANCER CENTER ONLY)
HCT: 22.8 % — ABNORMAL LOW (ref 36.0–46.0)
Hemoglobin: 7.4 g/dL — ABNORMAL LOW (ref 12.0–15.0)
MCH: 31.6 pg (ref 26.0–34.0)
MCHC: 32.5 g/dL (ref 30.0–36.0)
MCV: 97.4 fL (ref 80.0–100.0)
Platelet Count: 251 K/uL (ref 150–400)
RBC: 2.34 MIL/uL — ABNORMAL LOW (ref 3.87–5.11)
RDW: 15.3 % (ref 11.5–15.5)
WBC Count: 12.6 K/uL — ABNORMAL HIGH (ref 4.0–10.5)
nRBC: 0 % (ref 0.0–0.2)

## 2023-09-20 LAB — LACTATE DEHYDROGENASE: LDH: 125 U/L (ref 98–192)

## 2023-09-20 LAB — FERRITIN: Ferritin: 28 ng/mL (ref 11–307)

## 2023-09-20 LAB — DIRECT ANTIGLOBULIN TEST (NOT AT ARMC)
DAT, IgG: NEGATIVE
DAT, complement: NEGATIVE

## 2023-09-20 LAB — VITAMIN B12: Vitamin B-12: 344 pg/mL (ref 180–914)

## 2023-09-20 LAB — FOLATE: Folate: >40 ng/mL (ref >5.9)

## 2023-09-20 NOTE — Progress Notes (Signed)
  [facility]  [phone] [phone]  ID: [name] OB: 1940/02/18  MR#: [id number]  RDW#:[id number]  Patient Care Team: Jimmy Charlie FERNS, MD as PCP - General Jacobo Evalene PARAS, MD as Consulting Physician (Oncology)  CHIEF COMPLAINT: Anemia and chronic renal insufficiency  INTERVAL HISTORY: Patient is an 83 year old female with a history of chronic anemia secondary to renal insufficiency who was noted to have further declining hemoglobin.  She is referred for evaluation and consideration of Retacrit .  She has chronic weakness and fatigue, but otherwise feels well.  She has no neurologic complaints.  She denies any recent fevers or illnesses.  She has a good appetite and denies weight loss.  She has no chest pain, shortness of breath, cough, or hemoptysis.  She denies any nausea, vomiting, constipation, or diarrhea.  She has no melena or hematochezia.  She has no urinary complaints.  Patient offers no further specific complaints today.  REVIEW OF SYSTEMS:   Review of Systems  Constitutional:  Positive for malaise/fatigue. Negative for chills and fever.  Respiratory: Negative.  Negative for cough, hemoptysis and shortness of breath.   Cardiovascular: Negative.  Negative for chest pain and leg swelling.  Gastrointestinal: Negative.  Negative for abdominal pain, blood in stool and melena.  Genitourinary: Negative.  Negative for dysuria.  Musculoskeletal: Negative.  Negative for back pain.  Skin: Negative.  Negative for rash.  Neurological:  Positive for weakness. Negative for dizziness, focal weakness and headaches.  Psychiatric/Behavioral: Negative.  The patient is not nervous/anxious.     As per HPI. Otherwise, a complete review of systems is negative.  PAST MEDICAL HISTORY: Past Medical History:  Diagnosis Date   Allergic rhinitis    Anal fissure    Arthritis    Asthma    Depression    Diabetes mellitus    type 2   Diverticulosis     Fibromyalgia    GERD (gastroesophageal reflux disease)    Hypertension    Obesity    Osteoarthritis     PAST SURGICAL HISTORY: Past Surgical History:  Procedure Laterality Date   ABDOMINAL HYSTERECTOMY  1970's   CATARACT EXTRACTION, BILATERAL     CHOLECYSTECTOMY  4/06   TUBAL LIGATION  1970's   VENTRAL HERNIA REPAIR  9/08   [facility]    FAMILY HISTORY: Family History  Problem Relation Age of Onset   Heart attack Father    Coronary artery disease Father    Kidney cancer Mother    Lung cancer Brother    Liver cancer Brother    Diabetes Other        Maternal side   Brain cancer Daughter    Hypertension Neg Hx    Breast cancer Neg Hx    Colon cancer Neg Hx     ADVANCED DIRECTIVES (Y/N):  N  HEALTH MAINTENANCE: Social History   Tobacco Use   Smoking status: Never    Passive exposure: Past   Smokeless tobacco: Never  Vaping Use   Vaping status: Never Used  Substance Use Topics   Alcohol use: No    Alcohol/week: 0.0 standard drinks of alcohol     Colonoscopy:  PAP:  Bone density:  Lipid panel:  Allergies  Allergen Reactions   Diphenhydramine Other (See Comments)    hallucinations   Diphenhydramine Hcl Other (See Comments)    hallucinations    Diphenhydramine Hcl Other (See Comments)    Other reaction(s): Other (See Comments)  hallucinations   Doxycycline  Other (See Comments)  GI side effects   GI side effects     GI side effects   Lisinopril Other (See Comments)    REACTION: dizzy  REACTION: dizzy  REACTION: dizzy  REACTION: dizzy REACTION: dizzy    REACTION: dizzy   Sertraline Hcl Other (See Comments)    REACTION: dizziness REACTION: dizziness    REACTION: dizziness   Sertraline Hcl Other (See Comments)    REACTION: dizziness  REACTION: dizziness   Fluoxetine Hcl Palpitations    REACTION: raised blood pressure REACTION: raised blood pressure    REACTION: raised blood pressure   Fluoxetine Hcl Palpitations    REACTION: raised  blood pressure  REACTION: raised blood pressure    Current Outpatient Medications  Medication Sig Dispense Refill   Accu-Chek Softclix Lancets lancets Use to obtain blood sugar sample Dx Code E11.40 100 each 3   apixaban  (ELIQUIS ) 2.5 MG TABS tablet Take 1 tablet (2.5 mg total) by mouth 2 (two) times daily. 28 tablet    cholestyramine  (QUESTRAN ) 4 g packet Take 1 packet (4 g total) by mouth daily. 90 each 3   dapagliflozin  propanediol (FARXIGA ) 10 MG TABS tablet Take 10 mg by mouth.     diltiazem  (CARDIZEM ) 30 MG tablet Take 1 tablet (30 mg total) by mouth 2 (two) times daily. 180 tablet 3   glipiZIDE  (GLUCOTROL  XL) 2.5 MG 24 hr tablet Take 1 tablet (2.5 mg total) by mouth 2 (two) times daily. NEEDS OFFICE VISIT 180 tablet 0   Glucosamine-Chondroit-Biofl-Mn (GLUCOSAMINE CHONDROIT,BIOFLAV, PO) Take by mouth daily.     glucose blood (ACCU-CHEK GUIDE) test strip Use to check blood sugar once daily Dx Code E11.40 100 each 3   losartan -hydrochlorothiazide  (HYZAAR) 100-25 MG tablet TAKE 1 TABLET EVERY DAY 90 tablet 3   metFORMIN  (GLUCOPHAGE -XR) 750 MG 24 hr tablet Take 1 tablet (750 mg total) by mouth daily with breakfast. 180 tablet 3   metoprolol  succinate (TOPROL -XL) 25 MG 24 hr tablet Take 0.5 tablets (12.5 mg total) by mouth daily. 90 tablet 3   Multiple Vitamin (MULTIVITAMIN) tablet Take 1 tablet by mouth daily.     potassium chloride  (KLOR-CON ) 10 MEQ tablet Take 1 tablet (10 mEq total) by mouth daily. 90 tablet 3   venlafaxine  (EFFEXOR ) 37.5 MG tablet TAKE 1 TABLET EVERY DAY AT 12 NOON 90 tablet 3   Vitamin D -Vitamin K (VITAMIN K2-VITAMIN D3 PO) Take by mouth daily.     furosemide  (LASIX ) 20 MG tablet Take 20 mg by mouth every other day. (Patient not taking: Reported on 09/20/2023)     triamcinolone  cream (KENALOG ) 0.1 % Apply 1 Application topically 2 (two) times daily as needed. (Patient not taking: Reported on 09/20/2023) 45 g 1   No current facility-administered medications for this  visit.    OBJECTIVE: Vitals:   09/20/23 1340  BP: 137/71  Pulse: 95  Resp: 16  Temp: (!) 97.5 F (36.4 C)  SpO2: 99%     There is no height or weight on file to calculate BMI.    ECOG FS:1 - Symptomatic but completely ambulatory  General: Well-developed, well-nourished, no acute distress.  Sitting in a wheelchair. Eyes: Pink conjunctiva, anicteric sclera. HEENT: Normocephalic, moist mucous membranes. Lungs: No audible wheezing or coughing. Heart: Regular rate and rhythm. Abdomen: Soft, nontender, no obvious distention. Musculoskeletal: No edema, cyanosis, or clubbing. Neuro: Alert, answering all questions appropriately. Cranial nerves grossly intact. Skin: No rashes or petechiae noted. Psych: Normal affect. Lymphatics: No cervical, calvicular, axillary or inguinal LAD.  LAB RESULTS:  Lab Results  Component Value Date   NA 134 (L) 08/17/2022   K 4.8 08/17/2022   CL 100 08/17/2022   CO2 26 08/17/2022   GLUCOSE 173 (H) 08/17/2022   BUN 27 (H) 08/17/2022   CREATININE 1.62 (H) 08/17/2022   CALCIUM 9.3 08/17/2022   PROT 7.2 08/17/2022   ALBUMIN 3.8 08/17/2022   ALBUMIN 3.8 08/17/2022   AST 17 08/17/2022   ALT 10 08/17/2022   ALKPHOS 54 08/17/2022   BILITOT 0.6 08/17/2022   GFRNONAA >60 11/06/2020   GFRAA 81 11/18/2007    Lab Results  Component Value Date   WBC 12.6 (H) 09/20/2023   NEUTROABS 6.5 10/29/2020   HGB 7.4 (L) 09/20/2023   HCT 22.8 (L) 09/20/2023   MCV 97.4 09/20/2023   PLT 251 09/20/2023   Lab Results  Component Value Date   IRON 92 09/20/2023   TIBC 382 09/20/2023   IRONPCTSAT 24 09/20/2023   Lab Results  Component Value Date   FERRITIN 28 09/20/2023     STUDIES: No results found.  ASSESSMENT: Anemia secondary to chronic renal insufficiency.  PLAN:    Anemia secondary to chronic renal insufficiency: Patient's hemoglobin has declined to 7.5 with normal iron stores.  B12, folate, and hemolysis labs are all within normal limits.  SPEP  and IntelliGEN myeloid panel are pending at time of dictation.  Patient will benefit from 40,000 units of Retacrit  and return to clinic later this week for treatment.  She will then return to clinic every 4 weeks for laboratory work and Retacrit  if her hemoglobin remains below 10.0.  She will then return to clinic in 4 months for further evaluation and continuation of Retacrit  if necessary. Renal insufficiency: Chronic and unchanged.  Continue monitoring treatment per nephrology.  I spent a total of 45 minutes reviewing chart data, face-to-face evaluation with the patient, counseling and coordination of care as detailed above.  Patient expressed understanding and was in agreement with this plan. She also understands that She can call clinic at any time with any questions, concerns, or complaints.     Evalene JINNY Reusing, MD   09/21/2023 6:27 AM

## 2023-09-21 ENCOUNTER — Encounter: Payer: Self-pay | Admitting: Oncology

## 2023-09-21 DIAGNOSIS — D631 Anemia in chronic kidney disease: Secondary | ICD-10-CM | POA: Insufficient documentation

## 2023-09-21 DIAGNOSIS — N184 Chronic kidney disease, stage 4 (severe): Secondary | ICD-10-CM | POA: Insufficient documentation

## 2023-09-21 LAB — PROTEIN ELECTROPHORESIS, SERUM
A/G Ratio: 0.8 (ref 0.7–1.7)
Albumin ELP: 3 g/dL (ref 2.9–4.4)
Alpha-1-Globulin: 0.3 g/dL (ref 0.0–0.4)
Alpha-2-Globulin: 0.8 g/dL (ref 0.4–1.0)
Beta Globulin: 1.2 g/dL (ref 0.7–1.3)
Gamma Globulin: 1.5 g/dL (ref 0.4–1.8)
Globulin, Total: 3.9 g/dL (ref 2.2–3.9)
Total Protein ELP: 6.9 g/dL (ref 6.0–8.5)

## 2023-09-21 LAB — HAPTOGLOBIN: Haptoglobin: 197 mg/dL (ref 41–333)

## 2023-09-21 LAB — ERYTHROPOIETIN: Erythropoietin: 43.3 m[IU]/mL — ABNORMAL HIGH (ref 2.6–18.5)

## 2023-09-22 ENCOUNTER — Ambulatory Visit

## 2023-09-24 ENCOUNTER — Inpatient Hospital Stay

## 2023-09-24 VITALS — BP 149/78

## 2023-09-24 DIAGNOSIS — N1832 Chronic kidney disease, stage 3b: Secondary | ICD-10-CM | POA: Diagnosis not present

## 2023-09-24 DIAGNOSIS — D631 Anemia in chronic kidney disease: Secondary | ICD-10-CM

## 2023-09-24 DIAGNOSIS — K611 Rectal abscess: Secondary | ICD-10-CM | POA: Diagnosis not present

## 2023-09-24 DIAGNOSIS — K649 Unspecified hemorrhoids: Secondary | ICD-10-CM | POA: Diagnosis not present

## 2023-09-24 MED ORDER — EPOETIN ALFA-EPBX 40000 UNIT/ML IJ SOLN
40000.0000 [IU] | Freq: Once | INTRAMUSCULAR | Status: AC
  Administered 2023-09-24: 40000 [IU] via SUBCUTANEOUS
  Filled 2023-09-24: qty 1

## 2023-10-09 ENCOUNTER — Other Ambulatory Visit: Payer: Self-pay | Admitting: Cardiovascular Disease

## 2023-10-09 ENCOUNTER — Other Ambulatory Visit: Payer: Self-pay | Admitting: Internal Medicine

## 2023-10-12 NOTE — Telephone Encounter (Signed)
 Pt is due for yearly visit. Needs TOC with provider. Please help her get scheduled. Thanks.

## 2023-10-13 NOTE — Telephone Encounter (Signed)
 I called and left a voicemail for patient to be scheduled for a yearly visit and transfer of care appt.

## 2023-10-14 ENCOUNTER — Ambulatory Visit

## 2023-10-14 VITALS — BP 137/71 | Ht 65.0 in | Wt 184.0 lb

## 2023-10-14 DIAGNOSIS — Z0000 Encounter for general adult medical examination without abnormal findings: Secondary | ICD-10-CM | POA: Diagnosis not present

## 2023-10-15 NOTE — Patient Instructions (Signed)
 [name],  Thank you for taking the time for your Medicare Wellness Visit. I appreciate your continued commitment to your health goals. Please review the care plan we discussed, and feel free to reach out if I can assist you further.  Medicare recommends these wellness visits once per year to help you and your care team stay ahead of potential health issues. These visits are designed to focus on prevention, allowing your provider to concentrate on managing your acute and chronic conditions during your regular appointments.  Please note that Annual Wellness Visits do not include a physical exam. Some assessments may be limited, especially if the visit was conducted virtually. If needed, we may recommend a separate in-person follow-up with your provider.  Ongoing Care Seeing your primary care provider every 3 to 6 months helps us  monitor your health and provide consistent, personalized care.   Referrals If a referral was made during today's visit and you haven't received any updates within two weeks, please contact the referred provider directly to check on the status.  Recommended Screenings:  Health Maintenance  Topic Date Due   Zoster (Shingles) Vaccine (1 of 2) 08/29/1960   Yearly kidney health urinalysis for diabetes  05/18/2009   DTaP/Tdap/Td vaccine (2 - Tdap) 04/09/2015   Eye exam for diabetics  06/12/2015   Complete foot exam   05/07/2021   Hemoglobin A1C  02/17/2023   Yearly kidney function blood test for diabetes  08/17/2023   Flu Shot  09/10/2023   Medicare Annual Wellness Visit  10/13/2024   Pneumococcal Vaccine for age over 29  Completed   DEXA scan (bone density measurement)  Completed   HPV Vaccine  Aged Out   Meningitis B Vaccine  Aged Out   COVID-19 Vaccine  Discontinued       10/14/2023    3:48 PM  Advanced Directives  Does Patient Have a Medical Advance Directive? No  Would patient like information on creating a medical advance directive? No - Patient  declined   Advance Care Planning is important because it: Ensures you receive medical care that aligns with your values, goals, and preferences. Provides guidance to your family and loved ones, reducing the emotional burden of decision-making during critical moments.  Vision: Annual vision screenings are recommended for early detection of glaucoma, cataracts, and diabetic retinopathy. These exams can also reveal signs of chronic conditions such as diabetes and high blood pressure.  Dental: Annual dental screenings help detect early signs of oral cancer, gum disease, and other conditions linked to overall health, including heart disease and diabetes.  Please see the attached documents for additional preventive care recommendations.

## 2023-10-15 NOTE — Progress Notes (Signed)
 Because this visit was a virtual/telehealth visit,  certain criteria was not obtained, such a blood pressure, CBG if applicable, and timed get up and go. Any medications not marked as taking were not mentioned during the medication reconciliation part of the visit. Any vitals not documented were not able to be obtained due to this being a telehealth visit or patient was unable to self-report a recent blood pressure reading due to a lack of equipment at home via telehealth. Vitals that have been documented are verbally provided by the patient.  This visit was performed by a medical professional under my direct supervision. I was immediately available for consultation/collaboration. I have reviewed and agree with the Annual Wellness Visit documentation.  Subjective:   KHYLAH Mccarty is a 83 y.o. who presents for a Medicare Wellness preventive visit.  As a reminder, Annual Wellness Visits don't include a physical exam, and some assessments may be limited, especially if this visit is performed virtually. We may recommend an in-person follow-up visit with your provider if needed.  Visit Complete: Virtual I connected with  [name] on 10/15/23 by a audio enabled telemedicine application and verified that I am speaking with the correct person using two identifiers.  Patient Location: Home  Provider Location: Home Office  I discussed the limitations of evaluation and management by telemedicine. The patient expressed understanding and agreed to proceed.  Vital Signs: Because this visit was a virtual/telehealth visit, some criteria may be missing or patient reported. Any vitals not documented were not able to be obtained and vitals that have been documented are patient reported.  VideoDeclined- This patient declined Librarian, academic. Therefore the visit was completed with audio only.  Persons Participating in Visit: Patient.  AWV Questionnaire: No: Patient  Medicare AWV questionnaire was not completed prior to this visit.  Cardiac Risk Factors include: advanced age (>21men, >14 women);hypertension;Other (see comment);diabetes mellitus;obesity (BMI >30kg/m2), Risk factor comments: afib     Objective:    Today's Vitals   10/14/23 1550  BP: 137/71  Weight: 184 lb (83.5 kg)  Height: 5' 5 (1.651 m)   Body mass index is 30.62 kg/m.     10/14/2023    3:48 PM 11/01/2020   10:00 PM 10/29/2020    2:50 PM 10/26/2020    4:11 PM 10/24/2020    6:52 PM 08/30/2020    7:46 PM 08/29/2020    4:28 PM  Advanced Directives  Does Patient Have a Medical Advance Directive? No No No No No No No  Would patient like information on creating a medical advance directive? No - Patient declined No - Patient declined   No - Patient declined  No - Patient declined    Current Medications (verified) Outpatient Encounter Medications as of 10/14/2023  Medication Sig   Accu-Chek Softclix Lancets lancets Use to obtain blood sugar sample Dx Code E11.40   apixaban  (ELIQUIS ) 2.5 MG TABS tablet Take 1 tablet (2.5 mg total) by mouth 2 (two) times daily.   cholestyramine  (QUESTRAN ) 4 g packet Take 1 packet (4 g total) by mouth daily.   dapagliflozin  propanediol (FARXIGA ) 10 MG TABS tablet Take 10 mg by mouth.   diltiazem  (CARDIZEM ) 30 MG tablet Take 1 tablet (30 mg total) by mouth 2 (two) times daily.   glipiZIDE  (GLUCOTROL  XL) 2.5 MG 24 hr tablet TAKE 1 TABLET TWICE DAILY (NEED MD APPOINTMENT)   Glucosamine-Chondroit-Biofl-Mn (GLUCOSAMINE CHONDROIT,BIOFLAV, PO) Take by mouth daily.   glucose blood (ACCU-CHEK GUIDE) test strip Use  to check blood sugar once daily Dx Code E11.40   losartan -hydrochlorothiazide  (HYZAAR) 100-25 MG tablet TAKE 1 TABLET EVERY DAY   metFORMIN  (GLUCOPHAGE -XR) 750 MG 24 hr tablet Take 1 tablet (750 mg total) by mouth daily with breakfast.   metoprolol  succinate (TOPROL -XL) 25 MG 24 hr tablet Take 0.5 tablets (12.5 mg total) by mouth daily.   Multiple  Vitamin (MULTIVITAMIN) tablet Take 1 tablet by mouth daily.   potassium chloride  (KLOR-CON  M) 10 MEQ tablet TAKE 1 TABLET EVERY DAY   venlafaxine  (EFFEXOR ) 37.5 MG tablet TAKE 1 TABLET EVERY DAY AT 12 NOON   Vitamin D -Vitamin K (VITAMIN K2-VITAMIN D3 PO) Take by mouth daily.   furosemide  (LASIX ) 20 MG tablet Take 20 mg by mouth every other day. (Patient not taking: Reported on 10/14/2023)   triamcinolone  cream (KENALOG ) 0.1 % Apply 1 Application topically 2 (two) times daily as needed. (Patient not taking: Reported on 10/14/2023)   No facility-administered encounter medications on file as of 10/14/2023.    Allergies (verified) Diphenhydramine, Diphenhydramine hcl, Diphenhydramine hcl, Doxycycline , Lisinopril, Sertraline hcl, Sertraline hcl, Fluoxetine hcl, and Fluoxetine hcl   History: Past Medical History:  Diagnosis Date   Allergic rhinitis    Anal fissure    Arthritis    Asthma    Depression    Diabetes mellitus    type 2   Diverticulosis    Fibromyalgia    GERD (gastroesophageal reflux disease)    Hypertension    Obesity    Osteoarthritis    Past Surgical History:  Procedure Laterality Date   ABDOMINAL HYSTERECTOMY  1970's   CATARACT EXTRACTION, BILATERAL     CHOLECYSTECTOMY  4/06   TUBAL LIGATION  1970's   VENTRAL HERNIA REPAIR  9/08   [facility]   Family History  Problem Relation Age of Onset   Heart attack Father    Coronary artery disease Father    Kidney cancer Mother    Lung cancer Brother    Liver cancer Brother    Diabetes Other        Maternal side   Brain cancer Daughter    Hypertension Neg Hx    Breast cancer Neg Hx    Colon cancer Neg Hx    Social History   Socioeconomic History   Marital status: Divorced    Spouse name: Not on file   Number of children: 3   Years of education: Not on file   Highest education level: Not on file  Occupational History   Occupation: Dentist: RETIRED  Tobacco Use   Smoking status: Never    Passive  exposure: Past   Smokeless tobacco: Never  Vaping Use   Vaping status: Never Used  Substance and Sexual Activity   Alcohol use: No    Alcohol/week: 0.0 standard drinks of alcohol   Drug use: Not on file   Sexual activity: Not on file  Other Topics Concern   Not on file  Social History Narrative   No living will   No health care POA but would want son, Ozell, to make decisions about her care if needed. Other child are alternatives   Would accept resuscitation attempts   Probably wouldn't want tube feeds if cognitively unaware   Social Drivers of Health   Financial Resource Strain: Low Risk  (10/14/2023)   Overall Financial Resource Strain (CARDIA)    Difficulty of Paying Living Expenses: Not hard at all  Food Insecurity: No Food Insecurity (10/14/2023)   Hunger Vital  Sign    Worried About Programme researcher, broadcasting/film/video in the Last Year: Never true    Ran Out of Food in the Last Year: Never true  Transportation Needs: No Transportation Needs (10/14/2023)   PRAPARE - Administrator, Civil Service (Medical): No    Lack of Transportation (Non-Medical): No  Physical Activity: Patient Declined (10/14/2023)   Exercise Vital Sign    Days of Exercise per Week: Patient declined    Minutes of Exercise per Session: Patient declined  Stress: No Stress Concern Present (10/14/2023)   [employer] of Occupational Health - Occupational Stress Questionnaire    Feeling of Stress: Not at all  Social Connections: Moderately Integrated (10/14/2023)   Social Connection and Isolation Panel    Frequency of Communication with Friends and Family: More than three times a week    Frequency of Social Gatherings with Friends and Family: More than three times a week    Attends Religious Services: More than 4 times per year    Active Member of [employer] or Organizations: Yes    Attends Banker Meetings: More than 4 times per year    Marital Status: Widowed    Tobacco Counseling Counseling given:  Not Answered    Clinical Intake:  Pre-visit preparation completed: Yes  Pain : No/denies pain     BMI - recorded: 30.62 Nutritional Status: BMI > 30  Obese Nutritional Risks: None Diabetes: No  Lab Results  Component Value Date   HGBA1C 6.0 (A) 08/17/2022   HGBA1C 6.5 (A) 02/06/2022   HGBA1C 6.2 (A) 04/03/2021     How often do you need to have someone help you when you read instructions, pamphlets, or other written materials from your doctor or pharmacy?: 1 - Never  Interpreter Needed?: No      Activities of Daily Living     10/14/2023    3:53 PM  In your present state of health, do you have any difficulty performing the following activities:  Hearing? 0  Vision? 0  Difficulty concentrating or making decisions? 1  Walking or climbing stairs? 1  Dressing or bathing? 0  Doing errands, shopping? 0  Preparing Food and eating ? N  Using the Toilet? N  In the past six months, have you accidently leaked urine? Y  Do you have problems with loss of bowel control? N  Managing your Medications? N  Managing your Finances? N  Housekeeping or managing your Housekeeping? N    Patient Care Team: Jimmy Charlie FERNS, MD as PCP - General Jacobo Evalene PARAS, MD as Consulting Physician (Oncology)  I have updated your Care Teams any recent Medical Services you may have received from other providers in the past year.     Assessment:   This is a routine wellness examination for Zilla.  Hearing/Vision screen Hearing Screening - Comments:: Patient has some difficulties  Vision Screening - Comments:: Patient wears glasses    Goals Addressed             This Visit's Progress    Patient Stated       Patient would like to get out more        Depression Screen     10/14/2023    3:54 PM 09/20/2023    1:38 PM 08/17/2022    3:51 PM 08/04/2021    4:43 PM 05/07/2020   12:23 PM 05/03/2019   12:14 PM 10/28/2018   12:58 PM  PHQ 2/9 Scores  PHQ - 2  Score 0 0 1  0 0   PHQ- 9  Score 0        Exception Documentation    Medical reason   Medical reason    Fall Risk     10/14/2023    3:52 PM 11/05/2022    3:54 PM 08/17/2022    3:51 PM 08/04/2021    4:43 PM 05/07/2020   12:23 PM  Fall Risk   Falls in the past year? 0 0 0 0 0  Number falls in past yr: 0 0 0    Injury with Fall? 0 0 0    Risk for fall due to : No Fall Risks No Fall Risks Impaired balance/gait    Follow up Falls evaluation completed Falls evaluation completed Falls evaluation completed      MEDICARE RISK AT HOME:  Medicare Risk at Home Any stairs in or around the home?: No If so, are there any without handrails?: No Home free of loose throw rugs in walkways, pet beds, electrical cords, etc?: Yes Adequate lighting in your home to reduce risk of falls?: Yes Life alert?: No Use of a cane, walker or w/c?: No Grab bars in the bathroom?: Yes Shower chair or bench in shower?: Yes Elevated toilet seat or a handicapped toilet?: Yes  TIMED UP AND GO:  Was the test performed?  No  Cognitive Function: 6CIT completed        10/14/2023    3:54 PM  6CIT Screen  What Year? 0 points  What month? 0 points  What time? 0 points  Count back from 20 0 points  Months in reverse 0 points  Repeat phrase 0 points  Total Score 0 points    Immunizations Immunization History  Administered Date(s) Administered   Fluad Quad(high Dose 65+) 10/28/2018, 11/03/2019, 12/10/2020, 02/06/2022   Fluad Trivalent(High Dose 65+) 11/05/2022   Influenza Split 12/25/2010   Influenza Whole 12/13/2006, 11/18/2007, 11/13/2008, 10/17/2009   Influenza,inj,Quad PF,6+ Mos 12/09/2012, 12/05/2013, 01/09/2015, 01/13/2016, 12/25/2016   Influenza-Unspecified 12/10/2017   PFIZER(Purple Top)SARS-COV-2 Vaccination 05/05/2019, 05/26/2019   Pneumococcal Conjugate-13 12/05/2013   Pneumococcal Polysaccharide-23 07/12/2015   Td 04/08/2005   Zoster, Live 08/27/2010, 07/25/2014    Screening Tests Health Maintenance  Topic Date Due    Zoster Vaccines- Shingrix (1 of 2) 08/29/1960   Diabetic kidney evaluation - Urine ACR  05/18/2009   DTaP/Tdap/Td (2 - Tdap) 04/09/2015   OPHTHALMOLOGY EXAM  06/12/2015   FOOT EXAM  05/07/2021   HEMOGLOBIN A1C  02/17/2023   Diabetic kidney evaluation - eGFR measurement  08/17/2023   Influenza Vaccine  09/10/2023   Medicare Annual Wellness (AWV)  10/13/2024   Pneumococcal Vaccine: 50+ Years  Completed   DEXA SCAN  Completed   HPV VACCINES  Aged Out   Meningococcal B Vaccine  Aged Out   COVID-19 Vaccine  Discontinued    Health Maintenance Items Addressed:   Additional Screening:  Vision Screening: Recommended annual ophthalmology exams for early detection of glaucoma and other disorders of the eye. Would you like a referral to an eye doctor? No    Dental Screening: Recommended annual dental exams for proper oral hygiene  Community Resource Referral / Chronic Care Management: CRR required this visit?  No   CCM required this visit?  No   Plan:    I have personally reviewed and noted the following in the patient's chart:   Medical and social history Use of alcohol, tobacco or illicit drugs  Current medications and supplements including opioid  prescriptions. Patient is not currently taking opioid prescriptions. Functional ability and status Nutritional status Physical activity Advanced directives List of other physicians Hospitalizations, surgeries, and ER visits in previous 12 months Vitals Screenings to include cognitive, depression, and falls Referrals and appointments  In addition, I have reviewed and discussed with patient certain preventive protocols, quality metrics, and best practice recommendations. A written personalized care plan for preventive services as well as general preventive health recommendations were provided to patient.   Lyle MARLA Right, [place]   10/15/2023   After Visit Summary: (MyChart) Due to this being a telephonic visit, the after visit  summary with patients personalized plan was offered to patient via MyChart   Notes: Nothing significant to report at this time.

## 2023-10-19 ENCOUNTER — Other Ambulatory Visit: Payer: Self-pay | Admitting: *Deleted

## 2023-10-19 DIAGNOSIS — D631 Anemia in chronic kidney disease: Secondary | ICD-10-CM

## 2023-10-19 LAB — INTELLIGEN MYELOID

## 2023-10-20 ENCOUNTER — Other Ambulatory Visit: Payer: Self-pay | Admitting: *Deleted

## 2023-10-20 ENCOUNTER — Other Ambulatory Visit: Payer: Self-pay

## 2023-10-20 ENCOUNTER — Inpatient Hospital Stay

## 2023-10-20 ENCOUNTER — Inpatient Hospital Stay: Attending: Oncology

## 2023-10-20 VITALS — BP 146/83

## 2023-10-20 DIAGNOSIS — N184 Chronic kidney disease, stage 4 (severe): Secondary | ICD-10-CM

## 2023-10-20 DIAGNOSIS — N1832 Chronic kidney disease, stage 3b: Secondary | ICD-10-CM | POA: Diagnosis present

## 2023-10-20 DIAGNOSIS — Z79899 Other long term (current) drug therapy: Secondary | ICD-10-CM | POA: Diagnosis not present

## 2023-10-20 DIAGNOSIS — D631 Anemia in chronic kidney disease: Secondary | ICD-10-CM | POA: Insufficient documentation

## 2023-10-20 LAB — CBC WITH DIFFERENTIAL/PLATELET
Abs Immature Granulocytes: 0.1 K/uL — ABNORMAL HIGH (ref 0.00–0.07)
Basophils Absolute: 0 K/uL (ref 0.0–0.1)
Basophils Relative: 0 %
Eosinophils Absolute: 0.3 K/uL (ref 0.0–0.5)
Eosinophils Relative: 3 %
HCT: 21.7 % — ABNORMAL LOW (ref 36.0–46.0)
Hemoglobin: 6.8 g/dL — CL (ref 12.0–15.0)
Immature Granulocytes: 1 %
Lymphocytes Relative: 11 %
Lymphs Abs: 1.3 K/uL (ref 0.7–4.0)
MCH: 30.4 pg (ref 26.0–34.0)
MCHC: 31.3 g/dL (ref 30.0–36.0)
MCV: 96.9 fL (ref 80.0–100.0)
Monocytes Absolute: 0.7 K/uL (ref 0.1–1.0)
Monocytes Relative: 6 %
Neutro Abs: 9.1 K/uL — ABNORMAL HIGH (ref 1.7–7.7)
Neutrophils Relative %: 79 %
Platelets: 258 K/uL (ref 150–400)
RBC: 2.24 MIL/uL — ABNORMAL LOW (ref 3.87–5.11)
RDW: 15 % (ref 11.5–15.5)
WBC: 11.6 K/uL — ABNORMAL HIGH (ref 4.0–10.5)
nRBC: 0 % (ref 0.0–0.2)

## 2023-10-20 LAB — ABO/RH: ABO/RH(D): O POS

## 2023-10-20 MED ORDER — EPOETIN ALFA-EPBX 40000 UNIT/ML IJ SOLN
40000.0000 [IU] | Freq: Once | INTRAMUSCULAR | Status: AC
  Administered 2023-10-20: 40000 [IU] via SUBCUTANEOUS
  Filled 2023-10-20: qty 1

## 2023-10-21 ENCOUNTER — Inpatient Hospital Stay

## 2023-10-21 ENCOUNTER — Inpatient Hospital Stay (HOSPITAL_BASED_OUTPATIENT_CLINIC_OR_DEPARTMENT_OTHER): Admitting: Oncology

## 2023-10-21 DIAGNOSIS — N1832 Chronic kidney disease, stage 3b: Secondary | ICD-10-CM | POA: Diagnosis not present

## 2023-10-21 DIAGNOSIS — N184 Chronic kidney disease, stage 4 (severe): Secondary | ICD-10-CM

## 2023-10-21 DIAGNOSIS — D631 Anemia in chronic kidney disease: Secondary | ICD-10-CM | POA: Diagnosis not present

## 2023-10-21 LAB — PREPARE RBC (CROSSMATCH)

## 2023-10-21 MED ORDER — SODIUM CHLORIDE 0.9% IV SOLUTION
250.0000 mL | INTRAVENOUS | Status: DC
  Administered 2023-10-21: 100 mL via INTRAVENOUS
  Filled 2023-10-21: qty 250

## 2023-10-21 MED ORDER — ACETAMINOPHEN 325 MG PO TABS
650.0000 mg | ORAL_TABLET | Freq: Once | ORAL | Status: AC
  Administered 2023-10-21: 650 mg via ORAL
  Filled 2023-10-21: qty 2

## 2023-10-21 NOTE — Progress Notes (Signed)
 Patient here today for follow up regarding anemia, blood transfusion. MD will see patient during infusion.

## 2023-10-21 NOTE — Progress Notes (Signed)
 [facility]  [phone] [phone]  ID: [name] OB: [birth year]  MR#: [id number]  RDW#:[id number]  Patient Care Team: Jimmy Charlie FERNS, MD as PCP - General Jacobo Evalene PARAS, MD as Consulting Physician (Oncology)  CHIEF COMPLAINT: Anemia and chronic renal insufficiency and possible underlying MDS.  INTERVAL HISTORY: Patient returns to clinic today as an add-on after laboratory work noted decreased hemoglobin of 6.8 and patient more symptomatic with fatigue.  She otherwise feels well.  She has no neurologic complaints.  She denies any recent fevers or illnesses.  She has a good appetite and denies weight loss.  She has no chest pain, shortness of breath, cough, or hemoptysis.  She denies any nausea, vomiting, constipation, or diarrhea.  She has no melena or hematochezia.  She has no urinary complaints.  Patient offers no further specific complaints today.  REVIEW OF SYSTEMS:   Review of Systems  Constitutional:  Positive for malaise/fatigue. Negative for chills and fever.  Respiratory: Negative.  Negative for cough, hemoptysis and shortness of breath.   Cardiovascular: Negative.  Negative for chest pain and leg swelling.  Gastrointestinal: Negative.  Negative for abdominal pain, blood in stool and melena.  Genitourinary: Negative.  Negative for dysuria.  Musculoskeletal: Negative.  Negative for back pain.  Skin: Negative.  Negative for rash.  Neurological:  Positive for weakness. Negative for dizziness, focal weakness and headaches.  Psychiatric/Behavioral: Negative.  The patient is not nervous/anxious.     As per HPI. Otherwise, a complete review of systems is negative.  PAST MEDICAL HISTORY: Past Medical History:  Diagnosis Date   Allergic rhinitis    Anal fissure    Arthritis    Asthma    Depression    Diabetes mellitus    type 2   Diverticulosis    Fibromyalgia    GERD (gastroesophageal reflux disease)    Hypertension     Obesity    Osteoarthritis     PAST SURGICAL HISTORY: Past Surgical History:  Procedure Laterality Date   ABDOMINAL HYSTERECTOMY  1970's   CATARACT EXTRACTION, BILATERAL     CHOLECYSTECTOMY  4/06   TUBAL LIGATION  1970's   VENTRAL HERNIA REPAIR  9/08   [facility]    FAMILY HISTORY: Family History  Problem Relation Age of Onset   Heart attack Father    Coronary artery disease Father    Kidney cancer Mother    Lung cancer Brother    Liver cancer Brother    Diabetes Other        Maternal side   Brain cancer Daughter    Hypertension Neg Hx    Breast cancer Neg Hx    Colon cancer Neg Hx     ADVANCED DIRECTIVES (Y/N):  N  HEALTH MAINTENANCE: Social History   Tobacco Use   Smoking status: Never    Passive exposure: Past   Smokeless tobacco: Never  Vaping Use   Vaping status: Never Used  Substance Use Topics   Alcohol use: No    Alcohol/week: 0.0 standard drinks of alcohol     Colonoscopy:  PAP:  Bone density:  Lipid panel:  Allergies  Allergen Reactions   Diphenhydramine Other (See Comments)    hallucinations   Diphenhydramine Hcl Other (See Comments)    hallucinations    Diphenhydramine Hcl Other (See Comments)    Other reaction(s): Other (See Comments)  hallucinations   Doxycycline  Other (See Comments)    GI side effects   GI side effects  GI side effects   Lisinopril Other (See Comments)    REACTION: dizzy  REACTION: dizzy  REACTION: dizzy  REACTION: dizzy REACTION: dizzy    REACTION: dizzy   Sertraline Hcl Other (See Comments)    REACTION: dizziness REACTION: dizziness    REACTION: dizziness   Sertraline Hcl Other (See Comments)    REACTION: dizziness  REACTION: dizziness   Fluoxetine Hcl Palpitations    REACTION: raised blood pressure REACTION: raised blood pressure    REACTION: raised blood pressure   Fluoxetine Hcl Palpitations    REACTION: raised blood pressure  REACTION: raised blood pressure    Current Outpatient  Medications  Medication Sig Dispense Refill   Accu-Chek Softclix Lancets lancets Use to obtain blood sugar sample Dx Code E11.40 100 each 3   apixaban  (ELIQUIS ) 2.5 MG TABS tablet Take 1 tablet (2.5 mg total) by mouth 2 (two) times daily. 28 tablet    cholestyramine  (QUESTRAN ) 4 g packet Take 1 packet (4 g total) by mouth daily. 90 each 3   dapagliflozin  propanediol (FARXIGA ) 10 MG TABS tablet Take 10 mg by mouth.     diltiazem  (CARDIZEM ) 30 MG tablet Take 1 tablet (30 mg total) by mouth 2 (two) times daily. 180 tablet 3   furosemide  (LASIX ) 20 MG tablet Take 20 mg by mouth every other day. (Patient not taking: Reported on 10/14/2023)     glipiZIDE  (GLUCOTROL  XL) 2.5 MG 24 hr tablet TAKE 1 TABLET TWICE DAILY (NEED MD APPOINTMENT) 180 tablet 0   Glucosamine-Chondroit-Biofl-Mn (GLUCOSAMINE CHONDROIT,BIOFLAV, PO) Take by mouth daily.     glucose blood (ACCU-CHEK GUIDE) test strip Use to check blood sugar once daily Dx Code E11.40 100 each 3   losartan -hydrochlorothiazide  (HYZAAR) 100-25 MG tablet TAKE 1 TABLET EVERY DAY 90 tablet 3   metFORMIN  (GLUCOPHAGE -XR) 750 MG 24 hr tablet Take 1 tablet (750 mg total) by mouth daily with breakfast. 180 tablet 3   metoprolol  succinate (TOPROL -XL) 25 MG 24 hr tablet Take 0.5 tablets (12.5 mg total) by mouth daily. 90 tablet 3   Multiple Vitamin (MULTIVITAMIN) tablet Take 1 tablet by mouth daily.     potassium chloride  (KLOR-CON  M) 10 MEQ tablet TAKE 1 TABLET EVERY DAY 90 tablet 2   triamcinolone  cream (KENALOG ) 0.1 % Apply 1 Application topically 2 (two) times daily as needed. (Patient not taking: Reported on 10/14/2023) 45 g 1   venlafaxine  (EFFEXOR ) 37.5 MG tablet TAKE 1 TABLET EVERY DAY AT 12 NOON 90 tablet 3   Vitamin D -Vitamin K (VITAMIN K2-VITAMIN D3 PO) Take by mouth daily.     No current facility-administered medications for this visit.   Facility-Administered Medications Ordered in Other Visits  Medication Dose Route Frequency Provider Last Rate Last  Admin   0.9 %  sodium chloride  infusion (Manually program via Guardrails IV Fluids)  250 mL Intravenous Continuous Jacobo Evalene PARAS, MD   Stopped at 10/21/23 1234    OBJECTIVE: There were no vitals filed for this visit.    There is no height or weight on file to calculate BMI.    ECOG FS:1 - Symptomatic but completely ambulatory  General: Well-developed, well-nourished, no acute distress. Eyes: Pink conjunctiva, anicteric sclera. HEENT: Normocephalic, moist mucous membranes. Lungs: No audible wheezing or coughing. Heart: Regular rate and rhythm. Abdomen: Soft, nontender, no obvious distention. Musculoskeletal: No edema, cyanosis, or clubbing. Neuro: Alert, answering all questions appropriately. Cranial nerves grossly intact. Skin: No rashes or petechiae noted. Psych: Normal affect.   LAB RESULTS:  Lab Results  Component Value Date   NA 134 (L) 08/17/2022   K 4.8 08/17/2022   CL 100 08/17/2022   CO2 26 08/17/2022   GLUCOSE 173 (H) 08/17/2022   BUN 27 (H) 08/17/2022   CREATININE 1.62 (H) 08/17/2022   CALCIUM 9.3 08/17/2022   PROT 7.2 08/17/2022   ALBUMIN 3.8 08/17/2022   ALBUMIN 3.8 08/17/2022   AST 17 08/17/2022   ALT 10 08/17/2022   ALKPHOS 54 08/17/2022   BILITOT 0.6 08/17/2022   GFRNONAA >60 11/06/2020   GFRAA 81 11/18/2007    Lab Results  Component Value Date   WBC 11.6 (H) 10/20/2023   NEUTROABS 9.1 (H) 10/20/2023   HGB 6.8 (LL) 10/20/2023   HCT 21.7 (L) 10/20/2023   MCV 96.9 10/20/2023   PLT 258 10/20/2023   Lab Results  Component Value Date   IRON 92 09/20/2023   TIBC 382 09/20/2023   IRONPCTSAT 24 09/20/2023   Lab Results  Component Value Date   FERRITIN 28 09/20/2023     STUDIES: No results found.  ASSESSMENT: Anemia and chronic renal insufficiency and possible underlying MDS.  PLAN:    Anemia secondary to chronic renal insufficiency: Despite Retacrit , patient hemoglobin has declined to 6.8 and patient is receiving 1 unit packed red  blood cells today.  Previously, iron stores, B12, folate, SPEP, and hemolysis labs were all either negative or within normal limits.  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  Patient does not require bone marrow biopsy at this point, but would consider 1 in the future if necessary.   Return to clinic in 2 weeks for repeat laboratory work, further evaluation, and continuation of Retacrit  and/or transfusion.   Renal insufficiency: Chronic and unchanged.  Continue monitoring treatment per nephrology.  I spent a total of 30 minutes reviewing chart data, face-to-face evaluation with the patient, counseling and coordination of care as detailed above.   Patient expressed understanding and was in agreement with this plan. She also understands that She can call clinic at any time with any questions, concerns, or complaints.     Evalene JINNY Reusing, MD   10/21/2023 12:53 PM

## 2023-10-22 LAB — TYPE AND SCREEN
ABO/RH(D): O POS
Antibody Screen: POSITIVE
Donor AG Type: NEGATIVE
Unit division: 0

## 2023-10-22 LAB — BPAM RBC
Blood Product Expiration Date: 202510102359
ISSUE DATE / TIME: 202509111031
Unit Type and Rh: 5100

## 2023-10-24 ENCOUNTER — Other Ambulatory Visit: Payer: Self-pay | Admitting: Cardiovascular Disease

## 2023-11-04 ENCOUNTER — Encounter: Payer: Self-pay | Admitting: Oncology

## 2023-11-04 ENCOUNTER — Inpatient Hospital Stay

## 2023-11-04 ENCOUNTER — Inpatient Hospital Stay (HOSPITAL_BASED_OUTPATIENT_CLINIC_OR_DEPARTMENT_OTHER): Admitting: Oncology

## 2023-11-04 ENCOUNTER — Other Ambulatory Visit: Payer: Self-pay | Admitting: *Deleted

## 2023-11-04 VITALS — BP 139/69 | HR 87 | Temp 98.6°F | Resp 18

## 2023-11-04 DIAGNOSIS — D631 Anemia in chronic kidney disease: Secondary | ICD-10-CM

## 2023-11-04 DIAGNOSIS — N1832 Chronic kidney disease, stage 3b: Secondary | ICD-10-CM | POA: Diagnosis not present

## 2023-11-04 DIAGNOSIS — N184 Chronic kidney disease, stage 4 (severe): Secondary | ICD-10-CM | POA: Diagnosis not present

## 2023-11-04 LAB — CBC WITH DIFFERENTIAL/PLATELET
Abs Immature Granulocytes: 0.1 K/uL — ABNORMAL HIGH (ref 0.00–0.07)
Basophils Absolute: 0.1 K/uL (ref 0.0–0.1)
Basophils Relative: 1 %
Eosinophils Absolute: 0.4 K/uL (ref 0.0–0.5)
Eosinophils Relative: 3 %
HCT: 24.8 % — ABNORMAL LOW (ref 36.0–46.0)
Hemoglobin: 7.9 g/dL — ABNORMAL LOW (ref 12.0–15.0)
Immature Granulocytes: 1 %
Lymphocytes Relative: 11 %
Lymphs Abs: 1.2 K/uL (ref 0.7–4.0)
MCH: 30.7 pg (ref 26.0–34.0)
MCHC: 31.9 g/dL (ref 30.0–36.0)
MCV: 96.5 fL (ref 80.0–100.0)
Monocytes Absolute: 0.7 K/uL (ref 0.1–1.0)
Monocytes Relative: 7 %
Neutro Abs: 8.1 K/uL — ABNORMAL HIGH (ref 1.7–7.7)
Neutrophils Relative %: 77 %
Platelets: 252 K/uL (ref 150–400)
RBC: 2.57 MIL/uL — ABNORMAL LOW (ref 3.87–5.11)
RDW: 15.1 % (ref 11.5–15.5)
WBC: 10.5 K/uL (ref 4.0–10.5)
nRBC: 0 % (ref 0.0–0.2)

## 2023-11-04 MED ORDER — EPOETIN ALFA-EPBX 40000 UNIT/ML IJ SOLN
40000.0000 [IU] | Freq: Once | INTRAMUSCULAR | Status: AC
  Administered 2023-11-04: 40000 [IU] via SUBCUTANEOUS
  Filled 2023-11-04: qty 1

## 2023-11-04 NOTE — Progress Notes (Signed)
 [facility]  [phone] [phone]  ID: [name] OB: 04-01-1940  MR#: [id number]  RDW#:[id number]  Patient Care Team: Jimmy Charlie FERNS, MD as PCP - General Jacobo Evalene PARAS, MD as Consulting Physician (Oncology)  CHIEF COMPLAINT: Anemia and chronic renal insufficiency and possible underlying MDS.  INTERVAL HISTORY: Patient returns to clinic today for repeat laboratory work, further evaluation, consideration of additional Retacrit  and blood transfusion.  She continues to have significant fatigue, but admits this is mildly improved since receiving transfusion 2 weeks ago.  She otherwise feels well.  She has no neurologic complaints.  She denies any recent fevers or illnesses.  She has a good appetite and denies weight loss.  She has no chest pain, shortness of breath, cough, or hemoptysis.  She denies any nausea, vomiting, constipation, or diarrhea.  She has no melena or hematochezia.  She has no urinary complaints.  Patient offers no further specific complaints today.  REVIEW OF SYSTEMS:   Review of Systems  Constitutional:  Positive for malaise/fatigue. Negative for chills and fever.  Respiratory: Negative.  Negative for cough, hemoptysis and shortness of breath.   Cardiovascular: Negative.  Negative for chest pain and leg swelling.  Gastrointestinal: Negative.  Negative for abdominal pain, blood in stool and melena.  Genitourinary: Negative.  Negative for dysuria.  Musculoskeletal: Negative.  Negative for back pain.  Skin: Negative.  Negative for rash.  Neurological:  Positive for weakness. Negative for dizziness, focal weakness and headaches.  Psychiatric/Behavioral: Negative.  The patient is not nervous/anxious.     As per HPI. Otherwise, a complete review of systems is negative.  PAST MEDICAL HISTORY: Past Medical History:  Diagnosis Date   Allergic rhinitis    Anal fissure    Arthritis    Asthma    Depression    Diabetes  mellitus    type 2   Diverticulosis    Fibromyalgia    GERD (gastroesophageal reflux disease)    Hypertension    Obesity    Osteoarthritis     PAST SURGICAL HISTORY: Past Surgical History:  Procedure Laterality Date   ABDOMINAL HYSTERECTOMY  1970's   CATARACT EXTRACTION, BILATERAL     CHOLECYSTECTOMY  4/06   TUBAL LIGATION  1970's   VENTRAL HERNIA REPAIR  9/08   [facility]    FAMILY HISTORY: Family History  Problem Relation Age of Onset   Heart attack Father    Coronary artery disease Father    Kidney cancer Mother    Lung cancer Brother    Liver cancer Brother    Diabetes Other        Maternal side   Brain cancer Daughter    Hypertension Neg Hx    Breast cancer Neg Hx    Colon cancer Neg Hx     ADVANCED DIRECTIVES (Y/N):  N  HEALTH MAINTENANCE: Social History   Tobacco Use   Smoking status: Never    Passive exposure: Past   Smokeless tobacco: Never  Vaping Use   Vaping status: Never Used  Substance Use Topics   Alcohol use: No    Alcohol/week: 0.0 standard drinks of alcohol     Colonoscopy:  PAP:  Bone density:  Lipid panel:  Allergies  Allergen Reactions   Diphenhydramine  Other (See Comments)    hallucinations   Diphenhydramine  Hcl Other (See Comments)    hallucinations    Diphenhydramine  Hcl Other (See Comments)    Other reaction(s): Other (See Comments)  hallucinations   Doxycycline  Other (  See Comments)    GI side effects   GI side effects     GI side effects   Lisinopril Other (See Comments)    REACTION: dizzy  REACTION: dizzy  REACTION: dizzy  REACTION: dizzy REACTION: dizzy    REACTION: dizzy   Sertraline Hcl Other (See Comments)    REACTION: dizziness REACTION: dizziness    REACTION: dizziness   Sertraline Hcl Other (See Comments)    REACTION: dizziness  REACTION: dizziness   Fluoxetine Hcl Palpitations    REACTION: raised blood pressure REACTION: raised blood pressure    REACTION: raised blood pressure   Fluoxetine Hcl  Palpitations    REACTION: raised blood pressure  REACTION: raised blood pressure    Current Outpatient Medications  Medication Sig Dispense Refill   Accu-Chek Softclix Lancets lancets Use to obtain blood sugar sample Dx Code E11.40 100 each 3   apixaban  (ELIQUIS ) 2.5 MG TABS tablet Take 1 tablet (2.5 mg total) by mouth 2 (two) times daily. 28 tablet    cholestyramine  (QUESTRAN ) 4 g packet Take 1 packet (4 g total) by mouth daily. 90 each 3   dapagliflozin  propanediol (FARXIGA ) 10 MG TABS tablet Take 10 mg by mouth.     diltiazem  (CARDIZEM ) 30 MG tablet TAKE 1 TABLET TWICE DAILY 180 tablet 3   glipiZIDE  (GLUCOTROL  XL) 2.5 MG 24 hr tablet TAKE 1 TABLET TWICE DAILY (NEED MD APPOINTMENT) 180 tablet 0   Glucosamine-Chondroit-Biofl-Mn (GLUCOSAMINE CHONDROIT,BIOFLAV, PO) Take by mouth daily.     glucose blood (ACCU-CHEK GUIDE) test strip Use to check blood sugar once daily Dx Code E11.40 100 each 3   losartan -hydrochlorothiazide  (HYZAAR) 100-25 MG tablet TAKE 1 TABLET EVERY DAY 90 tablet 3   metFORMIN  (GLUCOPHAGE -XR) 750 MG 24 hr tablet Take 1 tablet (750 mg total) by mouth daily with breakfast. 180 tablet 3   metoprolol  succinate (TOPROL -XL) 25 MG 24 hr tablet Take 0.5 tablets (12.5 mg total) by mouth daily. 90 tablet 3   Multiple Vitamin (MULTIVITAMIN) tablet Take 1 tablet by mouth daily.     potassium chloride  (KLOR-CON  M) 10 MEQ tablet TAKE 1 TABLET EVERY DAY 90 tablet 2   venlafaxine  (EFFEXOR ) 37.5 MG tablet TAKE 1 TABLET EVERY DAY AT 12 NOON 90 tablet 3   Vitamin D -Vitamin K (VITAMIN K2-VITAMIN D3 PO) Take by mouth daily.     furosemide  (LASIX ) 20 MG tablet Take 20 mg by mouth every other day. (Patient not taking: Reported on 11/04/2023)     triamcinolone  cream (KENALOG ) 0.1 % Apply 1 Application topically 2 (two) times daily as needed. (Patient not taking: Reported on 11/04/2023) 45 g 1   No current facility-administered medications for this visit.    OBJECTIVE: Vitals:   11/04/23 1040   BP: 139/69  Pulse: 87  Resp: 18  Temp: 98.6 F (37 C)  SpO2: 97%      There is no height or weight on file to calculate BMI.    ECOG FS:1 - Symptomatic but completely ambulatory  General: Well-developed, well-nourished, no acute distress.  Sitting in a wheelchair. Eyes: Pink conjunctiva, anicteric sclera. HEENT: Normocephalic, moist mucous membranes. Lungs: No audible wheezing or coughing. Heart: Regular rate and rhythm. Abdomen: Soft, nontender, no obvious distention. Musculoskeletal: No edema, cyanosis, or clubbing. Neuro: Alert, answering all questions appropriately. Cranial nerves grossly intact. Skin: No rashes or petechiae noted. Psych: Normal affect.   LAB RESULTS:  Lab Results  Component Value Date   NA 134 (L) 08/17/2022   K 4.8 08/17/2022  CL 100 08/17/2022   CO2 26 08/17/2022   GLUCOSE 173 (H) 08/17/2022   BUN 27 (H) 08/17/2022   CREATININE 1.62 (H) 08/17/2022   CALCIUM 9.3 08/17/2022   PROT 7.2 08/17/2022   ALBUMIN 3.8 08/17/2022   ALBUMIN 3.8 08/17/2022   AST 17 08/17/2022   ALT 10 08/17/2022   ALKPHOS 54 08/17/2022   BILITOT 0.6 08/17/2022   GFRNONAA >60 11/06/2020   GFRAA 81 11/18/2007    Lab Results  Component Value Date   WBC 10.5 11/04/2023   NEUTROABS 8.1 (H) 11/04/2023   HGB 7.9 (L) 11/04/2023   HCT 24.8 (L) 11/04/2023   MCV 96.5 11/04/2023   PLT 252 11/04/2023   Lab Results  Component Value Date   IRON 92 09/20/2023   TIBC 382 09/20/2023   IRONPCTSAT 24 09/20/2023   Lab Results  Component Value Date   FERRITIN 28 09/20/2023     STUDIES: No results found.  ASSESSMENT: Anemia and chronic renal insufficiency and possible underlying MDS.  PLAN:    Anemia secondary to chronic renal insufficiency: Patient's hemoglobin has improved to 7.9 with 1 unit of packed red blood cells.  Previously, iron stores, B12, folate, SPEP, and hemolysis labs were all either negative or within normal limits.  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  Patient does not require bone marrow biopsy at this point, but would consider one in the future if necessary.  Proceed with 40,000 units Retacrit  today.  Return to clinic tomorrow for 1 unit of packed red blood cells.  Patient will then return to clinic in 1 month with repeat laboratory, further evaluation, and continuation of treatment if needed.   Renal insufficiency: Chronic and unchanged.  Continue monitoring treatment per nephrology.  I spent a total of 30 minutes reviewing chart data, face-to-face evaluation with the patient, counseling and coordination of care as detailed above.   Patient expressed understanding and was in agreement with this plan. She also understands that She can call clinic at any time with any questions, concerns, or complaints.     Evalene JINNY Reusing, MD   11/04/2023 2:43 PM

## 2023-11-04 NOTE — Progress Notes (Signed)
 Patient states she is feeling weak. Patient states after her last infusion she had some hot flashes.

## 2023-11-04 NOTE — Progress Notes (Signed)
 [name]                                          MRN: [id number]   11/04/2023   The [facility] Quality Team Specialist reviewed this patient medical record for the purposes of chart review for care gap closure. The following were reviewed: chart review for care gap closure-kidney health evaluation for diabetes:eGFR  and uACR.    [facility] Quality Team

## 2023-11-05 ENCOUNTER — Inpatient Hospital Stay

## 2023-11-05 VITALS — BP 146/74 | HR 72 | Temp 97.1°F | Resp 16

## 2023-11-05 DIAGNOSIS — D631 Anemia in chronic kidney disease: Secondary | ICD-10-CM

## 2023-11-05 DIAGNOSIS — N1832 Chronic kidney disease, stage 3b: Secondary | ICD-10-CM | POA: Diagnosis not present

## 2023-11-05 LAB — PREPARE RBC (CROSSMATCH)

## 2023-11-05 MED ORDER — ACETAMINOPHEN 325 MG PO TABS
650.0000 mg | ORAL_TABLET | Freq: Once | ORAL | Status: AC
  Administered 2023-11-05: 650 mg via ORAL
  Filled 2023-11-05: qty 2

## 2023-11-05 MED ORDER — SODIUM CHLORIDE 0.9% IV SOLUTION
250.0000 mL | INTRAVENOUS | Status: DC
  Administered 2023-11-05: 100 mL via INTRAVENOUS
  Filled 2023-11-05: qty 250

## 2023-11-05 MED ORDER — LORATADINE 10 MG PO TABS
10.0000 mg | ORAL_TABLET | Freq: Once | ORAL | Status: AC
  Administered 2023-11-05: 10 mg via ORAL
  Filled 2023-11-05: qty 1

## 2023-11-05 MED ORDER — DIPHENHYDRAMINE HCL 50 MG/ML IJ SOLN: 25.0000 mg | Freq: Once | INTRAMUSCULAR | Status: DC

## 2023-11-05 MED ORDER — CETIRIZINE HCL 10 MG PO TABS
10.0000 mg | ORAL_TABLET | Freq: Once | ORAL | Status: DC
  Filled 2023-11-05: qty 1

## 2023-11-05 NOTE — Patient Instructions (Signed)

## 2023-11-06 LAB — TYPE AND SCREEN
ABO/RH(D): O POS
Antibody Screen: POSITIVE
Donor AG Type: NEGATIVE
Unit division: 0

## 2023-11-06 LAB — BPAM RBC
Blood Product Expiration Date: 202511052359
ISSUE DATE / TIME: 202509261035
Unit Type and Rh: 5100

## 2023-11-18 ENCOUNTER — Other Ambulatory Visit

## 2023-11-18 ENCOUNTER — Ambulatory Visit

## 2023-12-06 ENCOUNTER — Inpatient Hospital Stay

## 2023-12-06 ENCOUNTER — Inpatient Hospital Stay: Admitting: Oncology

## 2023-12-06 ENCOUNTER — Other Ambulatory Visit

## 2023-12-06 ENCOUNTER — Ambulatory Visit

## 2023-12-06 ENCOUNTER — Ambulatory Visit: Admitting: Oncology

## 2023-12-07 ENCOUNTER — Encounter

## 2023-12-07 ENCOUNTER — Inpatient Hospital Stay

## 2023-12-10 ENCOUNTER — Encounter

## 2023-12-14 ENCOUNTER — Inpatient Hospital Stay

## 2023-12-14 ENCOUNTER — Inpatient Hospital Stay: Admitting: Oncology

## 2023-12-14 ENCOUNTER — Inpatient Hospital Stay: Attending: Oncology

## 2023-12-15 ENCOUNTER — Inpatient Hospital Stay

## 2023-12-16 ENCOUNTER — Ambulatory Visit

## 2023-12-16 ENCOUNTER — Other Ambulatory Visit

## 2024-01-10 ENCOUNTER — Other Ambulatory Visit: Payer: Self-pay | Admitting: Family

## 2024-01-11 ENCOUNTER — Ambulatory Visit: Admitting: Oncology

## 2024-01-11 ENCOUNTER — Ambulatory Visit

## 2024-01-11 ENCOUNTER — Other Ambulatory Visit

## 2024-01-11 MED ORDER — VENLAFAXINE HCL 37.5 MG PO TABS: 37.5000 mg | ORAL_TABLET | Freq: Every day | ORAL | 0 refills | Status: DC

## 2024-01-11 MED ORDER — LOSARTAN POTASSIUM-HCTZ 100-25 MG PO TABS: 1.0000 | ORAL_TABLET | Freq: Every day | ORAL | 0 refills | Status: AC

## 2024-01-11 NOTE — Addendum Note (Signed)
 Addended by: KALLIE CLOTILDA SQUIBB on: 01/11/2024 12:37 PM   Modules accepted: Orders

## 2024-02-08 ENCOUNTER — Encounter

## 2024-03-17 ENCOUNTER — Telehealth: Payer: Self-pay | Admitting: Internal Medicine

## 2024-03-17 ENCOUNTER — Encounter

## 2024-03-17 MED ORDER — GLIPIZIDE ER 2.5 MG PO TB24: 2.5000 mg | ORAL_TABLET | Freq: Two times a day (BID) | ORAL | 0 refills | Status: AC

## 2024-03-17 MED ORDER — VENLAFAXINE HCL 37.5 MG PO TABS: 37.5000 mg | ORAL_TABLET | Freq: Every day | ORAL | 0 refills | Status: AC

## 2024-03-17 NOTE — Telephone Encounter (Signed)
 venlafaxine  (EFFEXOR ) 37.5 MG tablet  glipiZIDE  (GLUCOTROL  XL) 2.5 MG 24 hr tablet   Pt would like her medications to be refilled

## 2024-03-17 NOTE — Telephone Encounter (Signed)
 Rx sent electronically. To [facility].

## 2024-05-16 ENCOUNTER — Encounter
